# Patient Record
Sex: Female | Born: 1945 | Race: White | Hispanic: No | Marital: Married | Smoking: Never smoker
Health system: Southern US, Community
[De-identification: ages and names within clinical notes are randomized; demographics above are authoritative.]

## PROBLEM LIST (undated history)

## (undated) DIAGNOSIS — I10 Essential (primary) hypertension: Secondary | ICD-10-CM

## (undated) DIAGNOSIS — Z9119 Patient's noncompliance with other medical treatment and regimen: Secondary | ICD-10-CM

## (undated) DIAGNOSIS — K3532 Acute appendicitis with perforation and localized peritonitis, without abscess: Secondary | ICD-10-CM

## (undated) DIAGNOSIS — E119 Type 2 diabetes mellitus without complications: Secondary | ICD-10-CM

## (undated) DIAGNOSIS — D509 Iron deficiency anemia, unspecified: Secondary | ICD-10-CM

## (undated) DIAGNOSIS — E7800 Pure hypercholesterolemia, unspecified: Secondary | ICD-10-CM

## (undated) DIAGNOSIS — Z992 Dependence on renal dialysis: Secondary | ICD-10-CM

## (undated) DIAGNOSIS — N289 Disorder of kidney and ureter, unspecified: Secondary | ICD-10-CM

## (undated) DIAGNOSIS — K219 Gastro-esophageal reflux disease without esophagitis: Secondary | ICD-10-CM

## (undated) DIAGNOSIS — N390 Urinary tract infection, site not specified: Secondary | ICD-10-CM

## (undated) DIAGNOSIS — Z91199 Patient's noncompliance with other medical treatment and regimen due to unspecified reason: Secondary | ICD-10-CM

## (undated) DIAGNOSIS — L988 Other specified disorders of the skin and subcutaneous tissue: Secondary | ICD-10-CM

## (undated) HISTORY — PX: ABDOMINAL HYSTERECTOMY: SHX81

## (undated) HISTORY — PX: OTHER SURGICAL HISTORY: SHX169

## (undated) HISTORY — PX: TONSILLECTOMY: SUR1361

## (undated) SURGERY — Surgical Case
Anesthesia: *Unknown

---

## 2000-12-26 ENCOUNTER — Ambulatory Visit (HOSPITAL_BASED_OUTPATIENT_CLINIC_OR_DEPARTMENT_OTHER): Admission: RE | Admit: 2000-12-26 | Discharge: 2000-12-26 | Payer: Self-pay | Admitting: *Deleted

## 2003-07-24 ENCOUNTER — Emergency Department (HOSPITAL_COMMUNITY): Admission: EM | Admit: 2003-07-24 | Discharge: 2003-07-24 | Payer: Self-pay | Admitting: Emergency Medicine

## 2006-02-03 ENCOUNTER — Emergency Department (HOSPITAL_COMMUNITY): Admission: EM | Admit: 2006-02-03 | Discharge: 2006-02-03 | Payer: Self-pay | Admitting: Emergency Medicine

## 2006-02-09 ENCOUNTER — Encounter (HOSPITAL_COMMUNITY): Admission: RE | Admit: 2006-02-09 | Discharge: 2006-03-11 | Payer: Self-pay | Admitting: Orthopaedic Surgery

## 2006-02-11 ENCOUNTER — Emergency Department (HOSPITAL_COMMUNITY): Admission: EM | Admit: 2006-02-11 | Discharge: 2006-02-11 | Payer: Self-pay | Admitting: *Deleted

## 2006-03-13 ENCOUNTER — Encounter (HOSPITAL_COMMUNITY): Admission: RE | Admit: 2006-03-13 | Discharge: 2006-04-12 | Payer: Self-pay | Admitting: Orthopaedic Surgery

## 2006-05-01 HISTORY — PX: APPENDECTOMY: SHX54

## 2007-03-16 ENCOUNTER — Inpatient Hospital Stay (HOSPITAL_COMMUNITY): Admission: EM | Admit: 2007-03-16 | Discharge: 2007-04-15 | Payer: Self-pay | Admitting: Emergency Medicine

## 2007-03-22 ENCOUNTER — Encounter (INDEPENDENT_AMBULATORY_CARE_PROVIDER_SITE_OTHER): Payer: Self-pay | Admitting: General Surgery

## 2007-05-02 HISTORY — PX: OTHER SURGICAL HISTORY: SHX169

## 2007-06-24 ENCOUNTER — Ambulatory Visit (HOSPITAL_COMMUNITY): Admission: RE | Admit: 2007-06-24 | Discharge: 2007-06-24 | Payer: Self-pay | Admitting: General Surgery

## 2008-01-01 IMAGING — CT CT ABDOMEN W/ CM
1 of 3 series · 12 of 32 positions shown, 18 images · IV contrast (Omnipaque 300)
Comparison: 04/01/2007

CLINICAL DATA: 61-year-old female with anemia, urinary tract infection. 
 ABDOMEN CT WITH CONTRAST:
TECHNIQUE: Multidetector CT imaging of the abdomen was performed following the standard protocol during bolus administration of intravenous contrast.
 Contrast:  100 cc Omnipaque 300

[Series 2: abd_pel 5.0 b40f · axial · 0.74mm/px · z∈[-322,-52]mm · 12 of 64 slices shown, 18 images]
[im 5/64  soft-tissue]
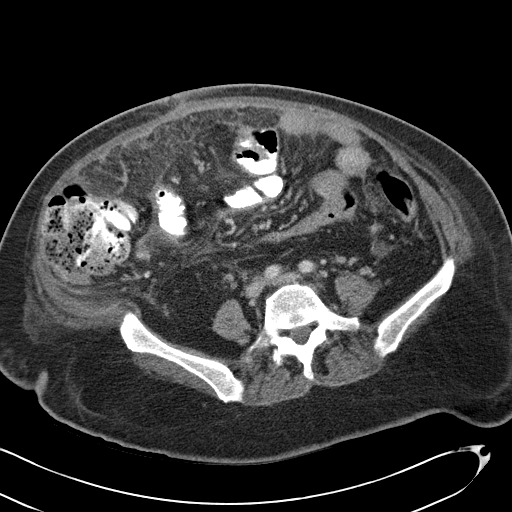
[im 5/64  bone]
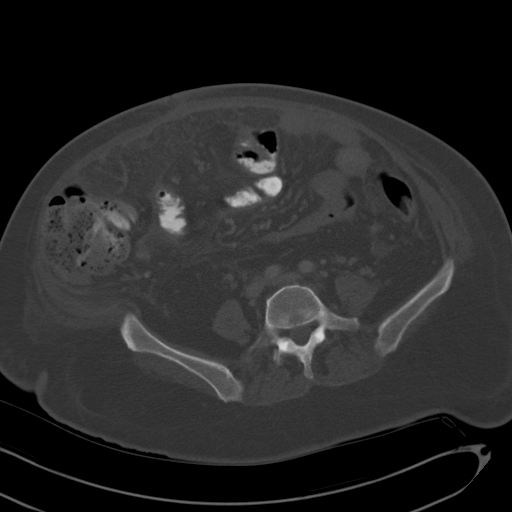
[im 10/64  soft-tissue]
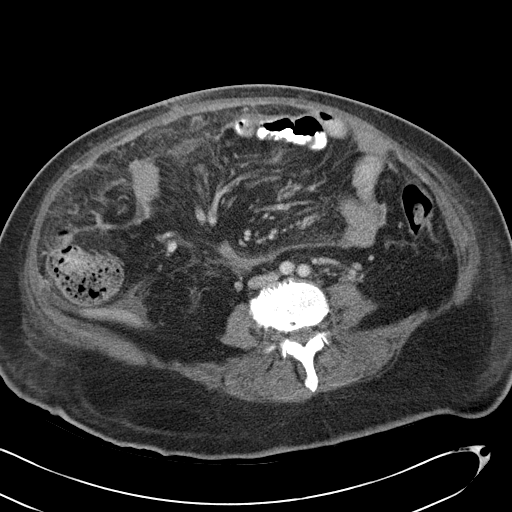
[im 14/64  soft-tissue]
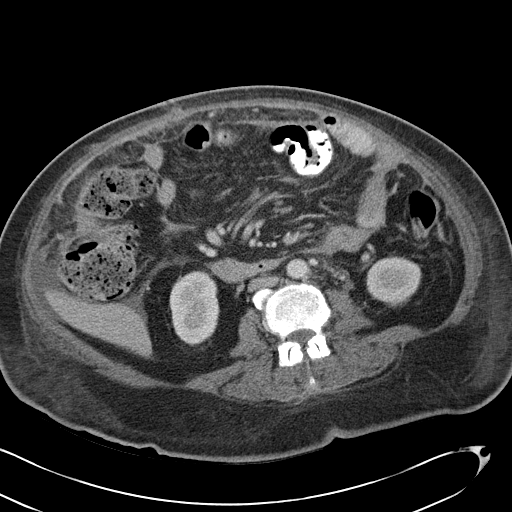
[im 19/64  soft-tissue]
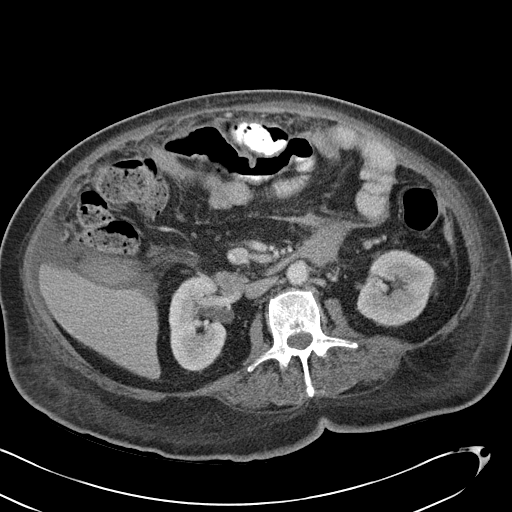
[im 23/64  soft-tissue]
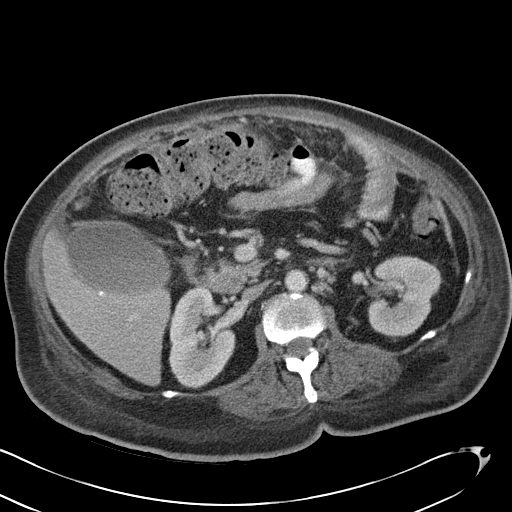
[im 28/64  soft-tissue]
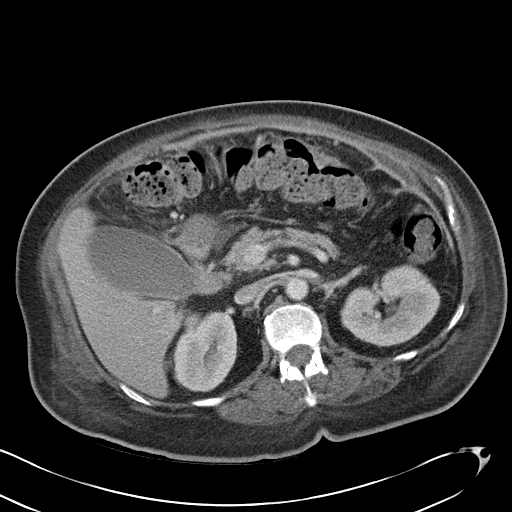
[im 37/64  soft-tissue]
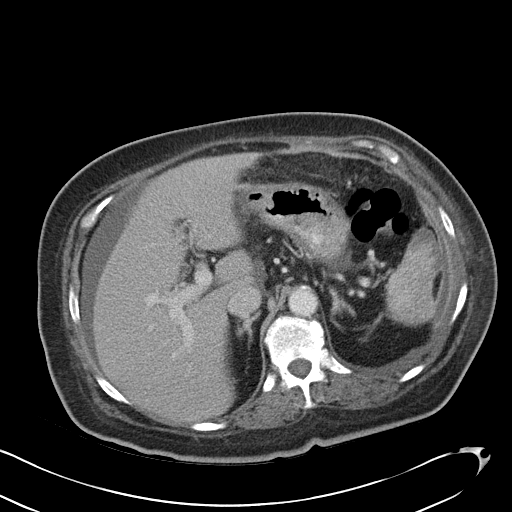
[im 41/64  soft-tissue]
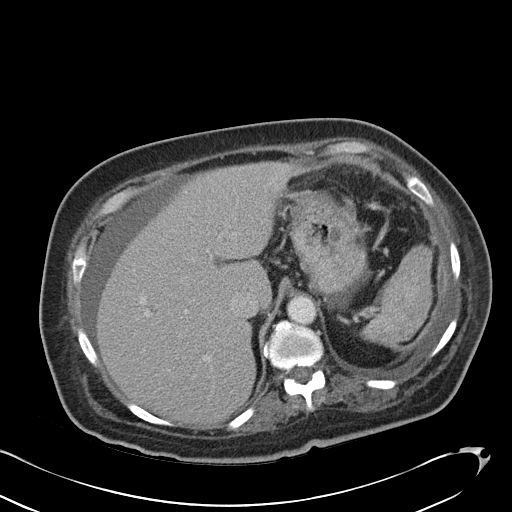
[im 46/64  soft-tissue]
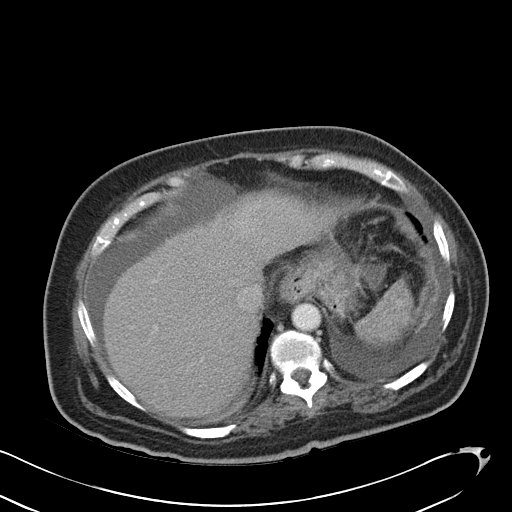
[im 46/64  lung]
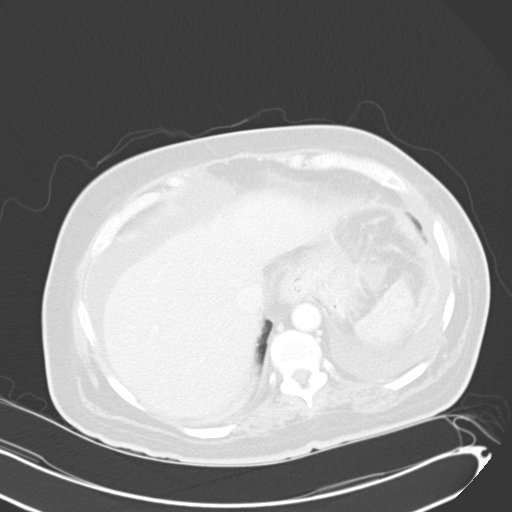
[im 46/64  bone]
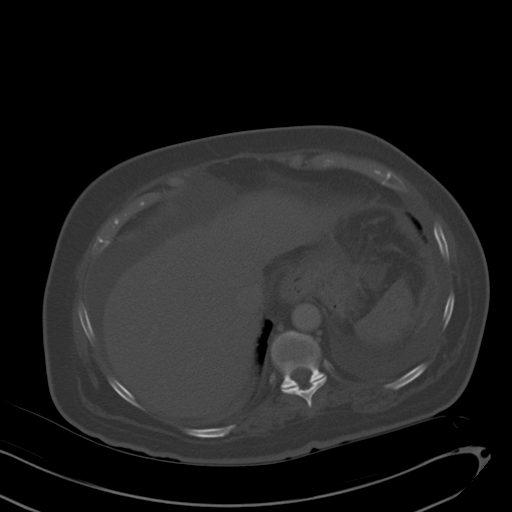
[im 50/64  soft-tissue]
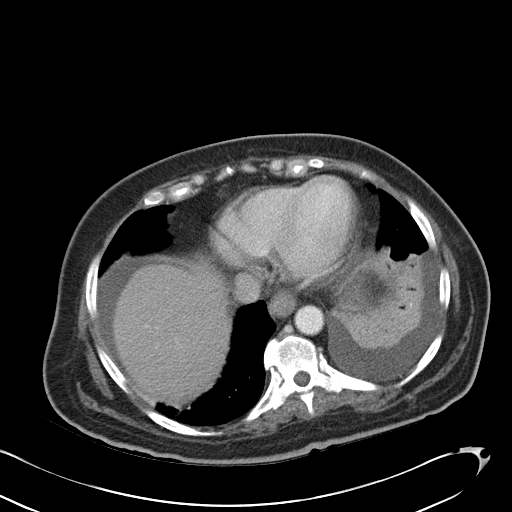
[im 50/64  lung]
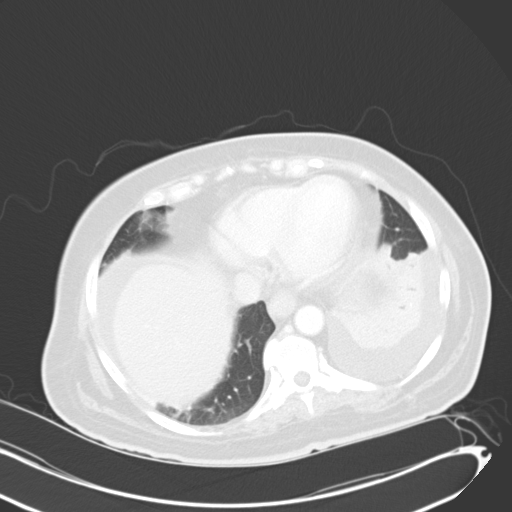
[im 55/64  soft-tissue]
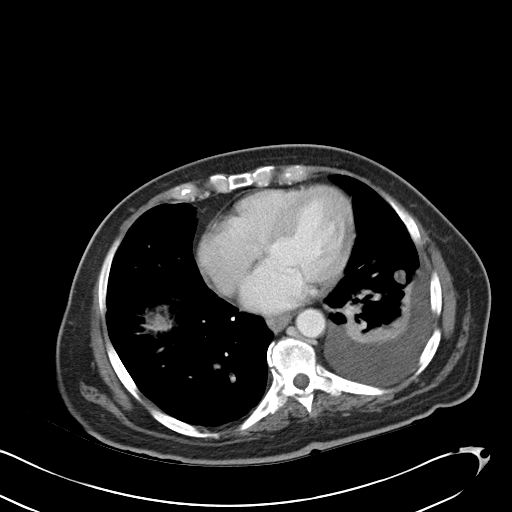
[im 55/64  lung]
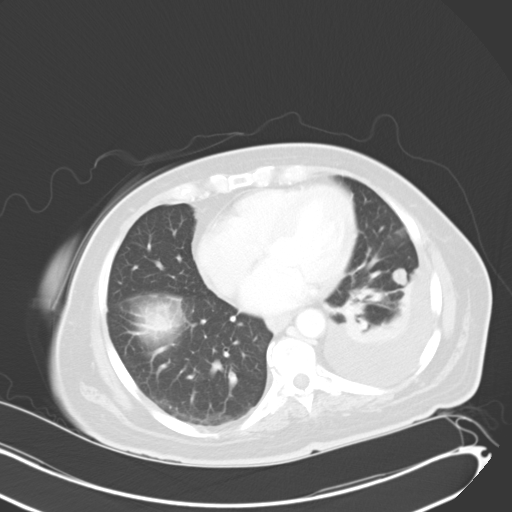
[im 59/64  soft-tissue]
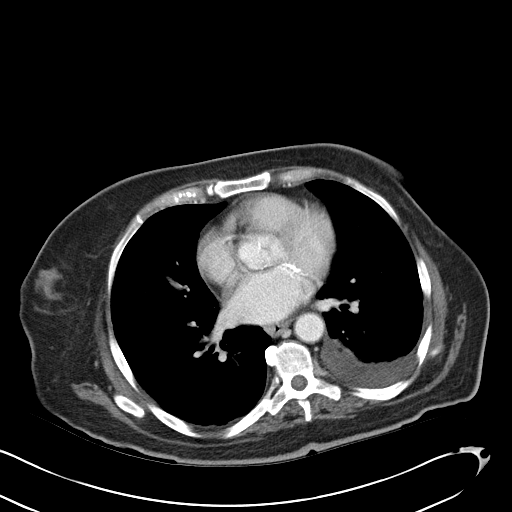
[im 59/64  lung]
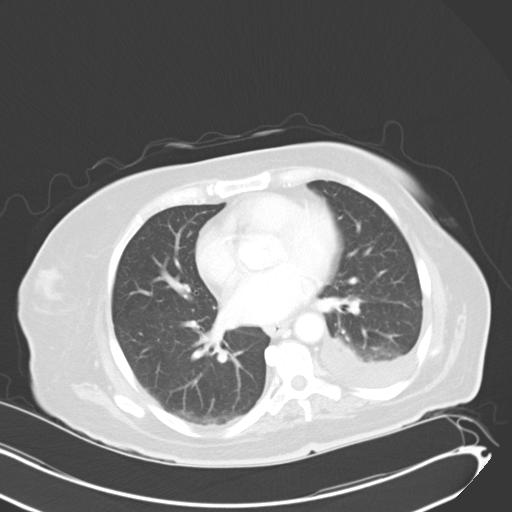

[12 of 32 positions shown; findings below may reference images not displayed]

FINDINGS: Mildly increased layering left pleural effusion with associated left lower lobe passive atelectasis/consolidation.  A lateral left lower lobe lung nodule is unchanged measuring up to 12-13 mm.  There is a trace right pleural effusion with minor right lower lobe atelectasis.  Increased ascites is seen.  This is predominantly located along the liver.  Probable hepatic calcified granuloma adjacent to the gallbladder fossa is unchanged.   Otherwise, liver parenchyma is stable.  The rim enhancing left pelvic fluid collections have further diminished.  Hypodense wedge-shaped areas in the spleen are less conspicuous compatible with evolution of splenic infarcts.  There is a small amount of fluid in the gastrosplenic ligament, which has also decreased in overall volume since the prior exam.  Stable atrophic pancreas.  Normal adrenal glands.  Normal kidneys.   The gallbladder has increased in size measuring 4.9 cm in diameter, the upper limits of normal. No biliary ductal dilatation is seen.   Diffuse mesenteric stranding and mesenteric haziness has increased throughout the anterior abdomen, primarily along the course of the colon.  Oral contrast has transited to the cecum.  No definite findings of bowel obstruction are seen.   The distal loops in the pelvis are not evaluated.  Multilevel degenerative changes in the spine.  No suspicious osseous lesion.  The major vascular structures including the portal venous system are patent.
IMPRESSION: Increased      left pleural effusion.  Stable left      lung base nodule.
  Increased      perihepatic ascites.  Decreased      perisplenic and gastrohepatic ligament fluid collections. 
  Increased      mesenteric congestion and inflammation, nonspecific.
  Mildly      distended gallbladder. 
  Decreased      conspicuity of splenic infarcts.

## 2010-09-13 NOTE — Group Therapy Note (Signed)
NAMEMarland Kitchen  Shelby, Foster                ACCOUNT NO.:  [id number]   MEDICAL RECORD NO.:  [id number]          PATIENT TYPE:  INP   LOCATION:  A318                          FACILITY:  [facility]   PHYSICIAN:  Angus G. Renard Matter, MD   DATE OF BIRTH:  09-Nov-1945   DATE OF PROCEDURE:  04/13/2007  DATE OF DISCHARGE:                                 PROGRESS NOTE   This patient continues to recover from appendectomy, ruptured appendix,  and pneumoperitoneum.  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 The patient did have extremely low hemoglobin  and hematocrit yesterday; hemoglobin was 8.7, hematocrit was 25.6.  Following transfusion, hemoglobin was 9.5, hematocrit 28.2. Anemia panel  shows extremity low serum iron 14, iron-binding capacity 72.  Her WBC  was 18,500.   A repeat CT of abdomen was ordered, report not available at this  dictation   OBJECTIVE:  VITAL SIGNS:  Blood pressure 121/69, respiration 20, pulse  92, temperature 98.1; O2Sat 94-96.  Blood sugars ranged from 92-137.  LUNGS:  Clear.  HEART:  Regular rhythm.  ABDOMEN:  No palpable organs or masses.  The patient has umbilical  hernia   ASSESSMENT AND PLAN:  1. The patient does have a urinary tract infection, and we have      changed drugs to nitrofurantoin.  2. We did transfuse the patient with a unit of blood.  Will repeat CBC      today, Hemoccult all stool.  3. Continue current regimen.  4. Await additional report on CT of abdomen.      Angus G. Renard Matter, MD  Electronically Signed     AGM/MEDQ  D:  04/13/2007  T:  04/14/2007  Job:  [id number]

## 2010-09-13 NOTE — Group Therapy Note (Signed)
NAMEMarland Kitchen  [name], [name]                ACCOUNT NO.:  [id number]   MEDICAL RECORD NO.:  [id number]          PATIENT TYPE:  INP   LOCATION:  A318                          FACILITY:  [facility]   PHYSICIAN:  Angus G. Renard Matter, MD   DATE OF BIRTH:  22-Jul-1945   DATE OF PROCEDURE:  DATE OF DISCHARGE:                                 PROGRESS NOTE   This patient continues to recover from appendectomy, ruptured appendix,  and pneumoperitoneum. She does have insulin-dependent diabetes and renal  insufficiency. She had a urinary tract infection secondary to  Citrobacter-freundii, and wound infection which cultured multi-  organisms. The patient has ongoing elevation of her white blood count  and marked anemia. The anemia is thought secondary to an iron-deficiency  state. Her most recent white blood count is 18100 with hemoglobin 10.5,  hematocrit 31.3 following transfusion. The patient did have a recent CT  of the abdomen which shows increased left plural effusion, increased  peri-hepatic ascites, mesenteric congestion and inflammation, mildly  distended gallbladder, and increased ascites.   OBJECTIVE:  VITAL SIGNS:  Blood pressure 152/84, respiration 20, pulse  97, temperature 98.4. Blood sugars range from 85 to 118.  LUNGS:  Diminished breath sounds.  HEART:  Irregular rhythm.  ABDOMEN:  Slightly distended.   PLAN:  1. Continue supportive care.  2. Continue to monitor CBC and BMET.      Angus G. Renard Matter, MD  Electronically Signed     AGM/MEDQ  D:  04/14/2007  T:  04/15/2007  Job:  [id number]

## 2010-09-13 NOTE — Group Therapy Note (Signed)
NAMEMarland Kitchen  [name], [name]                ACCOUNT NO.:  [id number]   MEDICAL RECORD NO.:  [id number]          PATIENT TYPE:  INP   LOCATION:  A209                          FACILITY:  [facility]   PHYSICIAN:  Angus G. McInnis, MD   DATE OF BIRTH:  1945/06/05   DATE OF PROCEDURE:  DATE OF DISCHARGE:                                 PROGRESS NOTE   This patient was admitted through the ED with abdominal pain which began  approximately 1 week prior to admission located in the left lower  abdomen, dull aching pain, moderate to severe. She also has diabetes and  possible early renal failure.  The patient did have elevated glucose on  admission of 489 and was thought to have a urinary tract infection. Her  sugars in the range of 279 and 313 now. She remains on IV saline and  sliding scale NovoLog insulin.   OBJECTIVE:  VITAL SIGNS:  Blood pressure 102/62, respiration 18, pulse  106, temperature 99.1  LUNGS:  Clear.  HEART:  Regular rhythm.  ABDOMEN:  No palpable organs or masses.   ASSESSMENT:  The patient was admitted with lower abdominal pain,  markedly elevated glucose, mildly elevated BUN and creatinine and what  was felt to be urinary tract infection.   PLAN:  Continue current regimen.  Will proceed with radiologic studies  sent to CT if needed tomorrow.      Angus G. Renard Matter, MD  Electronically Signed     AGM/MEDQ  D:  03/17/2007  T:  03/18/2007  Job:  [id number]

## 2010-09-13 NOTE — Group Therapy Note (Signed)
NAMEMarland Kitchen  Shelby, Foster                ACCOUNT NO.:  [id number]   MEDICAL RECORD NO.:  [id number]          PATIENT TYPE:  INP   LOCATION:  A202                          FACILITY:  [facility]   PHYSICIAN:  Angus G. Renard Matter, MD   DATE OF BIRTH:  [birth year]   DATE OF PROCEDURE:  DATE OF DISCHARGE:                                 PROGRESS NOTE   This patient continues to recover from appendectomy for ruptured  appendix and abscess and pneumoperitoneum.  The patient does have  insulin-dependent diabetes, renal insufficiency.  Has had some problems  voiding.  A catheter was inserted.  Most recent CBC WBC 18,300 with  hemoglobin 9.7.   OBJECTIVE:  VITAL SIGNS:  Blood pressure 112/66, respiration 18, pulse  86, temperature 98.8.  Blood sugars range from 86-127.  The patient does  have negative fluid balance 975.  HEART:  Regular rhythm.  LUNGS:  Diminished breath sounds and lower lobes.  ABDOMEN:  Slight tenderness around incision.   ASSESSMENT:  The patient is recovering from ruptured appendix.  She does  have diabetes.  Fairly well-controlled.  Has had some problems with  voiding.  Has a catheter in place and also elevated white count.   PLAN:  To continue current regimen.      Angus G. Renard Matter, MD  Electronically Signed     AGM/MEDQ  D:  04/03/2007  T:  04/03/2007  Job:  [id number]

## 2010-09-13 NOTE — Group Therapy Note (Signed)
NAMEMarland Kitchen  [name], [name]                ACCOUNT NO.:  [id number]   MEDICAL RECORD NO.:  [id number]          PATIENT TYPE:  INP   LOCATION:  A209                          FACILITY:  [facility]   PHYSICIAN:  Angus G. Renard Matter, MD   DATE OF BIRTH:  [birth year]   DATE OF PROCEDURE:  03/20/2007  DATE OF DISCHARGE:                                 PROGRESS NOTE   This patient continues to have some slight lower abdominal pain.  She  remains on  NG suction, intravenous fluids. Was admitted to hospital  with lower abdominal pain.  She does have poorly controlled diabetes  which is improving; early renal failure.  Continues to remain n.p.o. She  does have x-ray evidence of small bowel perforation and/or ulcer. Is  being seen by surgical service.   Pertinent laboratory data: CBC: WBC 17,600, hemoglobin 11, hematocrit  32.  Chemistries: Sodium of 132, potassium 3.6, chloride 102, CO2 16,  glucose 287, BUN 42, creatinine 1.53.   OBJECTIVE:  VITAL SIGNS:  Blood pressure 118/65, respiration 18, pulse  109, temperature 96.9, blood sugars range 284-343.  LUNGS:  Clear.  HEART:  Regular rhythm.  ABDOMEN:  His abdomen is slightly distended.  The patient does have an  umbilical hernia.   ASSESSMENT:  The patient does have x-ray evidence of small bowel  perforation and/or ulcer with perforation,  pneumoperitoneum, does have  poorly controlled diabetes, urinary tract infection, hyperglycemia.   PLAN:  To continue to monitor blood sugars, continue to increase dosage  of the basal insulin, continue IV fluids, continue to monitor BMET, CBC.  The patient is being followed by surgical service.      Angus G. Renard Matter, MD  Electronically Signed     AGM/MEDQ  D:  03/20/2007  T:  03/20/2007  Job:  [id number]

## 2010-09-13 NOTE — Group Therapy Note (Signed)
NAMEMarland Foster  GICELA, SCHWARTING                ACCOUNT NO.:  [id number]   MEDICAL RECORD NO.:  [id number]          PATIENT TYPE:  INP   LOCATION:  A318                          FACILITY:  [facility]   PHYSICIAN:  Angus G. Renard Matter, MD   DATE OF BIRTH:  [birth year]   DATE OF PROCEDURE:  DATE OF DISCHARGE:                                 PROGRESS NOTE   This patient continues to recover from appendectomy, ruptured appendix  and pneumoperitoneum.  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  She has an ongoing urinary tract infection  secondary to Citrobacter - Freundii and wound infection, cultured  multiple organisms. The patient continues to have elevated white count  16,300 with hemoglobin 10.8, hematocrit 32.3. She still has indwelling  catheter and iron-deficiency anemia with hemoglobin 10.8, hematocrit  32.3.   OBJECTIVE:  VITAL SIGNS:  Blood pressure 147/83, respiration 20, pulse  96, temperature 97.  LUNGS:  Clear, diminished breath sounds lower lobe.  HEART:  Irregular rhythm.  ABDOMEN:  No palpable organs or masses.  The patient has healing  incision lower abdomen:.   ASSESSMENT:  The patient continues to improve.  She does have  perihepatic ascites, mildly distended gallbladder.   PLAN:  To continue to continue current regimen.  Will have discharge  planners talk to the patient concerning further care, possibly rehab.      Angus G. Renard Matter, MD  Electronically Signed     AGM/MEDQ  D:  04/15/2007  T:  04/15/2007  Job:  [id number]

## 2010-09-13 NOTE — Group Therapy Note (Signed)
NAMEMarland Foster  KRISTON, MCKINNIE                ACCOUNT NO.:  [id number]   MEDICAL RECORD NO.:  [id number]          PATIENT TYPE:  INP   LOCATION:  A202                          FACILITY:  [facility]   PHYSICIAN:  Angus G. Renard Matter, MD   DATE OF BIRTH:  15-Jul-1945   DATE OF PROCEDURE:  03/30/2007  DATE OF DISCHARGE:                                 PROGRESS NOTE   This patient is recovering from appendectomy for ruptured appendix,  abscess and pneumoperitoneum.  Does have the problem of insulin  dependent diabetes and renal insufficiency.  These problems are  improving, however, she did have episodes of low blood sugar driven by  her insulin.  Dosage was adjusted.  Her blood sugars have remained more  normal.   OBJECTIVE:  VITAL SIGNS:  Blood pressure 130/76, respirations 22, pulse  99, temperature 97.1.  CBC white count trending down, white count 16.8.  hemoglobin 9.1, hematocrit 26.6.  Chemistry is otherwise normal.  LUNGS:  Clear.  HEART:  Regular rhythm.  ABDOMEN:  Slight tenderness over the lower abdomen but especially around  incision.  Hyperactive bowel sounds.  The patient can still produce  urine.   ASSESSMENT:  The patient is recovering from surgical repair of ruptured  appendix.  Continues to have problems with blood sugars but sugars now  are improving.  Urine output seems to be better.  Overall status seems  to be improved.      Angus G. Renard Matter, MD  Electronically Signed     AGM/MEDQ  D:  03/30/2007  T:  03/30/2007  Job:  [id number]

## 2010-09-13 NOTE — Group Therapy Note (Signed)
NAMEMarland Kitchen  KAITLIN, ALCINDOR                ACCOUNT NO.:  [id number]   MEDICAL RECORD NO.:  [id number]          PATIENT TYPE:  INP   LOCATION:  A202                          FACILITY:  [facility]   PHYSICIAN:  Angus G. Renard Matter, MD   DATE OF BIRTH:  Jan 05, 1946   DATE OF PROCEDURE:  04/07/2007  DATE OF DISCHARGE:                                 PROGRESS NOTE   HISTORY OF PRESENT ILLNESS:  The patient continues to recover from  appendectomy for ruptured appendix, abscess, and pneumoperitoneum. She  does have an ongoing problem with insulin-dependent diabetes and renal  insufficiency. She remains on catheter drainage. She had been having an  anemia and was transfused. She is on oral iron now.   OBJECTIVE:  VITAL SIGNS:  Blood pressure 115/71, respiratory rate 18,  pulse 94, temperature 98.2.   LABORATORY DATA:  Blood sugars have ranged from 94 to 224. Hemoglobin  9.6, hematocrit 28.4.      Angus G. Renard Matter, MD  Electronically Signed     AGM/MEDQ  D:  04/07/2007  T:  04/07/2007  Job:  [id number]

## 2010-09-13 NOTE — Op Note (Signed)
NAMEMarland Foster  PATRECIA, Foster                ACCOUNT NO.:  [id number]   MEDICAL RECORD NO.:  [id number]          PATIENT TYPE:  INP   LOCATION:  A209                          FACILITY:  [facility]   PHYSICIAN:  Tilford Pillar, MD      DATE OF BIRTH:  1945/06/03   DATE OF PROCEDURE:  03/22/2007  DATE OF DISCHARGE:                               OPERATIVE REPORT   PREOPERATIVE DIAGNOSES:  Perforated viscus.   POSTOPERATIVE DIAGNOSES:  Perforated gangrenous appendicitis.   PROCEDURE:  1. Exploratory laparotomy with open appendectomy.  2. Intraabdominal drain placement with a 10 flat Blake drains x2  3. Simple closure of umbilical hernia.  4. Left subclavian vein triple lumen catheter placement (post      induction prior to primary operation).   SURGEON:  Tilford Pillar, MD.   ANESTHESIA:  General endotracheal.   ESTIMATED BLOOD LOSS:  150 mL.   URINE OUTPUT:  1700 mL.   FLUID:  1650 mL of crystalloid.   SPECIMENS:  Gangrenous appendix.   INDICATIONS FOR PROCEDURE:  The patient is a 65 year old female with a  history of diabetes mellitus who presented approximately 6 days ago with  abdominal pain. This was described initially mostly in her right side  with migration to her left side. She denied any prior symptomatology.  She was admitted for workup. During her initial management, her  symptomatology improved. She continued to progress without any symptoms  however, on CT evaluation of the abdomen there was significant fluid  collections noted throughout the abdomen. These were suspicious for  intraabdominal abscesses with the etiology of a likely perforated  viscus. Despite having symptoms or any pain symptomatology, it was  recommended that she undergo exploratory laparotomy. The risks,  benefits, and alternatives of exploratory laparotomy, possible bowel  resection and possible ostomy were discussed at length with the patient  and the patient's family. Their questions and concerns were  addressed  and the patient was consented for the planned procedure.   DESCRIPTION OF PROCEDURE:  The patient was taken to the operating room  and was placed in the supine position on the operating table at which  time she was given a general anesthetic. When the patient was asleep,  she was endotracheally intubated. At this point, her left chest and neck  were prepped and draped for placement of a central venous catheter.  Using an 18-gauge introducer needle, the left subclavian vein was  identified. Good venous return was obtained. A wire was introduced.  Using Seldinger technique at this point, a triple lumen catheterization  was inserted over the wire in sterile fashion to 18 cm. The wire was  removed. All ports were easily flushed and aspirated. The catheter was  secured to the skin with 3 times silk sutures. At this point, a chest x-  ray was obtained prior to continuation. This demonstrated no evidence of  any pneumothorax and good positioning of the catheter. At this point,  the patient's abdomen was prepped and draped for the planned procedure.   A midline incision was created with the scalpel with a right keyhole  defect around the umbilicus in addition to dissection down to the  subcuticular tissue was carried out using electrocautery. The anterior  fascia was divided and elevated and grasped with Kocher clamps. This was  elevated and entrance into the peritoneum was obtained. The incision was  elongated superiorly and inferiorly. At this point, the incarcerated  umbilical hernia was identified and the incarcerated omental fat was  reduced back into the abdominal cavity. At this point initial  exploration was begun. There was evidence of purulence throughout the  abdomen. This was sent as cultures for anaerobic and aerobic cultures  and sensitivity. With continued dissection, the omentum was eventually  freed from the underlying small bowel. Several interloop abscesses were   encountered during the gentle manipulation of the small intestines.  These were followed back to the ligament of Treitz and then run towards  the cecum. At this point as there seemed to be a significant amount of  purulence within the left lateral abdomen, the descending colon was  identified and the left pericolic gutter was identified. There was a  significant amount of purulence within this area and this was aspirated  continuing up towards the splenic flexure. Although the majority of  bowel both small and large encountered at this point did appear inflamed  and indurated, there was no evidence of any ischemic changes or any  areas of suspicious leak. At this point continued our dissection and  mobilization of small bowel looking at the right colon and cecum. At  this point, the gangrenous structure was encountered and with closer  observation it was identified this was clearly in the position of the  appendix coming off the base of the cecum. The surrounding pus was  aspirated. The base of the gangrenous appendix was identified. There was  a small area of viable base noted which a window was created behind  between the appendix and the mesoappendix. A Kelly clamp was placed  across the mesoappendix which was then divided. Prior to our removal of  the appendix, a pursestring suture was placed around the cecum at the  point of appendiceal insertion with a 2-0 silk. With this suture in  position, the base of the appendix was divided using Metzenbaum  scissors. The appendix was placed on the back table and was sent as a  permanent specimen to pathology. The pursestring suture was secured. The  appendiceal lumen of the base of the appendix was identified. The  mucosal tissue of this was cauterized with the Bovie and then the cecum  was imbricated over the remnant with interrupted 2-0 silk sutures. With  the appendiceal remnant imbricated, attention was then turned to the  mesoappendix.  This was suture ligated with a 2-0 silk stick tie.  Hemostasis was good and at this point, evaluation of the greater abdomen  was continued. No other abnormalities were identified. The NG tube  placement was confirmed within gastric placement at this time and  attention was turned to copious irrigation.   At this point, all 4 quadrants were irrigated with a copious amount of  warm saline with the final irrigation including gentamicin. This was  aspirated. The small bowel was then replaced back into the abdomen again  confirming good hemostasis. A piece of omentum was pulled down to the  area of the cecum and was gently placed over the area of the suture  imbrication line. At this point, the drains were placed through two  separate stab incisions in the right  and left lower abdominal wall. A 10  flat Blake drain was placed in the right pericolic gutter along the  cecum down into the pelvis and a 10 flat Blake drain was placed into the  left pericolic gutter. These drains were sutured to the skin with a 2-0  nylon suture. At this point, a #0 Ethibond suture was brought to the  field and was utilized to reapproximate the fascial defect at the  umbilicus of the umbilical hernia defect. With this closed, attention  was turned to closure of the anterior abdominal wall fascia. This was  closed with a #0 looped Novofil suture x2. The first started superiorly  and was brought below the umbilicus. The second was started anteriorly  and was brought to the first. These were secured to each other and then  buried into the subcuticular tissue. The wound was then irrigated again  with gentamicin irrigation and then skin staples were utilized to  reapproximate the skin edges. The skin was washed and dried with a moist  and dry towel. Sterile dressings were placed. The drapes were removed,  the dressings  were secured and the patient was transferred over to a hospital bed. She  was transferred to the  post anesthesia care unit in stable condition  with plans for transfer to the intensive care unit for close monitoring.  At the conclusion of the procedure, all instrument, sponge and needle  counts were correct. The patient tolerated the procedure well.      Tilford Pillar, MD  Electronically Signed     BZ/MEDQ  D:  03/22/2007  T:  03/23/2007  Job:  [id number]   cc:   Angus G. Renard Matter, MD  Fax: [phone]

## 2010-09-13 NOTE — Group Therapy Note (Signed)
NAMEMarland Kitchen  [name], [name]                ACCOUNT NO.:  [id number]   MEDICAL RECORD NO.:  [id number]          PATIENT TYPE:  INP   LOCATION:  A202                          FACILITY:  [facility]   PHYSICIAN:  Angus G. Renard Matter, MD   DATE OF BIRTH:  Jul 14, 1945   DATE OF PROCEDURE:  04/06/2007  DATE OF DISCHARGE:                                 PROGRESS NOTE   SUBJECTIVE:  This patient continues to recover from appendectomy for  ruptured appendix, abscess and pneumoperitoneum.  She does have ongoing  insulin-dependent diabetes and renal insufficiency.  She remains on  catheter drainage.  The patient had blood transfusion yesterday because  of low hemoglobin.  She does have iron deficiency.  Her current  hemoglobin/hematocrit hemoglobin 9.6, hematocrit 28.4.  anemia panel  showed evidence of iron deficiency C-difficile toxin was negative.  The  patient remains fairly comfortable.   OBJECTIVE:  VITAL SIGNS:  Blood pressure 125/71, respirations 18, pulse  97, temperature 99.1.  sugars range from 127-190.  HEART:  Regular  rhythm.  LUNGS:  Clear to P&A.  ABDOMEN:  No palpable organs or masses.  Does have umbilical hernia midabdomen.   ASSESSMENT:  The patient recovering from above-stated problems.   PLAN:  Continue gradual ambulation.  The patient will be placed in  nursing facility first week for further recovery.      Angus G. Renard Matter, MD  Electronically Signed     AGM/MEDQ  D:  04/06/2007  T:  04/07/2007  Job:  [id number]

## 2010-09-13 NOTE — Group Therapy Note (Signed)
NAMEMarland Kitchen  Foster, [name]                ACCOUNT NO.:  [id number]   MEDICAL RECORD NO.:  [id number]          PATIENT TYPE:  INP   LOCATION:  A202                          FACILITY:  [facility]   PHYSICIAN:  Shelby G. Renard Matter, MD   DATE OF BIRTH:  1945/05/16   DATE OF PROCEDURE:  DATE OF DISCHARGE:                                 PROGRESS NOTE   This patient is in her fourth postop day following surgery by Dr.  Lovell Foster.  The patient had a ruptured appendix with abscess formation.  She had problems yesterday with diminished urinary output.  She was  continued on intravenous fluids and given IV Lasix and still has  negative fluid balance of 386.  The patient remains on NG suction.  She  is being seen by Dr. Kristian Foster as well.   LABORATORY DATA:  CBC, WBC 29,400, hemoglobin 9.8, hematocrit 29.2.  Phosphorus 7.0, magnesium 2.3.  BMET this morning shows a sodium 40,  potassium of 5.5, chloride 111, CO2 24, glucose 54, BUN 57, creatinine  2.31.   PHYSICAL EXAMINATION:  VITAL SIGNS:  Blood pressure 164/88, respirations  19, pulse 97, temp 98.8, albumin low at 1.2.  HEART:  Regular rhythm.  LUNGS:  Clear to P and A.  ABDOMEN:  Slightly distended.  EXTREMITIES:  The patient has edema in extremities.   ASSESSMENT:  The patient is recovering from surgery for ruptured  appendix.  She does have diabetes, sugars seem to in under better  control.  She does have edema and diminished urinary output.  Low serum  albumin levels.  Plan to continue to challenge her kidneys with Lasix  and may need ultrasound of kidneys to assess possible obstructive  uropathy.      Shelby G. Renard Matter, MD  Electronically Signed     AGM/MEDQ  D:  03/26/2007  T:  03/26/2007  Job:  [id number]

## 2010-09-13 NOTE — Group Therapy Note (Signed)
NAMEMarland Foster  [name], [name]                ACCOUNT NO.:  [id number]   MEDICAL RECORD NO.:  [id number]          PATIENT TYPE:  INP   LOCATION:  A202                          FACILITY:  [facility]   PHYSICIAN:  Angus G. Renard Matter, MD   DATE OF BIRTH:  July 30, 1945   DATE OF PROCEDURE:  04/10/2007  DATE OF DISCHARGE:                                 PROGRESS NOTE   this patient continues to recover from appendectomy for a ruptured  appendix and pneumoperitoneum.  She does have insulin-dependent diabetes  and renal insufficiency.  She remains on catheter drainage and has an  elevated white count.  Recent CBC:  WBC 18,200 with a hemoglobin of 9.5,  hematocrit 28.4.  Recent urine showed 21-50 WBCs, many bacteria.   OBJECTIVE:  VITAL SIGNS:  Blood pressure 122/69, respirations 18, pulse  96, temperature 98.7, blood sugars range from 92-166.  LUNGS:  Diminished breath sounds bilaterally.  HEART:  Regular rhythm.  ABDOMEN:  No palpable organs or masses, the patient has umbilical hernia  healing surgical incisions.   ASSESSMENT:  Patient is being followed for above-stated problems.  Goal  is to get her in rehab facility in [place].  The patient does have  wound infection which may be contributing to an elevated white count,  also a urinary tract infection.  Will obtain urinalysis for culture and  sensitivity and appropriate antibiotic covers for this.      Angus G. Renard Matter, MD  Electronically Signed     AGM/MEDQ  D:  04/10/2007  T:  04/10/2007  Job:  [id number]

## 2010-09-13 NOTE — H&P (Signed)
NAMEMarland Kitchen  [name], [name]                ACCOUNT NO.:  [id number]   MEDICAL RECORD NO.:  [id number]          PATIENT TYPE:  AMB   LOCATION:  DAY                           FACILITY:  [facility]   PHYSICIAN:  Tilford Pillar, MD      DATE OF BIRTH:  [birth year]   DATE OF ADMISSION:  DATE OF DISCHARGE:  LH                              HISTORY & PHYSICAL   CHIEF COMPLAINT:  Open wound on her abdomen.   HISTORY OF PRESENT ILLNESS:  Patient is a 65 year old female known to me  after presenting to [facility] with acute onset of abdominal  pain several months ago.  At that time, she went through an extensive  workup.  Actually had resolution of her symptomatology.  However,  continued to have persistent leukocytosis.  The evaluation of her  abdomen demonstrated free intraperitoneal fluid and at this time, it was  suspected there was a perforated viscus, likely a diverticular process.  However, at the time of her exploratory laparotomy, it was actually  discovered that she had a necrotic, gangrenous appendicitis.  Appendectomy was performed at that time.  Her recovery was slow but  relatively uneventful.  During her postoperative follow-up visits, she  was noted to have a wound infection.  This was treated appropriately for  the opening of the inferior aspect of her incision.  This healed without  any sequelae but during her outpatient visits, it was noted that she  started to have a breakdown of the middle portion of her wound closure.  On closer inspection, there was noted to be persistent inflammatory  tissue within this as well as a rim of necrotic, sloughing tissue.  This  was debrided several times in the office; however, due to its location,  it is suspected that this is consistent with the area where the fascial  closure knots would be present.  She has no pain, no fevers or chills.  No nausea.  She is tolerating a diet.  She is gaining weight.  She has  had minimal drainage from this area.   No fever and chills.   PAST MEDICAL HISTORY:  She does have a history of diabetes mellitus.   PAST SURGICAL HISTORY:  Positive for exploratory laparotomy and  appendectomy.   CURRENT MEDICATIONS:  She states that she is on an oral diabetic  medication, but she does not remember the name of it.  She is currently  taking this.  She is on no anticoagulation.  She is on no additional  medications.   ALLERGIES:  No known drug allergies.   SOCIAL HISTORY:  No tobacco, no alcohol use.   REVIEW OF SYSTEMS:  CONSTITUTIONAL:  Unremarkable.  EYES:  Unremarkable.  ENT:  Unremarkable.  PULMONARY:  Unremarkable.  CARDIOVASCULAR:  Unremarkable.  GASTROINTESTINAL:  As per HPI, otherwise unremarkable.  GENITOURINARY:  She did have difficulty with urination with a history of  frequent UTIs.  MUSCULOSKELETAL:  She does have a significant weakness  in her left lower extremity.  This has been persistent since her last  hospitalization, but she does feel as  though she is getting stronger,  she does have a brace on the left lower extremity to assist with a  plantar deflection of her foot.  NEURO:  Unremarkable.   PHYSICAL EXAMINATION:  Patient is a somewhat disheveled female in no  acute distress.  She is alert and oriented.  HEENT:  Scalp with no deformities.  No masses.  Pupils are equal, round  and reactive to light.  Extraocular movements are intact.  No  conjunctival pallor is noted.  Oral mucosa is pink.  NECK:  Trachea is midline.  No cervical lymphadenopathy is appreciated.  PULMONARY:  Unlabored respirations.  No wheezing.  She is clear to  auscultation.  CARDIOVASCULAR:  Regular rate and rhythm.  ABDOMEN:  Positive bowel sounds.  Abdomen is soft and nontender to  palpation.  No hernias are appreciated.  She does have a fullness around  the area of her umbilicus.  She had a small eschar at the inferior  aspect of her midline incision, which was removed in the office with  good healing  granulation tissue underneath.  In the mid portion of her  incision, she has significant erythematous tissue and granulation tissue  forming within the wound with a central area of necrotic tissue.  This  was also debrided in the office.  She has no pain or discomfort at the  site.  The pocket of this defect is approximately 1.5 cm deep x 1 cm in  diameter.  SKIN:  Warm and dry.   ASSESSMENT/PLAN:  Chronic midline wound.  At this point, it is suspected  that this is a stitch granuloma with poor healing secondarily to the  presence of the stitch material within the wound closure.  It was  recommended that a return trip to the operating room for local excision  be conducted to allow improved closure.  The patient's questions and  concerns were addressed, as were the risks, benefits and alternatives of  excision and wound closure.  The patient does wish to proceed and will  consent the patient for the planned procedure.      Tilford Pillar, MD  Electronically Signed     BZ/MEDQ  D:  06/14/2007  T:  06/14/2007  Job:  [id number]   cc:   Angus G. Renard Matter, MD  Fax: [phone]   [facility]  Fax: [phone]

## 2010-09-13 NOTE — Group Therapy Note (Signed)
NAMEMarland Kitchen  TED, LEONHART                ACCOUNT NO.:  [id number]   MEDICAL RECORD NO.:  [id number]          PATIENT TYPE:  INP   LOCATION:  A202                          FACILITY:  [facility]   PHYSICIAN:  Angus G. Renard Matter, MD   DATE OF BIRTH:  Feb 20, 1946   DATE OF PROCEDURE:  DATE OF DISCHARGE:                                 PROGRESS NOTE   This patient is 5 days postop following appendectomy for ruptured  appendix and range of abscess. She has also insulin-dependent diabetes  and renal insufficiency. She has responded to the intravenous Lasix and  albumin and adjustment of catheter. She had to  have some Kayexalate to  control potassium.  Ultrasound of kidneys showed distended bladder. No  evidence of right renal abnormalities or obstruction of ureters.   OBJECTIVE:  VITAL SIGNS:  Blood pressure was 141/70, respirations 20,  pulse 83, temperature 97.1, blood sugars range from 87-115.  LUNGS:  Diminished breath sounds bilaterally.  HEART:  Regular rhythm.  ABDOMEN:  Slightly distended.  Tenderness around the incision.   ASSESSMENT:  This patient had ruptured appendix with pneumoperitoneum  and subsequent surgery for appendectomy and drainage.  She does have  diabetes which is in better control.  She does have recent renal  insufficiency, possible acute tubular necrosis and hyperkalemia, severe  hypoalbuminemia.   PLAN:  Continue current intravenous fluids. Continue albumin, Lasix etc  as ordered.      Angus G. Renard Matter, MD  Electronically Signed     AGM/MEDQ  D:  03/27/2007  T:  03/27/2007  Job:  [id number]

## 2010-09-13 NOTE — Group Therapy Note (Signed)
NAMEMarland Kitchen  [name], [name]                ACCOUNT NO.:  [id number]   MEDICAL RECORD NO.:  [id number]          PATIENT TYPE:  INP   LOCATION:  A202                          FACILITY:  [facility]   PHYSICIAN:  Angus G. Renard Matter, MD   DATE OF BIRTH:  09-04-1945   DATE OF PROCEDURE:  DATE OF DISCHARGE:                                 PROGRESS NOTE   This patient was taken to surgery yesterday by Dr. Lovell Sheehan and was found  to have a ruptured appendix and surrounding infection.  Apparently had  appendectomy with drains inserted.  The patient appears to be more  alert, and is using  a PCA pump.  Her blood sugars have been and in 250-  300 range.   LABORATORY DATA:  CBC:  WBC 30,400, hemoglobin 9.7, hematocrit 28.8.  Chemistries:  Sodium 134, potassium 4, CO2 105, BUN 42, creatinine 1.19.   PHYSICAL EXAMINATION:  LUNGS:  Clear.  HEART:  Regular rhythm.  ABDOMEN:  The patient has some tenderness over lower abdomen around  surgical incision.   ASSESSMENT:  The patient is 1 day postop following appendectomy with  drainage of sites of infection.  The patient did have ruptured appendix.  She does have poorly controlled diabetes, urinary tract infection.   PLAN:  Continue current regimen and intravenous fluids and intravenous  antibiotics, NG suction.  Will continue to monitor her blood sugars with  q.6h. Accu-Cheks and sliding scale NovoLog insulin.      Angus G. Renard Matter, MD  Electronically Signed     AGM/MEDQ  D:  03/23/2007  T:  03/24/2007  Job:  [id number]

## 2010-09-13 NOTE — Group Therapy Note (Signed)
NAMEMarland Kitchen  [name], [name]                ACCOUNT NO.:  [id number]   MEDICAL RECORD NO.:  [id number]          PATIENT TYPE:  INP   LOCATION:  A202                          FACILITY:  [facility]   PHYSICIAN:  Angus G. McInnis, MD   DATE OF BIRTH:  1945/12/13   DATE OF PROCEDURE:  DATE OF DISCHARGE:                                 PROGRESS NOTE   __________   She did have a problem with diminished urinary output and generalized  edema but she has a positive fluid balance now and is feeling better.   OBJECTIVE:  VITAL SIGNS: Blood pressure is 124/65, respirations 18,  pulse 82, temperature 97.1.   LABORATORY STUDIES:  CBC: WBC is 17,200, hemoglobin 8.7, hematocrit  26.1. Chemistries: Sodium 141, potassium of 3.0, PCO2 of 33. Creatinine  is 0.6.   LUNGS: Clear.  ABDOMEN: Tenderness over the lower aspect. An umbilical hernia is noted.   ASSESSMENT:  __Appendicitis ________.  Diabetes.  Renal insufficiency.  Possible acute tubulonephrosis.   PLAN:  To continue current regimen. To monitor her low hemoglobin and  potassium. We will give additional potassium today.      Angus G. Renard Matter, MD  Electronically Signed     AGM/MEDQ  D:  03/28/2007  T:  03/28/2007  Job:  [id number]

## 2010-09-13 NOTE — Group Therapy Note (Signed)
NAMEMarland Kitchen  Shelby, Foster                ACCOUNT NO.:  [id number]   MEDICAL RECORD NO.:  [id number]          PATIENT TYPE:  INP   LOCATION:  A209                          FACILITY:  [facility]   PHYSICIAN:  Angus G. Renard Matter, MD   DATE OF BIRTH:  [birth year]   DATE OF PROCEDURE:  DATE OF DISCHARGE:                                 PROGRESS NOTE   This patient continues to have some slight lower abdominal pain.  She  was admitted to hospital with lower abdominal pain.  Also has poorly  controlled diabetes and possible early renal failure.  She continues to  remain n.p.o. with NG tube.  She does have x-ray evidence of small bowel  perforation and/or ulcer.  Has been seen by surgery.   PERTINENT LABORATORY DATA:  WBC 70,600 with 11 lymphocytes, hematocrit  32%.  Sodium 132, potassium 3.6, BUN 43, creatinine 1.53.  Sugars range  262-365.   OBJECTIVE:  VITAL SIGNS:  Blood pressure 117/68, respiration 24,  temperature 97.4.  LUNGS:  Clear to P&A.  HEART:  Regular rhythm.  ABDOMEN:  Slightly distended.   ASSESSMENT:  The patient does have x-ray evidence of small bowel  perforation and/or ulcer with perforation.  She does have poorly  controlled diabetes, hyperglycemia urinary tract infection.   PLAN:  To continue to monitor blood sugars continue intravenous fluids.  Continue suction.  Will keep the patient also on sliding scale insulin  but will need  basal insulin as well.  Will start 20 units of Lantus  insulin.      Angus G. Renard Matter, MD  Electronically Signed     AGM/MEDQ  D:  03/19/2007  T:  03/19/2007  Job:  [id number]

## 2010-09-13 NOTE — Group Therapy Note (Signed)
NAMEMarland Kitchen  [name], [name]                ACCOUNT NO.:  [id number]   MEDICAL RECORD NO.:  [id number]          PATIENT TYPE:  INP   LOCATION:  A202                          FACILITY:  [facility]   PHYSICIAN:  Angus G. Renard Matter, MD   DATE OF BIRTH:  04/13/1946   DATE OF PROCEDURE:  DATE OF DISCHARGE:                                 PROGRESS NOTE   This patient is three days postop following her surgery by Dr. Lovell Sheehan.  The patient did have a ruptured appendix with abscess. She remains  afebrile.   OBJECTIVE:  VITAL SIGNS: Blood pressure 144/80, respiratory rate 18,  pulse 36, temp 97.6.   __________ 41,500. Chemistries: Sodium 142, potassium 5, chloride 102,  CO2 25, glucose 120, BUN 36, creatinine 1.25. __________  Abdomen is  slightly distended. Hypoactive bowel sounds. Slight tenderness around  incisions.   ASSESSMENT:  The patient had ruptured appendix with intraperitoneal  infection. She does have insulin-dependent diabetes.   PLAN:  Continue current regimen. Will continue to adjust the dosage of  basal insulin. Sugars now ranging from 213 to 144.      Angus G. Renard Matter, MD  Electronically Signed     AGM/MEDQ  D:  03/25/2007  T:  03/25/2007  Job:  [id number]

## 2010-09-13 NOTE — Group Therapy Note (Signed)
NAMEMarland Foster  AMMA, [name]                ACCOUNT NO.:  [id number]   MEDICAL RECORD NO.:  [id number]          PATIENT TYPE:  INP   LOCATION:  A209                          FACILITY:  [facility]   PHYSICIAN:  Angus G. Renard Matter, MD   DATE OF BIRTH:  20-Dec-1945   DATE OF PROCEDURE:  DATE OF DISCHARGE:                                 PROGRESS NOTE   This patient continues to have some slight lower abdominal pain.  She is  admitted to the hospital with lower abdominal pain, poorly-controlled  diabetes, possible early renal failure.  She continues to remain on NG  suction.  She she did have evidence of small bowel perforation and  possibly ulcer.  Is being seen by surgery.  Most recent lab studies BMET  sodium 134, potassium 3.4, chloride 105, CO2 23, glucose 210 BUN 38,  creatinine 0.49.  CBC WBC 19,800 with hemoglobin 11.3, hematocrit 32.6.   OBJECTIVE:  VITAL SIGNS:  Blood pressure 108/58 pulse 98, respirations  18.  Current chemistries sodium 134, potassium 3.4, chloride 105,  glucose 210, BUN 38, creatinine 1.09, sugars are still rate ranging 308  to 337.  LUNGS:  Clear.  HEART:  Regular rhythm.  ABDOMEN:  Slightly distended but no hernia.  Slight tenderness over  lower abdomen.   ASSESSMENT:  The patient does have x-ray evidence of small bowel  perforation with pneumoperitoneum.  Does have poorly controlled  diabetes, hyperglycemia, urinary tract infection.   PLAN:  To continue current IV fluids.  Continue sliding scale insulin.  Continue to advance and increase gradual Lantus insulin.  The patient is  being followed by surgical service as well, Dr. Lovell Sheehan.      Angus G. Renard Matter, MD  Electronically Signed     AGM/MEDQ  D:  03/21/2007  T:  03/21/2007  Job:  [id number]

## 2010-09-13 NOTE — Discharge Summary (Signed)
NAMEMarland Foster  [name], [name]                ACCOUNT NO.:  [id number]   MEDICAL RECORD NO.:  [id number]          PATIENT TYPE:  INP   LOCATION:  A202                          FACILITY:  [facility]   PHYSICIAN:  Angus G. Renard Matter, MD   DATE OF BIRTH:  03-19-1946   DATE OF ADMISSION:  03/16/2007  DATE OF DISCHARGE:  12/08/2008LH                               DISCHARGE SUMMARY   DIAGNOSES:  1. Ruptured appendix  2. Insulin-dependent diabetes.  3. 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  4. Anemia.  5. Iron deficiency.  6. Prerenal azotemia.  7. Generalized debility.   PROCEDURE:  Appendectomy with drainage.   CONDITION:  Stable at the time of her discharge and transfer.   Patient is a 65 year old white female.  At the time of her admission was  complaining of apparent nausea and vomiting over a period of several  days.  She was admitted to the ED, found to be diabetic with a blood  sugar of greater than 480.  Also was found to have urinary tract  infection.  Was admitted for control of her glycemic issues.  Had been  lethargic because of her diabetes.  Was admitted for volume depletion  and for control of her blood sugar and treatment of her urinary tract  infection.   EXAMINATION ON ADMISSION:  Blood pressure 131/74, pulse 106,  respirations 20.  HEENT:  PERRLA.  TM's negative.  Oropharynx benign.  NECK:  Supple.  No JVD or thyroid abnormalities.  LUNGS:  Clear to P&A.  HEART:  Regular rhythm.  No murmurs.  ABDOMEN:  Protuberant.  Patient has an umbilical hernia.  Minimal  tenderness.  No detectable organomegaly.  Patient had a trace of edema in her legs.   LAB DATA:  On admission:  Chemistries:  Sodium 130, potassium 4.1,  chloride 93, CO2 23, glucose 489, BUN 34, creatinine 1.20.  GFR 55.  CBC:  WBC 10,200 with a hemoglobin of 12.6, hematocrit 37, neutrophils  92, absolute neutrophils 9.4.  Urinalysis:  Positive glucose, 7-10  WBCs,  7-10 RBCs.  Hepatic panel:  Bilirubin 1, indirect 0.8, alkaline  phosphatase 217, SGOT 11, SGPT 17.   Subsequent CBC on November 16th:  White count 16,000 with a hemoglobin  11.3, hematocrit 33.4, 74% neutrophils.  TSH 1.460.  CBC on November 18th:  WBCs 70,600 with a hemoglobin of 11, hematocrit  32.6.  CBC on December 8th:  WBC 20,700 with hemoglobin of 9.7,  hematocrit 28.7.   Anemia panel:  Iron less than 10, retic 2.9%.   X-RAYS:  Acute abdomen and chest on March 18, 2007:  Left basilar  atelectasis, or pneumonia.   CT of chest with contrast on November 19:  Upper lobe predominant patchy  ground glass findings were likely pneumonia.   On March 22, 2007:  Chest x-ray:  Cardiomegaly.  Mild atelectasis.  Normal left effusion.   Urinary ultrasound on March 26, 2007:  Distended bladder and fullness  of left intrarenal collecting systems.  No evidence of right renal  abnormality.   CT of the pelvis with contrast on March 21, 2007:  Persistent free  intraperitoneal air.  Multiple fluid collection within the peritoneal  cavity, consistent with abscesses.  Increased left pleural effusion with  overlying air space consolidation.  Nodule, left base.  Urinary bladder  markedly distended.   A CT of the abdomen on April 01, 2007:  Improvement of pelvic fluid  collections.  No enlarging, drainable pelvic fluid noted.  Distended  urinary bladder.   HOSPITAL COURSE:  Patient at the time of admission was started on  intravenous fluids, a clear liquid diabetic diet.  She is started on IV  Cipro.  Also, was given IV Zofran 4 mg every 8 hours p.r.n.  Her glucose  was monitored q.a.c. and nightly, and she was started on NovoLog insulin  coverage.  She was also started on Levemir insulin 20 units daily.  She  was given Dilaudid 1-2 mg every 4 hours p.r.n. pain.   On November 18th, it was noted she had evidence of small bowel  perforation, thought possibly to be due to  ulcer.  She was also noted to  have poorly controlled diabetes with hyperglycemia and urinary tract  infection.  She was continued on her antibiotics.  She continued to have  some lower abdominal pain.  Was placed on NG suction, intravenous  fluids.  We did obtain a surgical consult.  Patient had CT of the  abdomen, which showed evidence of persistent free intraperitoneal air,  left pleural effusion, nodule in the left base, distended urinary  bladder.   Patient was taken to surgery on November 20 as needed for found to have  a ruptured appendix with multiple abscesses.  The appendix was removed,  and the abscess was drained.  She was followed by surgery throughout her  hospitalization.  She remained on NG suction of intravenous fluids over  an extended period of time.  She was treated with Lantus insulin and  sliding scale NovoLog insulin.   Surgery was on November 22.  Patient continued to slowly improve  throughout the remainder of her hospital stay.  She did have persistent  elevation of her white blood count.  Patient had another CT of the  abdomen, which did not show drainable abscesses.   Patient had some difficulty voiding and had to have a catheter inserted.  She was seen in consultation by urology, who recommended continued  catheter drainage.  Patient was given a trial of voiding, but this was  unsuccessful.   Patient had to be transfused towards the latter part of her  hospitalization because of low hemoglobin and hematocrit.  Does have  iron-deficiency anemia and was started on oral iron as well.   Attempts at gradual ambulation towards the latter part of her hospital  stay, but patient remained weak, having difficulty with transfers.  Arrangements for her to be placed in rehab at [facility] were made.  Patient was transferred there on the 23rd hospital day.   DISCHARGE MEDICATIONS:  Patient remains on the following medications:  1. Protonix 40 mg daily.  2. Monistat  vaginal cream 2% daily.  3. Lantus insulin 10 units daily.  4. Lovenox subcutaneous 40 mg daily.  5. Protonix 40 mg daily.  6. Ferrous sulfate 325 mg b.i.d.  7. Ambien 10 mg nightly p.r.n.  8. Accu-Cheks q.a.c. and nightly.  9. NovoLog insulin, sliding scale.   Patient is stable at the time of discharge.  Angus G. Renard Matter, MD  Electronically Signed     AGM/MEDQ  D:  04/08/2007  T:  04/08/2007  Job:  [id number]

## 2010-09-13 NOTE — Op Note (Signed)
NAMEMarland Foster  [name], [name]                ACCOUNT NO.:  [id number]   MEDICAL RECORD NO.:  [id number]          PATIENT TYPE:  AMB   LOCATION:  DAY                           FACILITY:  [facility]   PHYSICIAN:  Tilford Pillar, MD      DATE OF BIRTH:  March 10, 1946   DATE OF PROCEDURE:  06/24/2007  DATE OF DISCHARGE:                               OPERATIVE REPORT   PREOPERATIVE DIAGNOSIS:  Stitch granuloma of midline incision.   POSTPROCEDURE DIAGNOSIS:  Stitch granuloma of midline incision.   PROCEDURE:  Excision of stitch granuloma and Prolene knot with primary  closure.   SURGEON:  Tilford Pillar, MD.   ANESTHESIA:  Sedation, pharyngeal mask airway with local anesthetic.   ESTIMATED BLOOD LOSS:  Minimal.   SPECIMEN:  None.   INDICATIONS:  The patient is a 65 year old female well-known to myself  who previously underwent an exploratory laparotomy with noted gangrenous  nephrotic appendicitis.  Her recovery from this had been slow but  relatively unremarkable.  She continued to follow up in my office and  was noted to have a nonhealing portion of the wound approximately mid  way in her incision.  The remainder of the scar healed well but there  was this continuing chronic area of drainage and nonhealing.  Conservative management, including debridement in the office, was  undertaken on several attempts, however continued to have nonhealing.  At this point, due to the location it was suspected that she was having  a foreign body response to the Prolene knot that would be near this  area.  The risks, benefits and alternatives of excision were discussed  at length with the patient.  The patient's questions and concerns were  addressed.  The patient was consented for the planned procedure.   OPERATION:  The patient was taken to the operating room and was placed  in the supine position on the operating table, at time the sedation was  administered.  Once the patient was asleep she had a laryngeal  mask  airway placed and her abdomen was prepped and draped in the usual  fashion.  At this point an elliptical incision was created around the  opened portion of the wound with a 15-blade scalpel.  __________ tissue  was carried out using electrocautery.  This was taken down to the  anterior abdominal fascia which was quite superficial.  The previous  Prolene suture was identified and the knot was clearly involved in the  nonhealing portion of the wound.  This was removed with heavy Mayo  scissors.  There is no defect.  Fascia was well healed in this area.  At  this point hemostasis was obtained using electrocautery.  All  inflammatory tissue around the area and all portions of the granuloma  were removed with sharp dissection.  Electrocautery was then utilized to  obtain hemostasis.  The wound was irrigated.  A #3-0 Vicryl was utilized  to reapproximate the deep subcuticular tissue and a #4-0 Monocryl was  utilized in a running subcuticular suture to reapproximate the skin  edges.  Skin was then washed and  dried with a moist dry towel.  Benzoin  was applied around the incision, 1/2-inch Steri-Strips were placed.  The  drapes were  removed.  The patient was allowed to come out of general anesthetic.  The patient was transferred back to the regular hospital bed and  transferred to the Post Anesthetic Care Unit in stable condition.  At  the conclusion of procedure, all instrument, sponge and needle counts  were correct.  The patient tolerated the procedure well.      Tilford Pillar, MD  Electronically Signed     BZ/MEDQ  D:  06/24/2007  T:  06/24/2007  Job:  [id number]   cc:   Dr. Megan Mans

## 2010-09-13 NOTE — Group Therapy Note (Signed)
NAMEMarland Kitchen  AVRYL, ROEHM                ACCOUNT NO.:  [id number]   MEDICAL RECORD NO.:  [id number]          PATIENT TYPE:  INP   LOCATION:  A318                          FACILITY:  [facility]   PHYSICIAN:  Angus G. Renard Matter, MD   DATE OF BIRTH:  [birth year]   DATE OF PROCEDURE:  DATE OF DISCHARGE:                                 PROGRESS NOTE   This patient continues to recover from appendectomy, ruptured appendix  and pneumoperitoneum.  She does have insulin dependent diabetes and  renal insufficiency.  Does have a urinary tract infection.  She was  given voiding trial yesterday.  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  CBC did show elevated white count at  19,100 with hemoglobin 9.4, hematocrit 27.7.   Blood pressure 127/75, respirations 20, pulse 93, temperature 98.9.  HEART:  Regular rhythm.  LUNGS:  Bilaterally decreased breath sounds.  ABDOMEN:  No palpable organs or masses.  There is no tenderness around  the incision in her lower abdomen.  Umbilical hernia.  The patient does have a Foley catheter in.   ASSESSMENT:  The patient is being followed for the above-stated  problems.  She continues to have elevated white count, urinary tract  infection, and anemia.  Continues Toprol for treatment of urinary tract  infection.  Will ask urology to see the patient.  Surgery is following  the patient as well.  Continue current regimen.      Angus G. Renard Matter, MD  Electronically Signed     AGM/MEDQ  D:  04/12/2007  T:  04/12/2007  Job:  [id number]

## 2010-09-13 NOTE — Consult Note (Signed)
NAMEMarland Kitchen  [name], [name]                ACCOUNT NO.:  [id number]   MEDICAL RECORD NO.:  [id number]          PATIENT TYPE:  INP   LOCATION:  A209                          FACILITY:  [facility]   PHYSICIAN:  Dalia Heading, M.D.  DATE OF BIRTH:  08/30/1945   DATE OF CONSULTATION:  03/17/2007  DATE OF DISCHARGE:                                 CONSULTATION   REASON FOR CONSULTATION:  Pneumoperitoneum.   HISTORY OF PRESENT ILLNESS:  The patient is a 64 year old white female  who was admitted on March 16, 2007 for nonspecific abdominal pain and  poorly controlled diabetes mellitus.  She had had a 3-day history of  recurrent nausea and vomiting, and was found in the emergency room to  have a blood sugar of greater than 480.  She was admitted for control of  her blood sugars as well as treatment of a UTI.  Today, she had a CT  scan of the abdomen and pelvis which revealed speckled air  extraluminally in the peritoneum.  No specific etiology could be  identified, though she did have some mild ascending colon and cecal  inflammatory changes.  She also has evidence of diverticulosis.  No  frank abscess cavity could be identified.  Interestingly, her white  blood cell count went from 10,000 on admission to 16,000 today.  She  states that she had had some left-sided abdominal pain, but this has  since resolved.   PAST MEDICAL HISTORY:  Remarkable for diabetes mellitus.   PAST SURGICAL HISTORY:  No abdominal surgeries.   CURRENT MEDICATIONS:  No medications at the time of admission.   ALLERGIES:  No known drug allergies.   REVIEW OF SYSTEMS:  The patient denies drinking or smoking.  She denies  any other cardiopulmonary difficulties or bleeding disorders.   PHYSICAL EXAMINATION:  The patient is a pleasant 65 year old white  female in no acute distress.  ABDOMEN:  Protuberant with a fat-filled umbilical hernia.  No  hepatosplenomegaly, masses, or rigidity is noted.  She had migratory and  transient abdominal pain to palpation.  No other hernias are identified.  RECTAL EXAMINATION:  Reveals stool the rectal vault, heme-negative.   CBC:  white blood cell count 16, hematocrit 33, platelet count 364.  MET-  7 is remarkable for a glucose of 288, BUN 41, creatinine 1.5.   IMPRESSION:  1. Pneumoperitoneum of unknown etiology, question perforated      diverticulum.  2. Uncontrolled diabetes mellitus.   PLAN:  The patient's examination is, for the most part, unremarkable.  Flagyl has been added to her antibiotic regimen which included  ciprofloxacin.  At this point, I would prefer to watch her and see  whether or not this worsens.  We may be able to avoid surgery if this is  just a localized perforated diverticulum with the perforation already  sealed.  I will reevaluate her in the morning.  She should continue to  have tight control of her diabetes mellitus.  Further management is  pending followup examination.  This has been explained to the patient,  who agrees to the treatment  plan.  She would like to avoid surgery it at  all possible.      Dalia Heading, M.D.  Electronically Signed     MAJ/MEDQ  D:  03/17/2007  T:  03/18/2007  Job:  [id number]   cc:   Angus G. Renard Matter, MD  Fax: [phone]

## 2010-09-13 NOTE — Group Therapy Note (Signed)
NAMEMarland Kitchen  [name], [name]                ACCOUNT NO.:  [id number]   MEDICAL RECORD NO.:  [id number]          PATIENT TYPE:  INP   LOCATION:  A202                          FACILITY:  [facility]   PHYSICIAN:  Angus G. Renard Matter, MD   DATE OF BIRTH:  February 26, 1946   DATE OF PROCEDURE:  DATE OF DISCHARGE:                                 PROGRESS NOTE   This patient is recovering from appendectomy for ruptured appendix.  Abscess in pneumoperitoneum. She does have insulin-dependent diabetes  and renal insufficiency.  The patient's problems are improving.  She has  had episodes of low blood sugar, however, dosage has been  adjusted__________ .  She also had decreased urinary output. This is  improving.   OBJECTIVE:  VITAL SIGNS:  Blood pressure 121/68, respiration 22, pulse  95, temperature 98.5, blood sugars range from 96-201.  LUNGS:  Diminished breath sound.  HEART:  Regular rhythm.  ABDOMEN:  Abdomen tender over lower abdomen around incision.   PERTINENT LABORATORY DATA:  Hemoglobin 10.2, hematocrit 30.1. Her serum  iron was low 16. Iron-binding capacity 71.   ASSESSMENT:  The patient continues to improve following surgery for  ruptured appendix. Her blood sugars are stable and have stabilized.  Overall status seems to be improved.      Angus G. Renard Matter, MD  Electronically Signed     AGM/MEDQ  D:  04/01/2007  T:  04/01/2007  Job:  [id number]

## 2010-09-13 NOTE — Group Therapy Note (Signed)
NAMEMarland Kitchen  Shelby, Foster                ACCOUNT NO.:  [id number]   MEDICAL RECORD NO.:  [id number]          PATIENT TYPE:  INP   LOCATION:  A202                          FACILITY:  [facility]   PHYSICIAN:  Angus G. Renard Matter, MD   DATE OF BIRTH:  [birth year]   DATE OF PROCEDURE:  DATE OF DISCHARGE:                                 PROGRESS NOTE   HISTORY:  This patient is recovering from appendectomy for ruptured  appendix and abscess.  The patient also has insulin-dependent diabetes  and renal insufficiency which is improving although she has been having  episodes of low blood sugar in the middle of the night.  We adjusted her  insulin dosage.   OBJECTIVE:  VITAL SIGNS:  Blood pressure 99/51, respirations 18, pulse  82.  Temperature 97.5.  Blood sugars have ranged from 40-147.  Recent pertinent laboratory data: WBC 17,200 with hemoglobin 8.7,  hematocrit 26.1.  Chemistries:  Sodium 141, potassium 3.0, chloride 103,  glucose 46, creatinine 1.06.  LUNGS:  Diminished breath sounds.  HEART:  Regular rhythm.  ABDOMEN:  No palpable organs, masses or tenderness over lower around  incision.   ASSESSMENT:  The patient continues to improve following surgery for  ruptured appendix.  She does have continued problems with the low blood  sugars.   PLAN:  Plan to adjust insulin dosage, continue current regimen  otherwise.      Angus G. Renard Matter, MD  Electronically Signed     AGM/MEDQ  D:  03/29/2007  T:  03/29/2007  Job:  [id number]

## 2010-09-13 NOTE — Group Therapy Note (Signed)
NAMEMarland Kitchen  [name], [name]                ACCOUNT NO.:  [id number]   MEDICAL RECORD NO.:  [id number]          PATIENT TYPE:  INP   LOCATION:  A202                          FACILITY:  [facility]   PHYSICIAN:  Angus G. Renard Matter, MD   DATE OF BIRTH:  08-26-1945   DATE OF PROCEDURE:  DATE OF DISCHARGE:                                 PROGRESS NOTE   This patient is 2 days post-op following surgery by Dr. Lovell Sheehan.  The  patient did have a ruptured appendix with an abscess.  She is afebrile  today, feeling some better.  Her blood sugar raised from 146 to 342.   PERTINENT LABORATORY:  CBC:  WBC 31,300 with hemoglobin stable at 7,  hematocrit 25.7.   PHYSICAL EXAMINATION:  LUNGS:  Diminished breath sounds bilaterally.  HEART:  Regular rhythm.  ABDOMEN:  The patient has some tenderness around the incision, does have  an umbilical hernia.   ASSESSMENT:  The patient is postoperative 2 days following surgery for  removal of a ruptured appendix and drainage of purulent material.   PLAN:  Continue her current regimen.  We will continue to adjust the  dosage of insulin and get her back on basal insulin.      Angus G. Renard Matter, MD  Electronically Signed     AGM/MEDQ  D:  03/24/2007  T:  03/24/2007  Job:  [id number]

## 2010-09-13 NOTE — Group Therapy Note (Signed)
NAMEMarland Foster  BALJIT, LIEBERT                ACCOUNT NO.:  [id number]   MEDICAL RECORD NO.:  [id number]          PATIENT TYPE:  INP   LOCATION:  A209                          FACILITY:  [facility]   PHYSICIAN:  Angus G. Renard Matter, MD   DATE OF BIRTH:  1945/12/25   DATE OF PROCEDURE:  DATE OF DISCHARGE:                                 PROGRESS NOTE   This patient was admitted with nausea, lower abdominal pain.  Also found  to have uncontrolled diabetes, urinary tract infection, and decreased  glomerular filtration rate.  She was started on IV Cipro 400 mg every 5  hours.  She did have abnormalities on abdominal CT scan,  pneumoperitoneum with scattered ascites, suspect some mild gastric  proximal small bowel perforation, and probable left lower lobe  pneumonia.  NG tube was inserted.  The patient remains on IV fluids.   OBJECTIVE:  VITAL SIGNS:  Blood pressure 124/94, temperature 97, pulse  106, respirations 18.  Sugars remain 313-333.  White count 16,000,  hemoglobin 11.3, hematocrit 33.4.  The patient has 7-10 WBCS in urine.  LUNGS:  Clear.  HEART:  Regular rhythm.  ABDOMEN:  Slightly distended with absent bowel sounds.   ASSESSMENT:  The patient was admitted to the hospital with poorly  controlled diabetes, hyperglycemia, urinary tract infection, decrease in  glomerular filtration rate.  Does have x-ray evidence of small bowel  perforation and/or ulcer.   PLAN:  To continue to followup blood sugars.  Continue IV fluids, IV  antibiotics.  Will obtain surgical consult regarding pneumoperitoneum.      Angus G. Renard Matter, MD  Electronically Signed     AGM/MEDQ  D:  03/18/2007  T:  03/18/2007  Job:  [id number]

## 2010-09-13 NOTE — Consult Note (Signed)
NAMEMarland Kitchen  [name], [name]                ACCOUNT NO.:  [id number]   MEDICAL RECORD NO.:  [id number]          PATIENT TYPE:  INP   LOCATION:  A202                          FACILITY:  [facility]   PHYSICIAN:  Jorja Loa, M.D.DATE OF BIRTH:  Feb 18, 1946   DATE OF CONSULTATION:  03/26/2007  DATE OF DISCHARGE:                                 CONSULTATION   ATTENDING PHYSICIAN:  Dr. Tylene Fantasia.   REASON FOR CONSULTATION:  Renal insufficiency.   HISTORY OF PRESENT ILLNESS:  [name] is a 65 year old Caucasian  female, without a significant past medical history except a history of  diabetes, diet-controlled.  Came to the hospital with the complaints of  nausea and vomiting.  She was found to have a blood glucose of 480, and  was admitted to the hospital for control of her blood sugar and also GI.   Once she is in the hospital the patient continues to complain of  abdominal pain.  A CT scan was done and the possibility of an abdominal  problem was entertained.  She had a laparotomy which showed infected and  ruptured appendix with an abscess.  She feels fair presently, status  post day three of surgery; however, a consultation is called because of  increased BUN and creatinine become more oliguric and now worse with  hyperkalemia.  The patient denies any previous history of renal  insufficiency.  Endorses only a history of a kidney stone.  No history  of diabetic retinopathy or neuropathy.   PAST MEDICAL HISTORY:  As stated above.  Her only medical problem was  diabetes, even before that.  She does not take any medication.  She also  has a history of urinary tract infection.  Presently she was found to  have a perforated appendix with a pneumoperitoneum.  She is status post  surgery.   CURRENT MEDICATIONS:  1. Lovenox 40 mg subcu q.24h.  2. Diflucan 200 mg IV q.24h.  3. NovoLog insulin.  4. Lactated Ringer's.  5. Protonix 40 mg IV daily.  6. Presently she is also getting normal  saline at 200 mL per hour.  7. Other medications are p.r.n. medications.   SOCIAL HISTORY:  No smoking or history of alcohol abuse.   REVIEW OF SYSTEMS:  The patient has an NG tube on suction.  She denies  any shortness of breath. She denies any abdominal pain; however, she did  not move her bowels.   PHYSICAL EXAMINATION:  GENERAL:  She is alert and in no apparent  distress.  VITAL SIGNS:  Temperature 96.4 degrees, blood pressure 147/78, pulse 94.  HEENT:  No conjunctival pallor.  Anicteric.  Oral mucosa dry.  CHEST:  Clear to auscultation.  No rales, no rhonchi.  No egophony.  HEART:  A regular rate and rhythm.  No murmur, no S3.  ABDOMEN:  Hypoactive, tender, seems to be somewhat full.  EXTREMITIES:  She has about 2+ edema.   Her urine output over the last 24 hours is about 600 mL.  Her white  blood cell count is 29.4, hemoglobin 9.8, hematocrit 29.2, platelets  391.  Sodium 140, potassium 5.5, BUN 57, creatinine 2.3.  Her albumin is  1.2.  Calcium 7.3, phosphorus 7.   ASSESSMENT:  1. Renal insufficiency, at this moment seems to be acute:  Her      creatinine on March 24, 2007, was 0.86.  Presently she is non-      oliguric but she is also on Lasix.  The etiology for renal      insufficiency probably is acute tubular necrosis.  Of course      obstructive uropathy:  I need to also entertain this as a      possibility.  2. History of hyperkalemia:  Probably this may be associated with      worsening of renal failure, since she has diabetes, also needs to      be entertained.  The lactated Ringer's IV which was discontinued.      This probably might have contributed to her hyperkalemia.  3. History of ruptured appendix with abscess, status post surgery.  4. History of hyperphosphatemia:  This also could be because of      lactated Ringer's.  Her phosphorus yesterday was 5.6 and today it      is 7.  5. Severe hypoalbuminemia, probably related to malnutrition, as the       patient is still n.p.o. with a previous history of nausea and      vomiting.  6. History of diabetes:  She is on insulin.   RECOMMENDATIONS:  I will change her IV fluid to normal saline and will  continue at 130 mL per hour.  I have discussed with Dr. Lovell Sheehan.  He  says it is okay to use her NG tube.  Will give her some Kayexalate to  control her potassium.  I will start her on albumin twice daily.  I will  also start her on Lasix.  I probably will give her a high dose of 200 mg  IV twice daily.  I will do an ultrasound of the kidneys.  I will  continue with the other treatment and will discontinue lactated  Ringer's.      Jorja Loa, M.D.  Electronically Signed     BB/MEDQ  D:  03/26/2007  T:  03/26/2007  Job:  [id number]

## 2010-09-13 NOTE — H&P (Signed)
NAME:  Shelby, Foster                ACCOUNT NO.:  [id number]   MEDICAL RECORD NO.:  [id number]          PATIENT TYPE:  EMS   LOCATION:  ED                            FACILITY:  [facility]   PHYSICIAN:  Melvyn Novas, MDDATE OF BIRTH:  09/20/1945   DATE OF ADMISSION:  03/16/2007  DATE OF DISCHARGE:  LH                              HISTORY & PHYSICAL   HISTORY OF PRESENT ILLNESS:  The patient is a 65 year old white female,  a patient of Dr. Renard Matter, who sees physicians very infrequently and  currently takes no medicines.  Apparently, she was told she was diabetic  20-30 years ago and takes no medicines.  She complains of a 3-day  history of recurrent nausea and vomiting, was seen in the ER and found  to have a glucose of greater than 480 and currently not acidotic, also  found to have a UTI and will be admitted for control of the above  glycemic issues, intravenous volume depletion and treatment of IV  antibiotics for UTI as well as diabetic education.  She denies any  hematochezia or melena or hematemesis.  She denies any anginal pain,  orthopnea or PND.  She does seem somewhat lethargic and rather  uninformed of diabetic principles of care.   PAST MEDICAL HISTORY:  Significant for diabetes, otherwise unremarkable.   PAST SURGICAL HISTORY:  Remarkable for left carpal tunnel  surgery/release, otherwise unremarkable.   ALLERGIES:  She has no known allergies.   CURRENT MEDICINES:  Nothing.   SOCIAL HISTORY:  She lives with her husband, is a nonsmoker, does not  imbibe in alcohol.   PHYSICAL EXAMINATION:  VITAL SIGNS:  Blood pressure 131/74, pulse 106,  respiratory rate is 20.  She is currently afebrile.  O2 SAT is 96%.  HEENT:  Eyes:  PERRLA.  Extraocular movements intact.  Sclerae are  clear.  Tongue shows dry parched mucosa, no exudates visible.  NECK:  No JVD, no carotid bruits, no thyromegaly, no thyroid bruits.  LUNGS:  Clear to A&P.  No rales, wheezes or rhonchi  appreciable.  HEART:  Regular rhythm.  No murmurs, gallops, heaves, thrills or rubs.  ABDOMEN:  Protuberant.  Bowel sounds are normoactive.  No guarding or  rebound.  No masses.  No detectable organomegaly.  EXTREMITIES:  Trace to 1+ pedal edema.  NEUROLOGIC:  Cranial nerves II-XII are grossly intact.  The patient  moves all 4 extremities.  She is somewhat slow in mentation, but  oriented in 3 spheres.   IMPRESSION:  1. Gastroenteritis and recurrent nausea with vomiting.  2. Uncontrolled diabetes, on no medicines.  3. Urinary tract infection.  4. Diminished glomerular filtration rate of 46, calculated, with      apparently normal serum creatinine.   PLAN:  The plan is to admit, give IV insulin infusions, clear liquid  diet, obtain abdominal sonogram in a.m., consider Glucophage orally and  diabetes education at earliest convenience and IV Cipro 400 mg q.12 h.  I will make further recommendations as the data base expands.      Melvyn Novas, MD  Electronically  Signed     RMD/MEDQ  D:  03/16/2007  T:  03/17/2007  Job:  [id number]

## 2010-09-13 NOTE — Group Therapy Note (Signed)
NAMEMarland Kitchen  [name], [name]                ACCOUNT NO.:  [id number]   MEDICAL RECORD NO.:  [id number]          PATIENT TYPE:  INP   LOCATION:  A209                          FACILITY:  [facility]   PHYSICIAN:  Angus G. Renard Matter, MD   DATE OF BIRTH:  12/26/1945   DATE OF PROCEDURE:  DATE OF DISCHARGE:                                 PROGRESS NOTE   This patient is feeling better.  She does not have NG tube in.  She was  admitted to hospital with lower abdominal pain, poorly controlled  diabetes, possible early renal failure.  She did have evidence of small  bowel perforation and possible ulcer.  Has been seen by surgery as well.   OBJECTIVE:  VITAL SIGNS:  Blood pressure 104/60, respiration 22, pulse  101.  Temperature 98.3, blood sugars range from 77-313.  Most recent  CBC,  WBC 29,000, with hemoglobin of 0.1, hematocrit 32.1.  Chemistries  sodium 134, potassium 4, chloride 105, CO2 21, glucose 166, BUN 42,  creatinine 1418, GFR 47.  LUNGS:  Clear breath sounds.  HEART:  Regular rhythm.  ABDOMEN:  Slightly distended she a local hernia.   ASSESSMENT:  The patient does have evidence of small bowel perforation,  pneumoperitoneum. She has had poorly controlled diabetes improving  urinary tract infection.  Still has leukocytosis.  A repeat CT of  abdomen showed persistent intraperitoneal air consistent with this.  This perforation's interval development of multiple fluid collections in  the peritoneal cavity, consistent with abscesses, large fluid collection  the cul-de-sac.   PLAN:  To continue current regimen.  Will discuss with Dr. Lovell Sheehan  concerning further management and possible surgery.      Angus G. Renard Matter, MD  Electronically Signed     AGM/MEDQ  D:  03/22/2007  T:  03/22/2007  Job:  [id number]

## 2010-09-13 NOTE — Group Therapy Note (Signed)
NAMEMarland Foster  ELLIOT, [name]                ACCOUNT NO.:  [id number]   MEDICAL RECORD NO.:  [id number]          PATIENT TYPE:  INP   LOCATION:  A202                          FACILITY:  [facility]   PHYSICIAN:  Angus G. Renard Matter, MD   DATE OF BIRTH:  03-18-1946   DATE OF PROCEDURE:  DATE OF DISCHARGE:                                 PROGRESS NOTE   SUBJECTIVE:  This patient continues to recover from appendectomy for  ruptured appendix, abscess and pneumoperitoneum.  The patient does have  insulin-dependent diabetes and renal insufficiency.  She remains on  catheter drainage.   LABORATORY DATA:  BMET today:  Sodium 137, potassium 3.6, chloride 108,  CO2 26, glucose 196, BUN 7.  CBC:  WBC 18,700 with hemoglobin 8.6,  hematocrit 25.3.   OBJECTIVE:  VITAL SIGNS:  Blood pressure 130/72, respiration 18, pulse  108, temperature 97.7.  HEART:  Regular rhythm.  LUNGS:  Clear to P&A.  ABDOMEN:  The patient has umbilical hernia and some tenderness over the  incision in the lower abdomen.   ASSESSMENT:  The patient is recovering from surgery for ruptured  appendix.  She does have diabetes and has had difficulty voiding.  She  now has anemia.   PLAN:  Obtain anemia profile, type and cross match with 2 units of  packed RBCs.  We will transfuse today.      Angus G. Renard Matter, MD  Electronically Signed     AGM/MEDQ  D:  04/05/2007  T:  04/05/2007  Job:  [id number]

## 2010-09-13 NOTE — Group Therapy Note (Signed)
NAMEMarland Kitchen  [name]                ACCOUNT NO.:  [id number]   MEDICAL RECORD NO.:  [id number]          PATIENT TYPE:  INP   LOCATION:  A202                          FACILITY:  [facility]   PHYSICIAN:  Shelby G. Renard Matter, MD   DATE OF BIRTH:  1946-04-12   DATE OF PROCEDURE:  DATE OF DISCHARGE:                                 PROGRESS NOTE   This patient continues to recover from appendectomy for ruptured  appendix, abscess and pneumoperitoneum. The patient does have insulin-  dependent diabetes, renal insufficiency. She is on catheter drainage  now.  A B-met yesterday was all within normal limits.  CBC done  yesterday showed 18,000.8 white count with hemoglobin 10.1, hematocrit  30.0. Her urinary output continues to improve which had negative fluid  balance of 640.   ASSESSMENT:  The patient recovered from ruptured appendix, diabetes  mellitus fairly well-controlled. Has had some problems with voiding.  Elevated white count sugars now running in relatively normal range from  86-138.   PLAN:  To continue current regimen.      Shelby G. Renard Matter, MD  Electronically Signed     AGM/MEDQ  D:  04/04/2007  T:  04/04/2007  Job:  [id number]

## 2010-09-13 NOTE — Group Therapy Note (Signed)
NAMEMarland Foster  TELIA, AMUNDSON                ACCOUNT NO.:  [id number]   MEDICAL RECORD NO.:  [id number]          PATIENT TYPE:  INP   LOCATION:  A202                          FACILITY:  [facility]   PHYSICIAN:  Angus G. Renard Matter, MD   DATE OF BIRTH:  May 16, 1945   DATE OF PROCEDURE:  DATE OF DISCHARGE:                                 PROGRESS NOTE   This patient continues to recover from appendectomy for ruptured  appendix,  abscess in pneumoperitoneum.  She does have ongoing problem  of insulin-dependent diabetes, renal insufficiency.  She remains on  catheter drainage.  She does have some infection around the wound lower  abdomen and elevated white count.  A CBC done yesterday shows a white  count of 20,700 with hemoglobin 9.7, hematocrit 28.7.  BNP was  essentially normal.   OBJECTIVE:  VITAL SIGNS:  Blood pressure 118/71, respiration 16, pulse  99, temperature 99.8.  Blood sugars range from 113-147.  HEART:  Regular rhythm.  LUNGS:  Diminished breath sounds bilaterally.  ABDOMEN:  No palpable organs or mass.  The patient does have umbilical  hernia, tenderness around incision.   ASSESSMENT:  The patient has been followed for above-stated problems.  Attempts to get her to a rehab facility in [facility] yesterday were  unsuccessful because of elevated white count.  The patient was placed on  Keflex by Dr. Leticia Penna.  Continue current regimen.      Angus G. Renard Matter, MD  Electronically Signed     AGM/MEDQ  D:  04/09/2007  T:  04/09/2007  Job:  [id number]

## 2010-09-13 NOTE — Group Therapy Note (Signed)
NAMEMarland Kitchen  Foster, Shelby                ACCOUNT NO.:  [id number]   MEDICAL RECORD NO.:  [id number]          PATIENT TYPE:  INP   LOCATION:  A202                          FACILITY:  [facility]   PHYSICIAN:  Angus G. Renard Matter, MD   DATE OF BIRTH:  Dec 15, 1945   DATE OF PROCEDURE:  DATE OF DISCHARGE:                                 PROGRESS NOTE   This patient is recovering from appendectomy for ruptured appendix and  abscess mass and pneumoperitoneum. Does have insulin-dependent diabetes  and renal insufficiency.  She has had problems voiding. The patient has  been catheterized every 6 hours. Also has continued problem with  elevated white count. She did have a repeat CT of pelvis with contrast  results improvement in peritoneal fluid collections, stable left pleural  effusion, persistent left lower lobe nodule, distended urinary bladder.   OBJECTIVE:  VITAL SIGNS:  Blood pressure 133/74, respiration 20, pulse  92, temp 97.1.  Blood sugars ranged from 131-201. Recent white blood  count 18,300 with hemoglobin 9.7, hematocrit 28.3.  GENERAL:  The patient has generalized weakness.  LUNGS:  Decreased breath sounds bilaterally.  HEART:  Regular rhythm.  ABDOMEN:  Tenderness around incision, umbilical no hernia.   ASSESSMENT:  The patient is recovering from a ruptured appendix. She  does have diabetes, is fairly well-controlled and has problem with  voiding.   PLAN:  To continue her current regimen. Will discuss with surgery and  urology.      Angus G. Renard Matter, MD  Electronically Signed     AGM/MEDQ  D:  04/02/2007  T:  04/02/2007  Job:  [id number]

## 2010-09-13 NOTE — Group Therapy Note (Signed)
NAMEMarland Foster  [name], [name]                ACCOUNT NO.:  [id number]   MEDICAL RECORD NO.:  [id number]          PATIENT TYPE:  INP   LOCATION:  A202                          FACILITY:  [facility]   PHYSICIAN:  Angus G. Renard Matter, MD   DATE OF BIRTH:  August 24, 1945   DATE OF PROCEDURE:  DATE OF DISCHARGE:                                 PROGRESS NOTE   This patient continues recover from appendectomy for ruptured appendix  and pneumoperitoneum.  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  She remains on catheter drainage.  Does have  elevated white count.  Most recent lab studies shows a CBC WBC 19,100  with hemoglobin 9.4, hematocrit 27.7.  Chemistries remain normal.   OBJECTIVE:  VITAL SIGNS:  Blood pressure 128/64, respirations 18, pulse  94, temperature 97.9.  HEART:  Regular rhythm.  LUNGS:  Diminished breath sounds bilaterally.  ABDOMEN:  No palpable organs or masses.  The patient does have abdominal  incision, umbilical hernia.   ASSESSMENT:  The patient is being followed for above-stated problems.  She does have a urinary tract infection which is due to Citrobacter  Freundii sensitive to ciprofloxacin.   PLAN:  To continue current regimen.  Will had Cipro to regimen.  Will  give the patient voiding trial with discontinuation of catheter and  reinsertion if needed.  The patient does have a wound infection as well.      Angus G. Renard Matter, MD  Electronically Signed     AGM/MEDQ  D:  04/11/2007  T:  04/11/2007  Job:  [id number]

## 2010-09-16 NOTE — Op Note (Signed)
[facility]  Patient:    [name], [name] [facility] Visit Number: [id number] MRN: [id number]          Service Type: DSU Location: [facility] Attending Physician:  Kendell Bane Dictated by:   Lowell Bouton, M.D. Adm. Date:  12/26/2000   CC:         Dr. 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  PROCEDURE:  Left carpal tunnel release with anterior transposition subcutaneously of the left ulnar nerve at the elbow.  SURGEON:  Lowell Bouton, M.D.  ANESTHESIA:  General.  OPERATIVE FINDINGS:  The patient had significant compression on both nerves. The median nerve at the wrist showed a very narrow carpal canal with an intact motor branch.  The ulnar nerve at the elbow showed compression at the entrance to the flexor carpi ulnaris.  DESCRIPTION OF PROCEDURE:  Under general anesthesia with a tourniquet on the left arm, the left arm was prepped and draped in the usual fashion and after exsanguinating the limb, the tourniquet was inflated to 250 mmHg.  The carpal tunnel release was performed first, and a 3 cm longitudinal incision was made at the palm just ulnar to the thenar crease.  Sharp dissection was carried through the subcutaneous tissues, and bleeding points were coagulated.  Blunt dissection was carried through the superficial palmar fascia distal to the transverse carpal ligament.  A hemostat was then placed in the carpal canal up against the hook of the hamate, and the transverse carpal ligament was divided on the ulnar border of the median nerve.  The proximal end of the ligament was divided with a scissors after dissecting the nerve away from the undersurface of the ligament.  The carpal canal was then palpated and was found to be adequately  decompressed.  The median nerve was examined and the motor branch identified.  No masses were identified in the carpal tunnel.  The wound was irrigated with saline.  The skin was closed with 4-0 nylon suture.  Attention was then focused on the elbow, and a longitudinal incision was made over the medial side of the elbow extending about 3 cm proximal to the medial epicondyle and 3 cm distal.  Sharp dissection was carried through the subcutaneous tissues, and bleeding points were coagulated.  Blunt dissection was carried proximally to distally, identifying the ulnar nerve in the groove. The nerve was traced out from proximal to distal, taking care to protect the branches of the flexor carpi ulnaris.  After the nerve had been totally dissected out and the point of compression identified as the FCU, the nerve was freed up circumferentially and transposed anterior to the medial epicondyle.  A bed was made for the nerve by bluntly dissecting anterior to the condyle in the subcutaneous fat.  The nerve was then brought anteriorly, and the subcutaneous fat was closed over the medial epicondyle, allowing the nerve to slide in its tunnel.  The elbow was brought through its full range of motion, and the nerve did not subluxate.  The wound was then irrigated copiously.  A vessel loop drain was left in for drainage.  Subcutaneous tissue was closed with 4-0 Vicryl, the skin was closed with a 3-0 subcuticular Prolene, and Steri-Strips were applied, followed by sterile dressings.  The patient was placed in a posterior elbow splint.  The dressings were  applied to the hand also.  She had the tourniquet released with good circulation and went to the recovery room awake and stable, in good condition. Dictated by:   Lowell Bouton, M.D. Attending Physician:  Kendell Bane DD:  12/26/00 TD:  12/27/00 Job: [id number]

## 2011-01-16 ENCOUNTER — Other Ambulatory Visit: Payer: Self-pay

## 2011-01-16 ENCOUNTER — Emergency Department (HOSPITAL_COMMUNITY): Payer: Medicare Other

## 2011-01-16 ENCOUNTER — Inpatient Hospital Stay (HOSPITAL_COMMUNITY)
Admission: EM | Admit: 2011-01-16 | Discharge: 2011-01-24 | DRG: 291 | Disposition: A | Discharge status: Home / Self Care | Payer: Medicare Other | Attending: Family Medicine | Admitting: Family Medicine

## 2011-01-16 DIAGNOSIS — I161 Hypertensive emergency: Secondary | ICD-10-CM

## 2011-01-16 DIAGNOSIS — E0865 Diabetes mellitus due to underlying condition with hyperglycemia: Secondary | ICD-10-CM | POA: Diagnosis present

## 2011-01-16 DIAGNOSIS — E119 Type 2 diabetes mellitus without complications: Secondary | ICD-10-CM | POA: Diagnosis present

## 2011-01-16 DIAGNOSIS — D649 Anemia, unspecified: Secondary | ICD-10-CM

## 2011-01-16 DIAGNOSIS — D509 Iron deficiency anemia, unspecified: Secondary | ICD-10-CM | POA: Diagnosis present

## 2011-01-16 DIAGNOSIS — I8500 Esophageal varices without bleeding: Secondary | ICD-10-CM | POA: Diagnosis present

## 2011-01-16 DIAGNOSIS — Z992 Dependence on renal dialysis: Secondary | ICD-10-CM

## 2011-01-16 DIAGNOSIS — N039 Chronic nephritic syndrome with unspecified morphologic changes: Secondary | ICD-10-CM | POA: Diagnosis present

## 2011-01-16 DIAGNOSIS — I5041 Acute combined systolic (congestive) and diastolic (congestive) heart failure: Principal | ICD-10-CM | POA: Diagnosis present

## 2011-01-16 DIAGNOSIS — N179 Acute kidney failure, unspecified: Secondary | ICD-10-CM | POA: Diagnosis present

## 2011-01-16 DIAGNOSIS — N189 Chronic kidney disease, unspecified: Secondary | ICD-10-CM | POA: Diagnosis present

## 2011-01-16 DIAGNOSIS — I120 Hypertensive chronic kidney disease with stage 5 chronic kidney disease or end stage renal disease: Secondary | ICD-10-CM | POA: Diagnosis present

## 2011-01-16 DIAGNOSIS — K2980 Duodenitis without bleeding: Secondary | ICD-10-CM | POA: Diagnosis present

## 2011-01-16 DIAGNOSIS — D631 Anemia in chronic kidney disease: Secondary | ICD-10-CM | POA: Diagnosis present

## 2011-01-16 DIAGNOSIS — E872 Acidosis, unspecified: Secondary | ICD-10-CM | POA: Diagnosis present

## 2011-01-16 DIAGNOSIS — I509 Heart failure, unspecified: Secondary | ICD-10-CM | POA: Diagnosis present

## 2011-01-16 DIAGNOSIS — N186 End stage renal disease: Secondary | ICD-10-CM | POA: Diagnosis present

## 2011-01-16 DIAGNOSIS — K922 Gastrointestinal hemorrhage, unspecified: Secondary | ICD-10-CM

## 2011-01-16 HISTORY — DX: Patient's noncompliance with other medical treatment and regimen: Z91.19

## 2011-01-16 HISTORY — DX: Iron deficiency anemia, unspecified: D50.9

## 2011-01-16 HISTORY — DX: Urinary tract infection, site not specified: N39.0

## 2011-01-16 HISTORY — DX: Disorder of kidney and ureter, unspecified: N28.9

## 2011-01-16 HISTORY — DX: Patient's noncompliance with other medical treatment and regimen due to unspecified reason: Z91.199

## 2011-01-16 HISTORY — DX: Type 2 diabetes mellitus without complications: E11.9

## 2011-01-16 HISTORY — DX: Pure hypercholesterolemia, unspecified: E78.00

## 2011-01-16 HISTORY — DX: Acute appendicitis with perforation, localized peritonitis, and gangrene, without abscess: K35.32

## 2011-01-16 HISTORY — DX: Essential (primary) hypertension: I10

## 2011-01-16 LAB — CBC
MCH: 29.7 pg (ref 26.0–34.0)
MCHC: 34.1 g/dL (ref 30.0–36.0)
Platelets: 299 10*3/uL (ref 150–400)
RDW: 16.1 % — ABNORMAL HIGH (ref 11.5–15.5)

## 2011-01-16 LAB — BASIC METABOLIC PANEL
BUN: 97 mg/dL — ABNORMAL HIGH (ref 6–23)
GFR calc Af Amer: 6 mL/min — ABNORMAL LOW (ref >60)
GFR calc non Af Amer: 5 mL/min — ABNORMAL LOW (ref >60)
Potassium: 4.9 mEq/L (ref 3.5–5.1)
Sodium: 142 mEq/L (ref 135–145)

## 2011-01-16 LAB — DIFFERENTIAL
Basophils Absolute: 0 10*3/uL (ref 0.0–0.1)
Basophils Relative: 0 % (ref 0–1)
Eosinophils Absolute: 0.1 10*3/uL (ref 0.0–0.7)
Monocytes Relative: 9 % (ref 3–12)
Neutro Abs: 6.4 10*3/uL (ref 1.7–7.7)
Neutrophils Relative %: 83 % — ABNORMAL HIGH (ref 43–77)

## 2011-01-16 LAB — PRO B NATRIURETIC PEPTIDE: Pro B Natriuretic peptide (BNP): >70000 pg/mL — ABNORMAL HIGH (ref 0–125)

## 2011-01-16 LAB — OCCULT BLOOD, POC DEVICE: Fecal Occult Bld: POSITIVE

## 2011-01-16 LAB — POCT I-STAT TROPONIN I: Troponin i, poc: 0.01 ng/mL (ref 0.00–0.08)

## 2011-01-16 LAB — GLUCOSE, CAPILLARY
Glucose-Capillary: 73 mg/dL (ref 70–99)
Glucose-Capillary: 131 mg/dL — ABNORMAL HIGH (ref 70–99)

## 2011-01-16 MED ORDER — FUROSEMIDE 10 MG/ML IJ SOLN
40.0000 mg | Freq: Two times a day (BID) | INTRAMUSCULAR | Status: DC
  Administered 2011-01-16: 40 mg via INTRAVENOUS
  Filled 2011-01-16: qty 4

## 2011-01-16 MED ORDER — PANTOPRAZOLE SODIUM 40 MG IV SOLR
40.0000 mg | Freq: Once | INTRAVENOUS | Status: AC
  Administered 2011-01-16: 40 mg via INTRAVENOUS
  Filled 2011-01-16: qty 40

## 2011-01-16 MED ORDER — NITROGLYCERIN IN D5W 200-5 MCG/ML-% IV SOLN
5.0000 ug/min | INTRAVENOUS | Status: DC
  Administered 2011-01-16: 5 ug/min via INTRAVENOUS
  Filled 2011-01-16: qty 250

## 2011-01-16 MED ORDER — FUROSEMIDE 10 MG/ML IJ SOLN
40.0000 mg | Freq: Once | INTRAMUSCULAR | Status: AC
  Administered 2011-01-16: 40 mg via INTRAVENOUS
  Filled 2011-01-16: qty 4

## 2011-01-16 MED ORDER — INSULIN ASPART 100 UNIT/ML ~~LOC~~ SOLN
0.0000 [IU] | Freq: Every day | SUBCUTANEOUS | Status: DC
  Administered 2011-01-19: 0 [IU] via SUBCUTANEOUS

## 2011-01-16 MED ORDER — FUROSEMIDE 40 MG PO TABS: 40.0000 mg | ORAL_TABLET | Freq: Once | ORAL | Status: DC

## 2011-01-16 MED ORDER — NITROGLYCERIN IN D5W 200-5 MCG/ML-% IV SOLN
2.0000 ug/min | INTRAVENOUS | Status: DC
  Administered 2011-01-16: 5 ug/min via INTRAVENOUS

## 2011-01-16 MED ORDER — PANTOPRAZOLE SODIUM 40 MG IV SOLR
40.0000 mg | INTRAVENOUS | Status: DC
  Administered 2011-01-17 – 2011-01-20 (×4): 40 mg via INTRAVENOUS
  Filled 2011-01-16 (×4): qty 40

## 2011-01-16 MED ORDER — INSULIN ASPART 100 UNIT/ML ~~LOC~~ SOLN
0.0000 [IU] | Freq: Three times a day (TID) | SUBCUTANEOUS | Status: DC
  Administered 2011-01-18: 2 [IU] via SUBCUTANEOUS
  Administered 2011-01-20: 3 [IU] via SUBCUTANEOUS
  Administered 2011-01-22 (×3): 2 [IU] via SUBCUTANEOUS
  Administered 2011-01-23: 5 [IU] via SUBCUTANEOUS
  Administered 2011-01-24: 2 [IU] via SUBCUTANEOUS
  Filled 2011-01-16 (×2): qty 3

## 2011-01-16 NOTE — ED Notes (Signed)
Pt sleeping, resp eased, family updated,

## 2011-01-16 NOTE — ED Notes (Signed)
CRITICAL VALUE ALERT  Critical value received:  co2 9, calcium 5.4  Date of notification:  01/16/2011  Time of notification:  13:25  Critical value read back: yes  Nurse who received alert:  Juliette Alcide, RN   MD notified (1st page):  Dr. Weldon Inches  Time of first page:  13:27  MD notified (2nd page):  Time of second page:  Responding MD:  Dr. Weldon Inches  Time MD responded:  13:27

## 2011-01-16 NOTE — ED Notes (Signed)
EMS and pt report pt has had SOB x 1 month but has progressively gotten worse.  Has swelling to lower extremities.  Reports has not had any of her medications in the past 2 months.

## 2011-01-16 NOTE — ED Notes (Signed)
Pt states that her breathing is better, still denies any pain, pt updated on plan of care

## 2011-01-16 NOTE — ED Notes (Signed)
Telephone admission orders received from Dr. Renard Matter

## 2011-01-16 NOTE — ED Notes (Addendum)
Pt brought to er by ems with c/o sob that has been going on for over a month, increasing in difficulty over the past few days, pt has swelling that extends up to the thighs of both legs, left leg more swollen that right, pt has swelling to abd area, labored breathing noted, denies any pain, pt also states that she has not had her medications in over two months due to not being able to afford them, Dr. Letta Pate notified of pt being in department,

## 2011-01-16 NOTE — ED Notes (Signed)
Rectal exam done by Dr. Weldon Inches, pt heme positive,

## 2011-01-16 NOTE — ED Notes (Signed)
Report given to Marie, RN.

## 2011-01-16 NOTE — H&P (Signed)
NAMEMarland Kitchen  KEIGAN, GIRTEN                ACCOUNT NO.:  [id number]  MEDICAL RECORD NO.:  [id number]  LOCATION:  [facility]                         FACILITY:  [facility]  PHYSICIAN:  Jaden Batchelder G. Renard Matter, MD   DATE OF BIRTH:  November 23, 1945  DATE OF ADMISSION:  01/16/2011 DATE OF DISCHARGE:  LH                             HISTORY & PHYSICAL   This 65 year old white female presented to the emergency room with the chief complaint being shortness of breath which had been progressively becoming worse over the past month.  She was brought by EMS to emergency department with these complaints.  Apparently, she had noted some edema in her extremities as well.  The patient apparently had not been taking any of her for the past 2 months including insulin because she was not able to afford them.  Her primary care doctor, Dr. Renard Matter had not seen her for approximately 1 year.  She does have prior history of insulin- dependent diabetes, previous history of ruptured appendix with subsequent surgery, previous history of azotemia.  The patient was seen and evaluated by ED physician.  Lab data done in the ED showed a white blood count 7.7 with hemoglobin 7.8, hematocrit 22.9.  The patient was heme positive on examination.  She did have abnormal chemistries as well with creatinine 8.22, BUN 97, sodium 142, potassium 4.9, chloride 109, CO2 of 9.  BNP was greater than 70,000, troponin 0.01.  Fecal occult blood positive.  X-ray report which showed mild heart failure, mild edema, and small pleural effusions, stable pulmonary nodule, intravenous fluids were started and patient was subsequently admitted.  SOCIAL HISTORY:  The patient does not smoke or use alcohol or drugs.  FAMILY HISTORY:  Not available.  PAST MEDICAL HISTORY: 1. Insulin-dependent diabetes. 2. Hyperlipidemia. 3. Azotemia.  PAST SURGICAL HISTORY:  The patient did have ruptured appendix in 2008, with surgery by Dr. Lovell Sheehan and Dr. Leticia Penna, exploratory  laparotomy, gangrenous appendix with abscesses in abdomen, appendectomy, subsequent wound infection and surgery for this, mesenteric ischemia ventral hernia, chronic renal insufficiency, possible acute tubular necrosis.  REVIEW OF SYSTEMS:  HEENT:  Negative.  CARDIOPULMONARY:  The patient has had dyspnea and weakness over the past few days.  No hemoptysis.  GI: No bowel irregularity or bleeding noted.  GU:  No dysuria or hematuria.  PHYSICAL EXAMINATION:  VITAL SIGNS:  Alert patient with blood pressure 193/105, pulse 84, temp 97.5, respirations 26. HEENT:  Eyes PERRLA.  TM negative.  Oropharynx benign. NECK:  Supple.  No carotid bruit.  Distended JVD on right. HEART:  Regular rhythm.  No murmurs.  No cardiomegaly noted. LUNGS:  Expiratory wheeze on right side. ABDOMEN:  The patient has umbilical hernia and distention of lower abdomen.  No palpable organs or masses or tenderness. EXTREMITIES:  The patient does have bilateral brawny edema and discoloration of lower extremities. NEUROLOGICAL:  The patient is alert and oriented.  No weakness in extremities or abnormal reflexes.  Has not noted venous stasis changes in both lower extremities.  ASSESSMENT AND PROBLEMS: 1. Hypertension. 2. Congestive heart failure with markedly elevated BNP and mild     pleural effusion. 3. Gastrointestinal bleed with anemia. 4. Azotemia. 5.  Poorly controlled insulin-dependent diabetes. 6. Ventral hernia.  PLAN:  To admit to intensive care.  Continue nitroglycerin drip to help control blood pressure.  We will start at the moment clear liquids by mouth.  We will monitor her blood sugars and give appropriate doses of NovoLog insulin by sliding scale.  We will continue IV Lasix with hydration, monitor urinary output.  GI consult with Dr. Karilyn Cota for further evaluation of GI bleed.  We will give packed RBCs when available and continue to monitor hemoglobin and hematocrit.  We will obtain a Cardiology  consult.  We will order 2-D echo, continue to monitor.  The patient might need as well a consultation with Nephrology and if creatinine remains high, will need dialysis.     Mcarthur Ivins G. Renard Matter, MD     AGM/MEDQ  D:  01/16/2011  T:  01/16/2011  Job:  [id number]

## 2011-01-16 NOTE — ED Provider Notes (Addendum)
Scribed for Shelby Stairs, MD, the patient was seen in room [facility] . This chart was scribed by Ellie Lunch. This patient's care was started at 12:13 PM.   CSN: [id number] Arrival date & time: 01/16/2011 11:58 AM   Chief Complaint  Patient presents with  . Shortness of Breath   (Include location/radiation/quality/duration/timing/severity/associated sxs/prior treatment) Patient is a 65 y.o. female presenting with shortness of breath.  Shortness of Breath  Associated symptoms include shortness of breath.   [name] is a 65 y.o. female brought in by ambulance Emergency Department complaining of SOB. Pt reports SOB for the past month that has become progressively worse in the last week. SOB is aggravated by exertion and supine position. Pt also reports pedal edema for the past week. Denies chest pain, frequency or urgency. Does not take diuretics. Pt does not use suppelmental O2 at home. No Hx of emphysema, CHF, hypertension, MI, liver or kidney problems.  Pt has been sob becoming progressively worse in the last week.  Additionally Pt reports she has not taken any of her medications in the past 2 months because she is unable to afford them.   PCP. Dr Megan Mans.   Past Medical History  Diagnosis Date  . Diabetes mellitus   . Hypercholesteremia    Past Surgical History  Procedure Date  . Appendectomy    No family history on file.  History  Substance Use Topics  . Smoking status: Never Smoker   . Smokeless tobacco: Not on file  . Alcohol Use: No    Review of Systems  Respiratory: Positive for shortness of breath.   10 Systems reviewed and are negative for acute change except as noted in the HPI.  Allergies  Review of patient's allergies indicates no known allergies.  Home Medications  No current outpatient prescriptions on file.  Physical Exam    BP 193/105  Pulse 84  Temp(Src) 97.5 F (36.4 C) (Oral)  Resp 26  SpO2 100%  Physical Exam  Nursing note  and vitals reviewed. Constitutional: She is oriented to person, place, and time. She appears well-developed and well-nourished.  HENT:  Head: Normocephalic and atraumatic.  Eyes: Conjunctivae and EOM are normal.  Neck: Normal range of motion. Neck supple. JVD (right side) present. Carotid bruit is not present (No R/L bruit).  Cardiovascular: Normal rate, regular rhythm and normal heart sounds.   No murmur heard. Pulmonary/Chest: She has wheezes (expiratory wheezing right side).  Abdominal: She exhibits distension. There is no tenderness.       Umbilical hernia.   Musculoskeletal: Normal range of motion. She exhibits edema. She exhibits no tenderness.  Neurological: She is alert and oriented to person, place, and time.  Skin: Skin is warm and dry.       venastasis skin changes to lower extremities.  Erythema on right lower extremity.   Psychiatric: She has a normal mood and affect.   Procedures  OTHER DATA REVIEWED: Nursing notes, vital signs, and past medical records reviewed.  DIAGNOSTIC STUDIES: Oxygen Saturation is 100% on 2 liters/min via Patient connected to nasal cannula oxygen, normal by my interpretation.     Date: 01/16/2011  Rate: 81  Rhythm: normal sinus rhythm  QRS Axis: normal  Intervals: QT prolonged  ST/T Wave abnormalities: nonspecific T wave changes  Conduction Disutrbances:none  Narrative Interpretation: Poor R wave progression. Left atrial abnormality.   Old EKG Reviewed: changes noted  LABS / RADIOLOGY:  Results for orders placed during the hospital encounter of 01/16/11  CBC      Component Value Range   WBC 7.7  4.0 - 10.5 (K/uL)   RBC 2.63 (*) 3.87 - 5.11 (MIL/uL)   Hemoglobin 7.8 (*) 12.0 - 15.0 (g/dL)   HCT 16.1 (*) 09.6 - 46.0 (%)   MCV 87.1  78.0 - 100.0 (fL)   MCH 29.7  26.0 - 34.0 (pg)   MCHC 34.1  30.0 - 36.0 (g/dL)   RDW 04.5 (*) 40.9 - 15.5 (%)   Platelets 299  150 - 400 (K/uL)  DIFFERENTIAL      Component Value Range   Neutrophils  Relative 83 (*) 43 - 77 (%)   Neutro Abs 6.4  1.7 - 7.7 (K/uL)   Lymphocytes Relative 7 (*) 12 - 46 (%)   Lymphs Abs 0.6 (*) 0.7 - 4.0 (K/uL)   Monocytes Relative 9  3 - 12 (%)   Monocytes Absolute 0.7  0.1 - 1.0 (K/uL)   Eosinophils Relative 1  0 - 5 (%)   Eosinophils Absolute 0.1  0.0 - 0.7 (K/uL)   Basophils Relative 0  0 - 1 (%)   Basophils Absolute 0.0  0.0 - 0.1 (K/uL)  BASIC METABOLIC PANEL      Component Value Range   Sodium 142  135 - 145 (mEq/L)   Potassium 4.9  3.5 - 5.1 (mEq/L)   Chloride 109  96 - 112 (mEq/L)   CO2 9 (*) 19 - 32 (mEq/L)   Glucose, Bld 95  70 - 99 (mg/dL)   BUN 97 (*) 6 - 23 (mg/dL)   Creatinine, Ser 8.11 (*) 0.50 - 1.10 (mg/dL)   Calcium 5.4 (*) 8.4 - 10.5 (mg/dL)   GFR calc non Af Amer 5 (*) >60 (mL/min)   GFR calc Af Amer 6 (*) >60 (mL/min)  PRO B NATRIURETIC PEPTIDE      Component Value Range   BNP, POC >70000.0 (*) 0 - 125 (pg/mL)   Dg Chest Portable 1 View  01/16/2011  *RADIOLOGY REPORT*  Clinical Data: Shortness of breath.  Wheezing.  PORTABLE CHEST - 1 VIEW  Comparison: Radiographs dated 03/22/2007 and 07/24/2003 and a CT scan of the abdomen dated 04/12/2007  Findings: Mild cardiomegaly with slight pulmonary vascular prominence and slight haziness at the lung bases suggesting mild edema.  Probable small bilateral pleural effusions.  Stable 12 mm nodule at the left lung base, described on the prior CT dated 04/12/2007.  IMPRESSION: Probable mild heart failure.  Mild edema with small effusions. Stable pulmonary nodule.  Original Report Authenticated By: Gwynn Burly, M.D.   ED COURSE / COORDINATION OF CARE: 12:19 EDP discussed PE with family indicating possible CHF. EDP ordered the following Orders Placed This Encounter  Procedures  . DG Chest Portable 1 View  . CBC  . Differential  . Basic metabolic panel  . Pro b natriuretic peptide  . Cardiac monitoring  . Maintain IV access  . ED EKG   Medications  nitroGLYCERIN 0.2 mg/mL in  dextrose 5 % infusion (5 mcg/min Intravenous New Bag 01/16/11 1308)  furosemide (LASIX) 10 MG/ML injection 40 mg (40 mg Intravenous Given 01/16/11 1305)  13:48 Pt re-check. Discussed lab and diagnostic results with Pt indicating CHF and anemia which can account for SOB. Discussed plan to perform rectal exam to check for internal bleeding.  4:14 PM I spoke with Dr. Megan Mans.  He agreed to admit the patient.  For treatment of her new onset CHF and anemia due to GI bleed.  He gave  orders to the nurse for the patient to be admitted.   CRITICAL CARE NOTE: Critical care time was provided for 30 minutes exclusive of separately billable procedures and treating other patients.  This was necessary to treat or prevent further deterioration of the following condition(s) hypertensive emergency with systolic pressure of 220 in the setting of CHF. This involved direct bedside patient care, speaking with family members, review of past medical records, reviewing the results of the laboratory and diagnostic studies, consulting with other physicians, as well as evaluating the effectiveness of the therapy instituted as described.  MDM:CHF Hypertensive emergency GI bleed Anemia  SCRIBE ATTESTATION:I personally performed the services described in this documentation, which was scribed in my presence. The recorded information has been reviewed and considered. Shelby Stairs, MD     Shelby Stairs, MD 01/16/11 1615  Shelby Stairs, MD 01/16/11 1616  Shelby Stairs, MD 01/16/11 1616

## 2011-01-17 ENCOUNTER — Encounter (HOSPITAL_COMMUNITY): Payer: Self-pay | Admitting: Cardiology

## 2011-01-17 ENCOUNTER — Inpatient Hospital Stay (HOSPITAL_COMMUNITY): Payer: Medicare Other

## 2011-01-17 DIAGNOSIS — R0602 Shortness of breath: Secondary | ICD-10-CM

## 2011-01-17 DIAGNOSIS — K921 Melena: Secondary | ICD-10-CM

## 2011-01-17 DIAGNOSIS — D509 Iron deficiency anemia, unspecified: Secondary | ICD-10-CM

## 2011-01-17 DIAGNOSIS — I369 Nonrheumatic tricuspid valve disorder, unspecified: Secondary | ICD-10-CM

## 2011-01-17 LAB — GLUCOSE, CAPILLARY: Glucose-Capillary: 121 mg/dL — ABNORMAL HIGH (ref 70–99)

## 2011-01-17 LAB — BLOOD GAS, ARTERIAL
O2 Content: 1 L/min
pH, Arterial: 7.22 — ABNORMAL LOW (ref 7.350–7.400)

## 2011-01-17 LAB — COMPREHENSIVE METABOLIC PANEL
ALT: 13 U/L (ref 0–35)
AST: 12 U/L (ref 0–37)
Alkaline Phosphatase: 99 U/L (ref 39–117)
CO2: 9 mEq/L — CL (ref 19–32)
Calcium: 5.1 mg/dL — CL (ref 8.4–10.5)
GFR calc non Af Amer: 5 mL/min — ABNORMAL LOW (ref >60)
Potassium: 4.7 mEq/L (ref 3.5–5.1)
Sodium: 140 mEq/L (ref 135–145)
Total Protein: 5.6 g/dL — ABNORMAL LOW (ref 6.0–8.3)

## 2011-01-17 LAB — URINALYSIS, ROUTINE W REFLEX MICROSCOPIC
Glucose, UA: 100 mg/dL — AB
Protein, ur: 100 mg/dL — AB
Specific Gravity, Urine: 1.025 (ref 1.005–1.030)

## 2011-01-17 LAB — HEMOGLOBIN AND HEMATOCRIT, BLOOD: HCT: 22 % — ABNORMAL LOW (ref 36.0–46.0)

## 2011-01-17 LAB — CBC
HCT: 24.8 % — ABNORMAL LOW (ref 36.0–46.0)
Hemoglobin: 8.5 g/dL — ABNORMAL LOW (ref 12.0–15.0)
MCH: 30 pg (ref 26.0–34.0)
MCHC: 34.3 g/dL (ref 30.0–36.0)

## 2011-01-17 LAB — URINE MICROSCOPIC-ADD ON

## 2011-01-17 LAB — PHOSPHORUS: Phosphorus: 10.7 mg/dL — ABNORMAL HIGH (ref 2.3–4.6)

## 2011-01-17 MED ORDER — SODIUM BICARBONATE 8.4 % IV SOLN
INTRAVENOUS | Status: DC
  Administered 2011-01-17 – 2011-01-18 (×3): via INTRAVENOUS
  Filled 2011-01-17 (×4): qty 1000

## 2011-01-17 MED ORDER — FUROSEMIDE 10 MG/ML IJ SOLN
200.0000 mg | Freq: Once | INTRAMUSCULAR | Status: AC
  Administered 2011-01-17: 200 mg via INTRAVENOUS
  Filled 2011-01-17: qty 20

## 2011-01-17 MED ORDER — SODIUM CHLORIDE 0.45 % IV SOLN
INTRAVENOUS | Status: DC
  Administered 2011-01-17: 08:00:00 via INTRAVENOUS

## 2011-01-17 MED ORDER — PNEUMOCOCCAL VAC POLYVALENT 25 MCG/0.5ML IJ INJ
0.5000 mL | INJECTION | Freq: Once | INTRAMUSCULAR | Status: AC
  Administered 2011-01-17: 0.5 mL via INTRAMUSCULAR
  Filled 2011-01-17: qty 0.5

## 2011-01-17 MED ORDER — DEXTROSE-NACL 5-0.45 % IV SOLN: INTRAVENOUS | Status: DC

## 2011-01-17 MED ORDER — CIPROFLOXACIN HCL 250 MG PO TABS
250.0000 mg | ORAL_TABLET | Freq: Two times a day (BID) | ORAL | Status: DC
  Administered 2011-01-17 – 2011-01-19 (×5): 250 mg via ORAL
  Filled 2011-01-17 (×5): qty 1

## 2011-01-17 MED ORDER — CLONIDINE HCL 0.1 MG PO TABS
0.1000 mg | ORAL_TABLET | Freq: Three times a day (TID) | ORAL | Status: DC | PRN
  Administered 2011-01-23: 0.1 mg via ORAL
  Filled 2011-01-17 (×2): qty 1

## 2011-01-17 MED ORDER — FUROSEMIDE 10 MG/ML IJ SOLN
200.0000 mg/h | Freq: Once | INTRAVENOUS | Status: DC
  Filled 2011-01-17: qty 25

## 2011-01-17 MED ORDER — CALCIUM CARBONATE 1250 (500 CA) MG PO TABS
2.0000 | ORAL_TABLET | Freq: Every day | ORAL | Status: DC
  Administered 2011-01-17 – 2011-01-18 (×2): 1000 mg via ORAL
  Filled 2011-01-17: qty 1
  Filled 2011-01-17: qty 2
  Filled 2011-01-17: qty 1
  Filled 2011-01-17 (×2): qty 2

## 2011-01-17 MED ORDER — FUROSEMIDE 10 MG/ML IJ SOLN
200.0000 mg | Freq: Two times a day (BID) | INTRAVENOUS | Status: DC
  Administered 2011-01-17 – 2011-01-20 (×7): 200 mg via INTRAVENOUS
  Filled 2011-01-17 (×12): qty 20

## 2011-01-17 MED ORDER — CALCIUM CARBONATE 1250 (500 CA) MG PO TABS
4.0000 | ORAL_TABLET | Freq: Three times a day (TID) | ORAL | Status: DC
  Administered 2011-01-17 – 2011-01-19 (×5): 2000 mg via ORAL
  Filled 2011-01-17: qty 4
  Filled 2011-01-17: qty 2
  Filled 2011-01-17 (×3): qty 4

## 2011-01-17 NOTE — Progress Notes (Signed)
Patient is limited historian.  Neglect of care is apparent, weeping of legs, with odor.   SOB for 3 weeks. Unsure if self neglect or lack of education of present diagnosis.  Received 2 units of blood overnight.  Tolerated well.  Has no c/o.

## 2011-01-17 NOTE — Consult Note (Signed)
Reason for Consult: guaiac positive stool, anemia Referring Physician:  Dr. Moss Mc [name] is an 65 y.o. female.  HPI:[name] is a 65 yr old white female admitted thru the ED with SOB (CHF) and anemia. She is a patient of Dr. Renard Matter but has not seen him in over a year.  She had been SOB for over 3 weeks.  She is especially SOB when lying flat. Noted on admission her hemoglobin was 7.0.   Her stool was guaiac positive in the ED. Her BUN and Creatinine were also elevated.  She denies a prior history of bright red rectal bleeding or melena.  She says she did have some edema to her lower extremities.  Her appetite has been okay. No recent weight loss per husband.  She tells me she had a colonoscopy in the past but could not tell me if if was normal. She has been off her home medications and insulin for over a year due to the expense.  RN on duty today says her stool was yellow in color this am and no blood.  Past Medical History  Diagnosis Date  . Type 2 diabetes mellitus   . Hypercholesteremia   . Essential hypertension, benign   . UTI (urinary tract infection)   . Ruptured appendix   . Iron deficiency anemia   . Renal insufficiency     Creatinine 2.3 in 11/08  . Noncompliance     Past Surgical History  Procedure Date  . Appendectomy 2008    Exploratory laparotomy  . Left carpal tunnel sugrey   . Stitch granuloma excision 2009    Family History  Problem Relation Age of Onset  . Hypertension      Social History:  reports that she has never smoked. She has never used smokeless tobacco. She reports that she does not drink alcohol or use illicit drugs.  Allergies: No Known Allergies  Medications: I have reviewed the patient's current medications. Current Facility-Administered Medications  Medication Dose Route Frequency Provider Last Rate Last Dose  . 0.45 % sodium chloride infusion   Intravenous Continuous Belayenh S Befekadu 75 mL/hr at 01/17/11 1200    . calcium  carbonate (OS-CAL - dosed in mg of elemental calcium) tablet 1,000 mg of elemental calcium  2 tablet Oral QHS Belayenh S Befekadu      . calcium carbonate (OS-CAL - dosed in mg of elemental calcium) tablet 2,000 mg of elemental calcium  4 tablet Oral TID WC Belayenh S Befekadu      . ciprofloxacin (CIPRO) tablet 250 mg  250 mg Oral BID Angus G McInnis   250 mg at 01/17/11 1100  . cloNIDine (CATAPRES) tablet 0.1 mg  0.1 mg Oral TID PRN Angus G McInnis      . dextrose 5 % and 0.45% NaCl 1,000 mL with sodium bicarbonate 1 mEq/mL 50 mEq infusion   Intravenous Continuous Mady Gemma, PHARMD 125 mL/hr at 01/17/11 1500    . furosemide (LASIX) 10 MG/ML 200 mg in dextrose 5 % 50 mL IVPB  200 mg Intravenous BID Belayenh S Befekadu      . furosemide (LASIX) 10 MG/ML 200 mg in dextrose 5 % 50 mL IVPB  200 mg Intravenous Once Mady Gemma, PHARMD   200 mg at 01/17/11 1345  . insulin aspart (novoLOG) injection 0-15 Units  0-15 Units Subcutaneous TID WC Angus G McInnis      . insulin aspart (novoLOG) injection 0-5 Units  0-5 Units Subcutaneous QHS  Angus G McInnis      . pantoprazole (PROTONIX) injection 40 mg  40 mg Intravenous Q24H Angus G McInnis      . pneumococcal 23 valent vaccine (PNU-IMMUNE) injection 0.5 mL  0.5 mL Intramuscular Once Angus G McInnis      . DISCONTD: dextrose 5 %-0.45 % sodium chloride infusion   Intravenous Continuous Belayenh S Befekadu      . DISCONTD: furosemide (LASIX) 10 MG/ML 250 mg in dextrose 5 % 250 mL infusion  200 mg/hr Intravenous Once [employer]      . DISCONTD: furosemide (LASIX) 10 MG/ML injection 40 mg  40 mg Intravenous BID Angus G McInnis   40 mg at 01/16/11 2001  . DISCONTD: nitroGLYCERIN 0.2 mg/mL in dextrose 5 % infusion  5 mcg/min Intravenous Titrated [name], MD 1.5 mL/hr at 01/16/11 1810 5 mcg/min at 01/16/11 1810  . DISCONTD: nitroGLYCERIN 0.2 mg/mL in dextrose 5 % infusion  2-200 mcg/min Intravenous Titrated Angus G McInnis         Results for orders placed during the hospital encounter of 01/16/11 (from the past 48 hour(s))  CBC     Status: Abnormal   Collection Time   01/16/11 12:21 PM      Component Value Range Comment   WBC 7.7  4.0 - 10.5 (K/uL)    RBC 2.63 (*) 3.87 - 5.11 (MIL/uL)    Hemoglobin 7.8 (*) 12.0 - 15.0 (g/dL)    HCT 47.8 (*) 29.5 - 46.0 (%)    MCV 87.1  78.0 - 100.0 (fL)    MCH 29.7  26.0 - 34.0 (pg)    MCHC 34.1  30.0 - 36.0 (g/dL)    RDW 62.1 (*) 30.8 - 15.5 (%)    Platelets 299  150 - 400 (K/uL)   DIFFERENTIAL     Status: Abnormal   Collection Time   01/16/11 12:21 PM      Component Value Range Comment   Neutrophils Relative 83 (*) 43 - 77 (%)    Neutro Abs 6.4  1.7 - 7.7 (K/uL)    Lymphocytes Relative 7 (*) 12 - 46 (%)    Lymphs Abs 0.6 (*) 0.7 - 4.0 (K/uL)    Monocytes Relative 9  3 - 12 (%)    Monocytes Absolute 0.7  0.1 - 1.0 (K/uL)    Eosinophils Relative 1  0 - 5 (%)    Eosinophils Absolute 0.1  0.0 - 0.7 (K/uL)    Basophils Relative 0  0 - 1 (%)    Basophils Absolute 0.0  0.0 - 0.1 (K/uL)   BASIC METABOLIC PANEL     Status: Abnormal   Collection Time   01/16/11 12:21 PM      Component Value Range Comment   Sodium 142  135 - 145 (mEq/L)    Potassium 4.9  3.5 - 5.1 (mEq/L)    Chloride 109  96 - 112 (mEq/L)    CO2 9 (*) 19 - 32 (mEq/L)    Glucose, Bld 95  70 - 99 (mg/dL)    BUN 97 (*) 6 - 23 (mg/dL)    Creatinine, Ser 6.57 (*) 0.50 - 1.10 (mg/dL)    Calcium 5.4 (*) 8.4 - 10.5 (mg/dL)    GFR calc non Af Amer 5 (*) >60 (mL/min)    GFR calc Af Amer 6 (*) >60 (mL/min)   PRO B NATRIURETIC PEPTIDE     Status: Abnormal   Collection Time   01/16/11 12:21 PM  Component Value Range Comment   BNP, POC >70000.0 (*) 0 - 125 (pg/mL)   POCT I-STAT TROPONIN I     Status: Normal   Collection Time   01/16/11  2:41 PM      Component Value Range Comment   Troponin i, poc 0.01  0.00 - 0.08 (ng/mL)    Comment 3            OCCULT BLOOD, POC DEVICE     Status: Normal   Collection Time    01/16/11  2:55 PM      Component Value Range Comment   Fecal Occult Bld POSITIVE     GLUCOSE, CAPILLARY     Status: Normal   Collection Time   01/16/11  6:45 PM      Component Value Range Comment   Glucose-Capillary 73  70 - 99 (mg/dL)    Comment 1 Notify RN      Comment 2 Documented in Chart     MRSA PCR SCREENING     Status: Normal   Collection Time   01/16/11  7:00 PM      Component Value Range Comment   MRSA by PCR NEGATIVE  NEGATIVE    TYPE AND SCREEN     Status: Normal (Preliminary result)   Collection Time   01/16/11  7:20 PM      Component Value Range Comment   ABO/RH(D) O POS      Antibody Screen NEG      Sample Expiration 01/19/2011      Unit Number [id number]      Blood Component Type RED CELLS,LR      Unit division 00      Status of Unit ISSUED      Transfusion Status OK TO TRANSFUSE      Crossmatch Result Compatible      Unit Number [id number]      Blood Component Type RED CELLS,LR      Unit division 00      Status of Unit ISSUED,FINAL      Transfusion Status OK TO TRANSFUSE      Crossmatch Result Compatible      Unit Number [id number]      Blood Component Type RED CELLS,LR      Unit division 00      Status of Unit ALLOCATED      Transfusion Status OK TO TRANSFUSE      Crossmatch Result Compatible     PREPARE RBC (CROSSMATCH)     Status: Normal   Collection Time   01/16/11  7:20 PM      Component Value Range Comment   Order Confirmation ORDER PROCESSED BY BLOOD BANK     GLUCOSE, CAPILLARY     Status: Abnormal   Collection Time   01/16/11  9:59 PM      Component Value Range Comment   Glucose-Capillary 131 (*) 70 - 99 (mg/dL)    Comment 1 Documented in Chart      Comment 2 Notify RN     HEMOGLOBIN AND HEMATOCRIT, BLOOD     Status: Abnormal   Collection Time   01/17/11  2:19 AM      Component Value Range Comment   Hemoglobin 7.6 (*) 12.0 - 15.0 (g/dL)    HCT 62.9 (*) 52.8 - 46.0 (%)   PREPARE RBC (CROSSMATCH)     Status: Normal   Collection Time    01/17/11  3:00 AM      Component Value Range Comment  Order Confirmation ORDER PROCESSED BY BLOOD BANK     GLUCOSE, CAPILLARY     Status: Normal   Collection Time   01/17/11  7:09 AM      Component Value Range Comment   Glucose-Capillary 78  70 - 99 (mg/dL)   URINALYSIS, ROUTINE W REFLEX MICROSCOPIC     Status: Abnormal   Collection Time   01/17/11  7:58 AM      Component Value Range Comment   Color, Urine YELLOW  YELLOW     Appearance CLEAR  CLEAR     Specific Gravity, Urine 1.025  1.005 - 1.030     pH 5.5  5.0 - 8.0     Glucose, UA 100 (*) NEGATIVE (mg/dL)    Hgb urine dipstick LARGE (*) NEGATIVE     Bilirubin Urine NEGATIVE  NEGATIVE     Ketones, ur NEGATIVE  NEGATIVE (mg/dL)    Protein, ur 409 (*) NEGATIVE (mg/dL)    Urobilinogen, UA 0.2  0.0 - 1.0 (mg/dL)    Nitrite NEGATIVE  NEGATIVE     Leukocytes, UA SMALL (*) NEGATIVE    URINE MICROSCOPIC-ADD ON     Status: Abnormal   Collection Time   01/17/11  7:58 AM      Component Value Range Comment   Squamous Epithelial / LPF RARE  RARE     WBC, UA 11-20  <3 (WBC/hpf)    RBC / HPF 21-50  <3 (RBC/hpf)    Bacteria, UA FEW (*) RARE    COMPREHENSIVE METABOLIC PANEL     Status: Abnormal   Collection Time   01/17/11  9:42 AM      Component Value Range Comment   Sodium 140  135 - 145 (mEq/L)    Potassium 4.7  3.5 - 5.1 (mEq/L)    Chloride 112  96 - 112 (mEq/L)    CO2 9 (*) 19 - 32 (mEq/L)    Glucose, Bld 130 (*) 70 - 99 (mg/dL)    BUN 96 (*) 6 - 23 (mg/dL)    Creatinine, Ser 8.11 (*) 0.50 - 1.10 (mg/dL)    Calcium 5.1 (*) 8.4 - 10.5 (mg/dL)    Total Protein 5.6 (*) 6.0 - 8.3 (g/dL)    Albumin 2.7 (*) 3.5 - 5.2 (g/dL)    AST 12  0 - 37 (U/L)    ALT 13  0 - 35 (U/L)    Alkaline Phosphatase 99  39 - 117 (U/L)    Total Bilirubin 0.6  0.3 - 1.2 (mg/dL)    GFR calc non Af Amer 5 (*) >60 (mL/min)    GFR calc Af Amer 6 (*) >60 (mL/min)   CBC     Status: Abnormal   Collection Time   01/17/11  9:42 AM      Component Value Range Comment    WBC 7.2  4.0 - 10.5 (K/uL)    RBC 2.83 (*) 3.87 - 5.11 (MIL/uL)    Hemoglobin 8.5 (*) 12.0 - 15.0 (g/dL)    HCT 91.4 (*) 78.2 - 46.0 (%)    MCV 87.6  78.0 - 100.0 (fL)    MCH 30.0  26.0 - 34.0 (pg)    MCHC 34.3  30.0 - 36.0 (g/dL)    RDW 95.6 (*) 21.3 - 15.5 (%)    Platelets 241  150 - 400 (K/uL)   PHOSPHORUS     Status: Abnormal   Collection Time   01/17/11  9:44 AM      Component Value  Range Comment   Phosphorus 10.7 (*) 2.3 - 4.6 (mg/dL)   GLUCOSE, CAPILLARY     Status: Abnormal   Collection Time   01/17/11 11:37 AM      Component Value Range Comment   Glucose-Capillary 106 (*) 70 - 99 (mg/dL)   BLOOD GAS, ARTERIAL     Status: Abnormal   Collection Time   01/17/11 11:43 AM      Component Value Range Comment   O2 Content 1.0      Delivery systems NASAL CANNULA      pH, Arterial 7.220 (*) 7.350 - 7.400     pCO2 arterial 18.8 (*) 35.0 - 45.0 (mmHg)    pO2, Arterial 140.0 (*) 80.0 - 100.0 (mmHg)    Bicarbonate 7.4 (*) 20.0 - 24.0 (mEq/L)    TCO2 7.2  0 - 100 (mmol/L)    Acid-base deficit 18.9 (*) 0.0 - 2.0 (mmol/L)    O2 Saturation 98.3      Collection site LEFT RADIAL      Drawn by COLLECTED BY RT      Sample type ARTERIAL      Allens test (pass/fail) PASS  PASS    GLUCOSE, CAPILLARY     Status: Abnormal   Collection Time   01/17/11  4:21 PM      Component Value Range Comment   Glucose-Capillary 119 (*) 70 - 99 (mg/dL)    Comment 1 Documented in Chart      Comment 2 Notify RN       US Renal  01/17/2011  *RADIOLOGY REPORT*  Clinical Data: Renal failure, hypertension, diabetes, hypercholesterolemia  RENAL/URINARY TRACT ULTRASOUND COMPLETE  Comparison:  Portable exam 1428 hours compared to11/25/2008  Findings:  Right Kidney:  11.3 cm length.  Mild cortical thinning.  Increased cortical echogenicity.  No mass, hydronephrosis shadowing calcification.  No perinephric fluid.  Left Kidney:  10.5 cm length.  Minimal cortical thinning. Increased cortical echogenicity.  No mass,  hydronephrosis or shadowing calcification.  Bladder:  Decompressed by Foley catheter.  Incidental findings:  Moderate ascites.  IMPRESSION: Bilateral increased renal cortical echogenicity suggesting medical renal disease. No evidence of hydronephrosis. Moderate ascites.  Original Report Authenticated By: Lollie Marrow, M.D.   Dg Chest Portable 1 View  01/16/2011  *RADIOLOGY REPORT*  Clinical Data: Shortness of breath.  Wheezing.  PORTABLE CHEST - 1 VIEW  Comparison: Radiographs dated 03/22/2007 and 07/24/2003 and a CT scan of the abdomen dated 04/12/2007  Findings: Mild cardiomegaly with slight pulmonary vascular prominence and slight haziness at the lung bases suggesting mild edema.  Probable small bilateral pleural effusions.  Stable 12 mm nodule at the left lung base, described on the prior CT dated 04/12/2007.  IMPRESSION: Probable mild heart failure.  Mild edema with small effusions. Stable pulmonary nodule.  Original Report Authenticated By: Gwynn Burly, M.D.    Review of Systems  Constitutional: Negative for fever and weight loss.  Respiratory: Positive for shortness of breath.   Psychiatric/Behavioral: The patient is not nervous/anxious.    Blood pressure 121/66, pulse 59, temperature 98.1 F (36.7 C), temperature source Oral, resp. rate 17, height 5\' 2"  (1.575 m), weight 187 lb 6.3 oz (85 kg), SpO2 100.00%. Physical Exam  Alert, Skin warm and dry. Conjunctiva pale. Sclera anicteric. Oral mucosa is moist. No cervical lymphadenopathy.  Lungs clear, shallow. Heart regular, rate and rhythm.  Abdomen distended but soft. Bowel sounds are positive.  Umbilical hernia noted. Hardened area to lower abdomen which is ?scar tissue.  No edema to lower extremities.     Assessment/Plan: CHF, Anemia ? GI blood loss. She was guaiac positive in the ED.  I will discuss this case with Dr. Karilyn Cota. Patient is not medically stable at this time. Will get Iron studies on her today. We will continue to  monitor.  [name] W 01/17/2011, 5:06 PM

## 2011-01-17 NOTE — Consult Note (Signed)
Pt with generalized edema to BLE.  No open wounds or drainage to right leg.  Left leg with dry peeling skin and appearance consistent with venous stasis changes.  2 areas of partial thickness  Stasis ulcers to posterior legs, 1X.5X.2 cm and .5X.5X.1 cm.  Both with 50%yellow wound bed and 50%red, mod tan drainage, some odor.  Patchy areas of skin loss to anterior calf, pink dry woundbed.  Plan:  Aquacel to absorb drainage and provide antimicrobial benefits.  Will not plan to follow further unless reconsulted.  Thank-you, Cammie Mcgee, MSN, [employer]

## 2011-01-17 NOTE — Plan of Care (Signed)
Problem: Phase I Progression Outcomes Goal: Hemodynamically stable Outcome: Progressing Remains on nitroglycerin gtt at present

## 2011-01-17 NOTE — Consult Note (Addendum)
Clinical Summary [name] is a 65 y.o.female with history of chronic medical disease as outlined below, although no regular medication therapy over many years. She is admitted to the hospital with progressive shortness of breath, both with exertion and more recently at rest, dating back over the last several weeks. She denies any associated chest discomfort or palpitations. Perhaps some lower extremity edema, although she reports this has not been a major problem. She has had orthopnea. She indicates that her appetite has been unaffected, and reports that she has normal urine output, and no major changes in bowel habits.  She is noted to have evidence of volume overload by chest x-ray, with acute on chronic renal insufficiency, creatinine 8.2 up from 2.3 back in 2008, progressive anemia with a hemoglobin down to 7.6 and prior documented history of iron deficiency anemia. Stools are heme positive by fecal occult blood testing, and her BNP is markedly elevated at greater than 70,000. Troponin I level is normal.  Review of her history finds no prior documentation of CAD or cardiomyopathy, no obvious cardiac dysrhythmia. She has undergone no prior cardiac testing as best I can ascertain. We are consulted to assist with her management.   No Known Allergies  Medication list reviewed.  Past Medical History  Diagnosis Date  . Type 2 diabetes mellitus   . Hypercholesteremia   . Essential hypertension, benign   . UTI (urinary tract infection)   . Ruptured appendix   . Iron deficiency anemia   . Renal insufficiency     Creatinine 2.3 in 11/08  . Noncompliance     Past Surgical History  Procedure Date  . Appendectomy 2008    Exploratory laparotomy  . Left carpal tunnel sugrey   . Stitch granuloma excision 2009    Family History  Problem Relation Age of Onset  . Hypertension      Social History [name] reports that she has never smoked. She has never used smokeless tobacco. [name]  reports that she does not drink alcohol.  Review of Systems Denies any fevers or chills, no cough or hemoptysis. No bowel pain. No syncope. Otherwise negative except as outlined.  Physical Examination Filed Vitals:   01/17/11 0945  BP: 134/60  Pulse: 63  Temp:   Resp: 17  Overweight, chronically ill-appearing woman in no acute distress. HEENT: Conjunctiva and lids normal, oropharynx with poor condition. Neck: Supple, mildly elevated JVP, no obvious carotid bruits, no thyromegaly. Lungs: Decreased breath sounds at the bases, scattered rhonchi, no wheezing. Cardiac: Indistinct PMI with regular rate and rhythm, soft apical systolic murmur, no pericardial rub. Abdomen: Soft, nontender, bowel sounds present, no rebound. Extremities: Trace ankle edema, distal pulses one plus. Skin: Warm and dry. Musculoskeletal: No kyphosis. Neuropsychiatric: Alert and oriented x3, affect pleasant. Limited historian.  Laboratory data Lab Results  Component Value Date   WBC 7.2 01/17/2011   HGB 8.5* 01/17/2011   HCT 24.8* 01/17/2011   MCV 87.6 01/17/2011   PLT 241 01/17/2011    Lab Results  Component Value Date   CREATININE 8.21* 01/17/2011   BUN 96* 01/17/2011   NA 140 01/17/2011   K 4.7 01/17/2011   CL 112 01/17/2011   CO2 9* 01/17/2011   Lab Results  Component Value Date   ALT 13 01/17/2011   AST 12 01/17/2011   ALKPHOS 99 01/17/2011   BILITOT 0.6 01/17/2011      ECG Normal sinus rhythm with left atrial enlargement, poor anterior R-wave progression, rule out old  anterior infarct pattern, nonspecific ST changes. Prolonged QT interval.  Studies Chest x-ray 01/16/2011: IMPRESSION: Probable mild heart failure. Mild edema with small effusions. Stable pulmonary nodule.    Impression  1. Progressive shortness of breath with evidence of volume overload, possibility of systolic or diastolic heart failure to be evaluated further. She does have cardiomegaly, no S3 on examination. 2-D echocardiogram  has been ordered and is pending. ECG is abnormal, reviewed above.  2. Acute on chronic renal failure as outlined above. Nephrology is evaluating. She has been seen by Dr. Kristian Covey.  3. Iron deficiency anemia by history, heme positive stools, presumably blood loss anemia although may also be impacted by renal failure. Gastroenterology consult is pending.  4. Noncompliance with regular medication therapy with long-standing history of chronic illnesses including diabetes mellitus and hypertension. She most likely has evidence of end-organ damage at this point.  Recommendations  Plan to followup on the 2-D echocardiogram results. She is on high-dose Lasix as per Nephrology. Would add aspirin empirically if the etiology of her anemia becomes better understood, although clearly avoid ACE inhibitor therapy in light of her acute on chronic renal failure. Prior to adding any other cardiac specific medications, will need more structural information. Whether or not she needs further ischemic testing can be considered, although this will clearly be limited by her comorbid disease, history of noncompliance, and present status. We will follow with you.

## 2011-01-17 NOTE — Progress Notes (Signed)
*  PRELIMINARY RESULTS* Echocardiogram 2D Echocardiogram has been performed.  [name] Jane 01/17/2011, 2:06 PM 

## 2011-01-17 NOTE — Consult Note (Signed)
Reason for Consult: Renal failure Referring Physician: Dr. Ervin Knack LEATHIA FARNELL is an 65 y.o. female.  HPI: Is a patient who has a long-standing history of her diabetes presently was brought to the emergency room with complaints of her shortness of breath some orthopnea cough for the last couple of weeks her. According to the patient she started having some swelling a couple of weeks ago since then her swelling continues and she started having difficulty increasing especially when is lying down. Because of that she came to the emergency room. At this moment she denies any nausea no vomiting. Her appetite she said is okay. At this moment patient denies any previous history of her renal failure . And the patient also doesn't have any history of diabetic retinopathy . She denies also any her her history of proteinuria. Her patient seems to be her a poor historian . Because of her difficulty in taking medication patient seems to have not to take her medication . According to her Dr. Megan Mans patient seems to have her renal failure her from before her period at this moment we don't know how high her creatinine was her.  Past Medical History  Diagnosis Date  . Diabetes mellitus   . Hypercholesteremia   . Hypertension     Past Surgical History  Procedure Date  . Appendectomy     No family history on file.  Social History:  reports that she has never smoked. She does not have any smokeless tobacco history on file. She reports that she does not drink alcohol or use illicit drugs.  Allergies: No Known Allergies  Medications: I have reviewed the patient's current medications.  Results for orders placed during the hospital encounter of 01/16/11 (from the past 48 hour(s))  CBC     Status: Abnormal   Collection Time   01/16/11 12:21 PM      Component Value Range Comment   WBC 7.7  4.0 - 10.5 (K/uL)    RBC 2.63 (*) 3.87 - 5.11 (MIL/uL)    Hemoglobin 7.8 (*) 12.0 - 15.0 (g/dL)    HCT 28.4 (*)  13.2 - 46.0 (%)    MCV 87.1  78.0 - 100.0 (fL)    MCH 29.7  26.0 - 34.0 (pg)    MCHC 34.1  30.0 - 36.0 (g/dL)    RDW 44.0 (*) 10.2 - 15.5 (%)    Platelets 299  150 - 400 (K/uL)   DIFFERENTIAL     Status: Abnormal   Collection Time   01/16/11 12:21 PM      Component Value Range Comment   Neutrophils Relative 83 (*) 43 - 77 (%)    Neutro Abs 6.4  1.7 - 7.7 (K/uL)    Lymphocytes Relative 7 (*) 12 - 46 (%)    Lymphs Abs 0.6 (*) 0.7 - 4.0 (K/uL)    Monocytes Relative 9  3 - 12 (%)    Monocytes Absolute 0.7  0.1 - 1.0 (K/uL)    Eosinophils Relative 1  0 - 5 (%)    Eosinophils Absolute 0.1  0.0 - 0.7 (K/uL)    Basophils Relative 0  0 - 1 (%)    Basophils Absolute 0.0  0.0 - 0.1 (K/uL)   BASIC METABOLIC PANEL     Status: Abnormal   Collection Time   01/16/11 12:21 PM      Component Value Range Comment   Sodium 142  135 - 145 (mEq/L)    Potassium 4.9  3.5 -  5.1 (mEq/L)    Chloride 109  96 - 112 (mEq/L)    CO2 9 (*) 19 - 32 (mEq/L)    Glucose, Bld 95  70 - 99 (mg/dL)    BUN 97 (*) 6 - 23 (mg/dL)    Creatinine, Ser 1.61 (*) 0.50 - 1.10 (mg/dL)    Calcium 5.4 (*) 8.4 - 10.5 (mg/dL)    GFR calc non Af Amer 5 (*) >60 (mL/min)    GFR calc Af Amer 6 (*) >60 (mL/min)   PRO B NATRIURETIC PEPTIDE     Status: Abnormal   Collection Time   01/16/11 12:21 PM      Component Value Range Comment   BNP, POC >70000.0 (*) 0 - 125 (pg/mL)   POCT I-STAT TROPONIN I     Status: Normal   Collection Time   01/16/11  2:41 PM      Component Value Range Comment   Troponin i, poc 0.01  0.00 - 0.08 (ng/mL)    Comment 3            OCCULT BLOOD, POC DEVICE     Status: Normal   Collection Time   01/16/11  2:55 PM      Component Value Range Comment   Fecal Occult Bld POSITIVE     GLUCOSE, CAPILLARY     Status: Normal   Collection Time   01/16/11  6:45 PM      Component Value Range Comment   Glucose-Capillary 73  70 - 99 (mg/dL)    Comment 1 Notify RN      Comment 2 Documented in Chart     MRSA PCR SCREENING      Status: Normal   Collection Time   01/16/11  7:00 PM      Component Value Range Comment   MRSA by PCR NEGATIVE  NEGATIVE    TYPE AND SCREEN     Status: Normal (Preliminary result)   Collection Time   01/16/11  7:20 PM      Component Value Range Comment   ABO/RH(D) O POS      Antibody Screen NEG      Sample Expiration 01/19/2011      Unit Number [id number]      Blood Component Type RED CELLS,LR      Unit division 00      Status of Unit ISSUED      Transfusion Status OK TO TRANSFUSE      Crossmatch Result Compatible      Unit Number [id number]      Blood Component Type RED CELLS,LR      Unit division 00      Status of Unit ISSUED,FINAL      Transfusion Status OK TO TRANSFUSE      Crossmatch Result Compatible      Unit Number [id number]      Blood Component Type RED CELLS,LR      Unit division 00      Status of Unit ALLOCATED      Transfusion Status OK TO TRANSFUSE      Crossmatch Result Compatible     PREPARE RBC (CROSSMATCH)     Status: Normal   Collection Time   01/16/11  7:20 PM      Component Value Range Comment   Order Confirmation ORDER PROCESSED BY BLOOD BANK     GLUCOSE, CAPILLARY     Status: Abnormal   Collection Time   01/16/11  9:59 PM      Component  Value Range Comment   Glucose-Capillary 131 (*) 70 - 99 (mg/dL)    Comment 1 Documented in Chart      Comment 2 Notify RN     HEMOGLOBIN AND HEMATOCRIT, BLOOD     Status: Abnormal   Collection Time   01/17/11  2:19 AM      Component Value Range Comment   Hemoglobin 7.6 (*) 12.0 - 15.0 (g/dL)    HCT 04.5 (*) 40.9 - 46.0 (%)   PREPARE RBC (CROSSMATCH)     Status: Normal   Collection Time   01/17/11  3:00 AM      Component Value Range Comment   Order Confirmation ORDER PROCESSED BY BLOOD BANK     GLUCOSE, CAPILLARY     Status: Normal   Collection Time   01/17/11  7:09 AM      Component Value Range Comment   Glucose-Capillary 78  70 - 99 (mg/dL)     Dg Chest Portable 1 View  01/16/2011  *RADIOLOGY REPORT*   Clinical Data: Shortness of breath.  Wheezing.  PORTABLE CHEST - 1 VIEW  Comparison: Radiographs dated 03/22/2007 and 07/24/2003 and a CT scan of the abdomen dated 04/12/2007  Findings: Mild cardiomegaly with slight pulmonary vascular prominence and slight haziness at the lung bases suggesting mild edema.  Probable small bilateral pleural effusions.  Stable 12 mm nodule at the left lung base, described on the prior CT dated 04/12/2007.  IMPRESSION: Probable mild heart failure.  Mild edema with small effusions. Stable pulmonary nodule.  Original Report Authenticated By: Gwynn Burly, M.D.    Review of Systems  Constitutional: Negative for chills and weight loss.  Respiratory: Positive for shortness of breath. Negative for cough, hemoptysis, sputum production and wheezing.   Cardiovascular: Positive for orthopnea, leg swelling and PND. Negative for chest pain and palpitations.  Gastrointestinal: Negative for nausea, vomiting and abdominal pain.  Neurological: Positive for weakness.   Blood pressure 123/62, pulse 68, temperature 98 F (36.7 C), temperature source Oral, resp. rate 19, height 5\' 2"  (1.575 m), weight 85 kg (187 lb 6.3 oz), SpO2 97.00%. Physical Exam  Eyes: Conjunctivae are normal. No scleral icterus.  Cardiovascular: Normal rate, regular rhythm and normal heart sounds.  Exam reveals no friction rub.   No murmur heard. Respiratory: No respiratory distress. She has wheezes. She has no rales.  GI: She exhibits distension. She exhibits no mass. There is no tenderness. There is no rebound and no guarding.  Musculoskeletal: She exhibits edema. She exhibits no tenderness.    Assessment/Plan: Problem #1 renal failure at this moment the possibly acute kidney injury superimposed on chronic. However her as this moment we don't have a baseline to compare and. According to the patient she denies any her nausea vomiting or loss of her appetite or loss of weight the etiology for her renal  failure at this moment is not clear possibly her ATN. Since patient has long-standing history of diabetes she might have underlying diabetic nephropathy however her since don't have UA not sure whether it and she has proteinuria. Problem #2 anemia patient is Hemoccult positive he is probably she might have iron deficiency anemia. If her renal function is chronic then this also occurred contribute towards her anemia. Problem #3 history of hypertension blood pressure seems to be high presently she is on nitroglycerin her blood pressure has improved. Problem #4 low CO2 possibly metabolic Problem #5 history of her hypocalcemia at this moment I don't have her calcium level and  also a don't have albumin to see the extent of her hypocalcemia. Patient however seems to be a symptomatic. Problem #6 history of charity Problem #7 history of CHF Problem #8 history of difficulty breathing and orthopnea possibly a compression of her severe anemia and also her CHF presently she says she is feeling better. Problem #9 history of lung nodule. Recommendation we'll do compressive metabolic panel We'll check her UA and if she has some protein probably would do 24-hour urine for protein and immunoelectrophoresis and creatinine clearance We'll start patient on half-normal saline at 75 cc per hour agree with blood transfusion We'll start the patient on Lasix 200 mg IV twice a day We'll check her ABG And the if her her urine output doesn't improve patient may require hemodialysis and I have discussed that her with the patient. We'll do ultrasound of the kidneys to evaluate kidney size and rule out obstruction.  Abrar Bilton S 01/17/2011, 7:19 AM

## 2011-01-17 NOTE — Progress Notes (Signed)
NAMEMarland Kitchen  ROCKLYN, MAYBERRY                ACCOUNT NO.:  [id number]  MEDICAL RECORD NO.:  [id number]  LOCATION:  [facility]                         FACILITY:  [facility]  PHYSICIAN:  Argel Pablo G. Courtland Reas, MD   DATE OF BIRTH:  04-01-1946  DATE OF PROCEDURE: DATE OF DISCHARGE:                                PROGRESS NOTE   This patient was admitted to the hospital yesterday with the following problems:  Hypertension, congestive heart failure with markedly elevated BNP, and mild pleural effusion, GI bleed with anemia, azotemia, poorly controlled diabetes - insulin dependent, ventral hernia.  She did receive 2 units of packed RBCs last evening.  Her hemoglobin has not returned from labs this a.m.  She does have markedly elevated BNP over 70,000 and positive fecal occult blood and markedly elevated creatinine 8.22 with a BUN 97.  She was putting out very little urine at that moment in spite of Lasix.  OBJECTIVE:  VITAL SIGNS:  Blood pressure 123/62, respirations 19, pulse 58, temperature 98. LUNGS:  Diminished breath sounds. HEART:  Regular rhythm. ABDOMEN:  The patient has a distended lower abdomen and umbilical hernia. EXTREMITIES:  Evidence of bilateral edema and stasis changes.  ASSESSMENT:  The patient was admitted with hypertension, congestive heart failure with markedly elevated BNP, pleural effusion, gastrointestinal bleed with anemia, azotemia, poorly controlled diabetes, ventral hernia.  PLAN:  Obtain Nephrology consult.  I discussed this with Dr. Kristian Covey of Nephrology who will see the patient today.  The patient also is to be seen by Gastroenterology Service, Dr. Karilyn Cota.  She will continue to have hemoglobin/hematocrit monitored and blood transfusion as needed.  The patient also will be seen by Cardiology for further evaluation of cardiac status, and we will order a 2-D echo which will be done today. Continue current regimen.  We will discontinue nitroglycerin drip and start clonidine p.r.n.  to maintain blood pressure below 150 systolic, continue to monitor Is and Os, continue current regimen.     Caroly Purewal G. Renard Matter, MD     AGM/MEDQ  D:  01/17/2011  T:  01/17/2011  Job:  [id number]

## 2011-01-17 NOTE — Progress Notes (Signed)
UR Chart Review Completed  

## 2011-01-18 ENCOUNTER — Inpatient Hospital Stay (HOSPITAL_COMMUNITY): Payer: Medicare Other

## 2011-01-18 DIAGNOSIS — N186 End stage renal disease: Secondary | ICD-10-CM

## 2011-01-18 DIAGNOSIS — I5023 Acute on chronic systolic (congestive) heart failure: Secondary | ICD-10-CM

## 2011-01-18 DIAGNOSIS — E872 Acidosis, unspecified: Secondary | ICD-10-CM | POA: Diagnosis present

## 2011-01-18 DIAGNOSIS — N189 Chronic kidney disease, unspecified: Secondary | ICD-10-CM | POA: Diagnosis present

## 2011-01-18 DIAGNOSIS — I509 Heart failure, unspecified: Secondary | ICD-10-CM | POA: Diagnosis present

## 2011-01-18 DIAGNOSIS — D631 Anemia in chronic kidney disease: Secondary | ICD-10-CM | POA: Diagnosis present

## 2011-01-18 LAB — DIFFERENTIAL
Basophils Absolute: 0 10*3/uL (ref 0.0–0.1)
Basophils Relative: 0 % (ref 0–1)
Lymphocytes Relative: 11 % — ABNORMAL LOW (ref 12–46)
Monocytes Absolute: 1 10*3/uL (ref 0.1–1.0)
Neutro Abs: 6.1 10*3/uL (ref 1.7–7.7)
Neutrophils Relative %: 74 % (ref 43–77)

## 2011-01-18 LAB — CBC
HCT: 26 % — ABNORMAL LOW (ref 36.0–46.0)
MCHC: 35.4 g/dL (ref 30.0–36.0)
Platelets: 226 10*3/uL (ref 150–400)
RDW: 15.8 % — ABNORMAL HIGH (ref 11.5–15.5)
WBC: 8.2 10*3/uL (ref 4.0–10.5)

## 2011-01-18 LAB — GLUCOSE, CAPILLARY
Glucose-Capillary: 126 mg/dL — ABNORMAL HIGH (ref 70–99)
Glucose-Capillary: 97 mg/dL (ref 70–99)
Glucose-Capillary: 112 mg/dL — ABNORMAL HIGH (ref 70–99)

## 2011-01-18 LAB — BASIC METABOLIC PANEL
BUN: 96 mg/dL — ABNORMAL HIGH (ref 6–23)
Chloride: 107 mEq/L (ref 96–112)
Creatinine, Ser: 9.19 mg/dL — ABNORMAL HIGH (ref 0.50–1.10)
GFR calc Af Amer: 5 mL/min — ABNORMAL LOW (ref >60)
GFR calc non Af Amer: 4 mL/min — ABNORMAL LOW (ref >60)

## 2011-01-18 LAB — IRON AND TIBC: UIBC: <55 ug/dL — ABNORMAL LOW (ref 125–400)

## 2011-01-18 LAB — PRO B NATRIURETIC PEPTIDE: Pro B Natriuretic peptide (BNP): 70000 pg/mL — ABNORMAL HIGH (ref 0–125)

## 2011-01-18 LAB — PHOSPHORUS: Phosphorus: 10.3 mg/dL — ABNORMAL HIGH (ref 2.3–4.6)

## 2011-01-18 LAB — ALT: ALT: 13 U/L (ref 0–35)

## 2011-01-18 MED ORDER — SODIUM CHLORIDE 0.9 % IV SOLN: 100.0000 mL | INTRAVENOUS | Status: DC | PRN

## 2011-01-18 MED ORDER — HEPARIN SODIUM (PORCINE) 1000 UNIT/ML DIALYSIS
500.0000 [IU]/h | INTRAMUSCULAR | Status: DC | PRN
  Filled 2011-01-18: qty 1

## 2011-01-18 MED ORDER — TUBERCULIN PPD 5 UNIT/0.1ML ID SOLN
5.0000 [IU] | Freq: Once | INTRADERMAL | Status: AC
  Administered 2011-01-18: 5 [IU] via INTRADERMAL
  Filled 2011-01-18: qty 0.1

## 2011-01-18 MED ORDER — HYDRALAZINE HCL 10 MG PO TABS
10.0000 mg | ORAL_TABLET | Freq: Three times a day (TID) | ORAL | Status: DC
  Administered 2011-01-18 – 2011-01-19 (×4): 10 mg via ORAL
  Filled 2011-01-18 (×8): qty 1

## 2011-01-18 MED ORDER — SODIUM CHLORIDE 0.9 % IJ SOLN
INTRAMUSCULAR | Status: AC
  Administered 2011-01-18: 10 mL
  Filled 2011-01-18: qty 10

## 2011-01-18 MED ORDER — LIDOCAINE HCL (PF) 1 % IJ SOLN
INTRAMUSCULAR | Status: AC
  Filled 2011-01-18: qty 5

## 2011-01-18 MED ORDER — HEPARIN SODIUM (PORCINE) 1000 UNIT/ML DIALYSIS
1000.0000 [IU] | INTRAMUSCULAR | Status: DC | PRN
  Administered 2011-01-19: 1000 [IU] via INTRAVENOUS_CENTRAL
  Filled 2011-01-18: qty 1

## 2011-01-18 MED ORDER — HEPARIN SODIUM (PORCINE) 1000 UNIT/ML DIALYSIS
5000.0000 [IU]/h | INTRAMUSCULAR | Status: DC | PRN
  Filled 2011-01-18: qty 1

## 2011-01-18 NOTE — Consult Note (Addendum)
1. Acute (on chronic?) systolic CHF, LVEF 20-25%. 2. Probably progressive chronic renal failure. 3. Anemia, question component of blood loss and related to chronic disease. 4. History of noncompliance with medical therapy for chronic medical conditions including DM2 and hypertension - likely has end organ disease at this point.   Patient seen and examined. Discussed with [name]. With attempt at diuresis, creatinine has worsened and UOP is inadequate. Dr. Kristian Covey indicates the renal failure is most likely chronic and progressive based on labwork, renal US, and clinical presentation. Echocardiogram shows that she also has a cardiomyopathy with LVEF 20-25%, etiology not defined, but suspect mixed with some component of underlying ischemic heart disease based on her risk factors. Low output heart failure does not appear to be the case as she is not hypotensive or tachycardic, LFT's are normal, appetite has been normal. Not certain that inotropes would be effective course at this time. Plan for now is to initiate hemodialysis per Dr. Kristian Covey for volume management. We will start Hydralazine for afterload reduction, hold off beta blocker until volume status is better. May be able to eventually transition to ACE-I if in fact she has ESRD. Deferring ischemic work-up at this point.

## 2011-01-18 NOTE — Procedures (Signed)
Central Venous Catheter Insertion Procedure Note [name] [id number] [birth year]  Procedure: Insertion of Central Venous Catheter Indications: Dialysis  Procedure Details Consent: Risks of procedure as well as the alternatives and risks of each were explained to the (patient/caregiver).  Consent for procedure obtained. Time Out: Verified patient identification, verified procedure, site/side was marked, verified correct patient position, special equipment/implants available, medications/allergies/relevent history reviewed, required imaging and test results available.  Performed  Maximum sterile technique was used including antiseptics, cap, gloves, gown, hand hygiene, mask and sheet. Skin prep: Chlorhexidine; local anesthetic administered A antimicrobial bonded/coated double lumen catheter was placed in the right femoral vein due to patient being a dialysis patient using the Seldinger technique.  Evaluation Blood flow good Complications: No apparent complications Patient did tolerate procedure well. Chest X-ray ordered to verify placement.  CXR: not needed.  [name] 01/18/2011, 11:23 AM

## 2011-01-18 NOTE — Progress Notes (Signed)
TB skin test placed at 1200 in LEFT forearm. Area marked with marker.

## 2011-01-18 NOTE — Progress Notes (Signed)
NAMEMarland Kitchen  [name], [name]                ACCOUNT NO.:  [id number]  MEDICAL RECORD NO.:  [id number]  LOCATION:  [facility]                         FACILITY:  [facility]  PHYSICIAN:  Kaysin Brock G. Renard Matter, MD   DATE OF BIRTH:  06-Nov-1945  DATE OF PROCEDURE: DATE OF DISCHARGE:                                PROGRESS NOTE   This patient is more alert today.  She was admitted to hospital with following problems:  Hypertension, congestive heart failure, markedly elevated BNP with mild pleural effusion, GI bleed with anemia, azotemia, poorly controlled diabetes, insulin dependent, and ventral hernia.  OBJECTIVE:  VITAL SIGNS:  Blood pressure 126/78, pulse rate 67. LUNGS:  Diminished breath sounds. HEART:  Regular rhythm. ABDOMEN:  The patient had a distended lower abdomen and umbilical hernia. EXTREMITIES:  Show evidence of bilateral edema and stasis changes.  ASSESSMENT:  The patient was admitted with hypertension, congestive heart failure, markedly elevated BNP, pleural effusion, gastrointestinal bleed, and anemia, azotemia, poorly controlled diabetes and ventral hernia.  The patient was seen by GI Service Dr. Karilyn Cota.  She has had no further bleeding.  She did receive 2 units of packed RBCs and hemoglobin/hematocrit will be checked and additional blood work will be given if needed.  GI Service has postponed any intervention until she is more stable.  She was seen in consultation by Cardiology as well.  A 2-D echo has been planned to consider whether or not this is systolic or diastolic heart failure.  She is to remain on high dosages of Lasix. The patient also was seen by Nephrology, Dr. Kristian Covey who is monitoring her urinary output.  We did have renal ultrasound done which shows bilateral increased renal cortical echogenicity suggesting medical renal disease.  No hydronephrosis.  The patient's BMET is monitored as well and she might have to have dialysis.     Rivers Hamrick G. Renard Matter, MD     AGM/MEDQ   D:  01/18/2011  T:  01/18/2011  Job:  [id number]

## 2011-01-18 NOTE — Progress Notes (Addendum)
Subjective: Since I last evaluated the patient she seems to be more SOB. She is unable to lie flat in bed.  Her creatinine is climbing.  Increase edema lower extremities above the knee.  Today her H and H 9.2 and 26.0.    Objective: Vital signs in last 24 hours: Temp:  [97.4 F (36.3 C)-98.1 F (36.7 C)] 97.8 F (36.6 C) (09/19 0800) Pulse Rate:  [57-72] 72  (09/19 0800) Resp:  [16-24] 24  (09/19 0800) BP: (54-152)/(11-78) 152/74 mmHg (09/19 0800) SpO2:  [86 %-100 %] 99 % (09/19 0800) Weight:  [185 lb 3 oz (84 kg)-191 lb 2.2 oz (86.7 kg)] 191 lb 2.2 oz (86.7 kg) (09/19 0500) Last BM Date: 01/17/11  Intake/Output from previous day: 09/18 0701 - 09/19 0700 In: 3501.3 [P.O.:720; I.V.:2731.3; IV Piggyback:50] Out: 802 [Urine:800; Stool:2] Intake/Output this shift:   Physical Exam Alert and oriented. Skin warm and dry. Oral mucosa is moist. Natural teeth in good condition. Sclera anicteric, conjunctivae is pink. Thyroid not enlarged. No cervical lymphadenopathy. Bilateral expiratory wheezes.Respiratory rate up to 22 at rest.  Heart regular rate and rhythm.  Abdomen is soft.  Appears more distended today. Umbilical hernia noted.. Bowel sounds are positive. No tenderness.  2-3+ edema to  lower extremities. Patient is alert and oriented. Foley draining clear urine about 220 cc.   Lab Results:  Basename 01/18/11 0650 01/17/11 0942 01/17/11 0219 01/16/11 1221  WBC 8.2 7.2 -- 7.7  HGB 9.2* 8.5* 7.6* --  HCT 26.0* 24.8* 22.0* --  PLT 226 241 -- 299   BMET  Basename 01/18/11 0650 01/17/11 0942 01/16/11 1221  NA 138 140 142  K 4.5 4.7 4.9  CL 107 112 109  CO2 10* 9* 9*  GLUCOSE 129* 130* 95  BUN 96* 96* 97*  CREATININE 9.19* 8.21* 8.22*  CALCIUM 5.0* 5.1* 5.4*   LFT  Basename 01/17/11 0942  PROT 5.6*  ALBUMIN 2.7*  AST 12  ALT 13  ALKPHOS 99  BILITOT 0.6  BILIDIR --  IBILI --   PT/INR No results found for this basename: LABPROT:2,INR:2 in the last 72 hours Hepatitis  Panel No results found for this basename: HEPBSAG,HCVAB,HEPAIGM,HEPBIGM in the last 72 hours C-Diff No results found for this basename: CDIFFTOX:3 in the last 72 hours Fecal Lactopherrin No results found for this basename: FECLLACTOFRN in the last 72 hours  Studies/Results: US Renal  01/17/2011  *RADIOLOGY REPORT*  Clinical Data: Renal failure, hypertension, diabetes, hypercholesterolemia  RENAL/URINARY TRACT ULTRASOUND COMPLETE  Comparison:  Portable exam 1428 hours compared to11/25/2008  Findings:  Right Kidney:  11.3 cm length.  Mild cortical thinning.  Increased cortical echogenicity.  No mass, hydronephrosis shadowing calcification.  No perinephric fluid.  Left Kidney:  10.5 cm length.  Minimal cortical thinning. Increased cortical echogenicity.  No mass, hydronephrosis or shadowing calcification.  Bladder:  Decompressed by Foley catheter.  Incidental findings:  Moderate ascites.  IMPRESSION: Bilateral increased renal cortical echogenicity suggesting medical renal disease. No evidence of hydronephrosis. Moderate ascites.  Original Report Authenticated By: Lollie Marrow, M.D.   Dg Chest Portable 1 View  01/16/2011  *RADIOLOGY REPORT*  Clinical Data: Shortness of breath.  Wheezing.  PORTABLE CHEST - 1 VIEW  Comparison: Radiographs dated 03/22/2007 and 07/24/2003 and a CT scan of the abdomen dated 04/12/2007  Findings: Mild cardiomegaly with slight pulmonary vascular prominence and slight haziness at the lung bases suggesting mild edema.  Probable small bilateral pleural effusions.  Stable 12 mm nodule at the left lung  base, described on the prior CT dated 04/12/2007.  IMPRESSION: Probable mild heart failure.  Mild edema with small effusions. Stable pulmonary nodule.  Original Report Authenticated By: Gwynn Burly, M.D.    Medications: I have reviewed the patient's current medications.  Assessment/Plan:  Anemia, Guaiac positive stools.  Will continue to monitor patient.    LOS: 2 days    SETZER,TERRI W 01/18/2011, 8:41 AM  Patient states her breathing has improved since she was hemodialyzed. I have reviewed her condition with her sister Ms. Tera Perdue. Ms. Vedia Pereyra  tells me that patient was not filling her medications and missed appointments with Dr. Megan Mans. There is no family history of colon carcinoma . Patient has never been treated for peptic ulcer disease and neither has she been screened for CRC. As long as patient does not have overt GI bleed, but there is no need to rush with GI evaluation for her heme positive stools and anemia. EGD and colonoscopy would be delayed until she is deemed to be stable. We will continue to monitor her H&H.

## 2011-01-18 NOTE — Plan of Care (Signed)
Began CHF teaching, using Heart Failure Booklet.  Patient uses salt a lot at home.  Watched CHF video #102.  Also discussed knowing how you feel today and she stated she felt in the "yellow".  Will need reinforcement - Daily.

## 2011-01-18 NOTE — Consult Note (Signed)
SUBJECTIVE: Mild shortness of breath.   Filed Vitals:   01/18/11 0400 01/18/11 0500 01/18/11 0600 01/18/11 0700  BP: 116/68 120/64 126/78 138/73  Pulse: 58 57 58 64  Temp: 97.4 F (36.3 C)     TempSrc: Oral     Resp: 17 17 16 22   Height:      Weight:  191 lb 2.2 oz (86.7 kg)    SpO2: 97% 97% 97% 86%    Intake/Output Summary (Last 24 hours) at 01/18/11 0803 Last data filed at 01/18/11 0600  Gross per 24 hour  Intake 3501.25 ml  Output    801 ml  Net 2700.25 ml    LABS: Basic Metabolic Panel:  Basename 01/18/11 0650 01/17/11 2353 01/17/11 0944 01/17/11 0942  NA 138 -- -- 140  K 4.5 -- -- 4.7  CL 107 -- -- 112  CO2 10* -- -- 9*  GLUCOSE 129* -- -- 130*  BUN 96* -- -- 96*  CREATININE 9.19* -- -- 8.21*  CALCIUM 5.0* -- -- 5.1*  MG -- -- -- --  PHOS -- 10.3* 10.7* --   Liver Function Tests:  Basename 01/17/11 0942  AST 12  ALT 13  ALKPHOS 99  BILITOT 0.6  PROT 5.6*  ALBUMIN 2.7*   No results found for this basename: LIPASE:2,AMYLASE:2 in the last 72 hours CBC:  Basename 01/18/11 0650 01/17/11 0942 01/16/11 1221  WBC 8.2 7.2 --  NEUTROABS 6.1 -- 6.4  HGB 9.2* 8.5* --  HCT 26.0* 24.8* --  MCV 86.4 87.6 --  PLT 226 241 --   Cardiac Enzymes: No results found for this basename: CKTOTAL:3,CKMB:3,CKMBINDEX:3,TROPONINI:3 in the last 72 hours BNP:  Basename 01/18/11 0650 01/16/11 1221  POCBNP >35000.0* >70000.0*   D-Dimer: No results found for this basename: DDIMER:2 in the last 72 hours Hemoglobin A1C: No results found for this basename: HGBA1C in the last 72 hours Fasting Lipid Panel: No results found for this basename: CHOL,HDL,LDLCALC,TRIG,CHOLHDL,LDLDIRECT in the last 72 hours Thyroid Function Tests: No results found for this basename: TSH,T4TOTAL,FREET3,T3FREE,THYROIDAB in the last 72 hours Anemia Panel:  Basename 01/17/11 0944  VITAMINB12 --  FOLATE --  FERRITIN 144  TIBC NOT CALC  IRON 165*  RETICCTPCT --    RADIOLOGY: US  Renal  01/17/2011  *RADIOLOGY REPORT*  Clinical Data: Renal failure, hypertension, diabetes, hypercholesterolemia  RENAL/URINARY TRACT ULTRASOUND  IMPRESSION: Bilateral increased renal cortical echogenicity suggesting medical renal disease. No evidence of hydronephrosis. Moderate ascites.  Original Report Authenticated By: Lollie Marrow, M.D.   Dg Chest Portable 1 View  01/16/2011  *RADIOLOGY REPORT*  Clinical Data: Shortness of breath.  Wheezing.  PORTABLE CHEST - 1 VIEW IMPRESSION: Probable mild heart failure.  Mild edema with small effusions. Stable pulmonary nodule.  Original Report Authenticated By: Gwynn Burly, M.D.   ECHO:Left ventricle: The cavity size was normal. Wall thickness was increased in a pattern of mild LVH. There was mild focal basal hypertrophy of the septum. Systolic function was severely reduced. The estimated ejection fraction was in the range of 20% to 25%. Diffuse global hypokinesis. There is akinesis of the apical myocardium. There is severe hypokinesis of the anteroseptal myocardium. Features are consistent with a pseudonormal left ventricular filling pattern, with concomitant abnormal relaxation and increased filling pressure (grade 2 diastolic dysfunction).    PHYSICAL EXAM General: Well developed, well nourished, in no acute distress Head: Eyes PERRLA, No xanthomas.   Normal cephalic and atramatic  Lungs: Diminished bibasilar with inspiratory wheezes. Heart: HRRR S1 S2,.  Pulses are 2+ & equal.            No carotid bruit. No JVD.  No abdominal bruits. No femoral bruits. Abdomen: Bowel sounds are positive, abdomen soft and non-tender without masses or                  Hernia's noted. Msk:  Back normal, normal gait. Normal strength and tone for age. Extremities: Positive for clubbing, negative for  cyanosis Positive for 1+-2+ edema.  DP +1 Neuro: Alert and oriented X 3. Psych:  Good affect, responds appropriately  TELEMETRY: Reviewed telemetry pt in  NSR rate of 71 bpm  ASSESSMENT AND PLAN:  Active Problems:  Diabetes mellitus due to underlying condition with hyperglycemia  azotemia  anemia  gi bleeding  chf   1. Acute CHF:  Echocardiogram is pending. She remains fluid overloaded and dyspnec. She continues on high dose lasix,but is positive 2699 cc.  Creatinine is rising.  Dr. Fausto Skillern is planning dialysis,. BP is high normal. She is not a candidate for ACE inhibitors with renal failure.  Will hold off on antihypertensives at this time ntil dialysis is underway.  2. Acute Renal Failure: Creatinine is rising to 9.19. I/O is 2699 positive.  Dr. Fausto Skillern has her on 200 mg IV BID.  He has discussed dialysis with the patient.  Will follow Bettey Mare. Carrson Lightcap NP [facility]

## 2011-01-18 NOTE — Progress Notes (Signed)
Pt is new to hemodialysis. No op poc.  Pt ed. What to expect on the first day of dialysis provided. Discussed fluid restrictions with pt. And early warning s/s of low b/p while on dialysis.

## 2011-01-18 NOTE — Progress Notes (Signed)
Subjective: Interval History: has no complaint of nausea or vomiting . She denies also her any shortness of breath. Her appetite however her his nose a good .  Objective: Vital signs in last 24 hours: Temp:  [97.4 F (36.3 C)-98.1 F (36.7 C)] 97.8 F (36.6 C) (09/19 0800) Pulse Rate:  [57-72] 72  (09/19 0800) Resp:  [16-24] 24  (09/19 0800) BP: (54-152)/(11-78) 152/74 mmHg (09/19 0800) SpO2:  [86 %-100 %] 99 % (09/19 0800) Weight:  [84 kg (185 lb 3 oz)-86.7 kg (191 lb 2.2 oz)] 191 lb 2.2 oz (86.7 kg) (09/19 0500) Weight change: 3.6 kg (7 lb 15 oz)  Intake/Output from previous day: 09/18 0701 - 09/19 0700 In: 3501.3 [P.O.:720; I.V.:2731.3; IV Piggyback:50] Out: 802 [Urine:800; Stool:2] Intake/Output this shift:    General appearance: alert Resp: diminished breath sounds bibasilar Cardio: regular rate and rhythm, S1, S2 normal, no murmur, click, rub or gallop GI: Nontender her however her patient has anterior abdominal hernia. Extremities: extremities normal, atraumatic, no cyanosis or edema  Lab Results:  [facility] 01/18/11 0650 01/17/11 0942  WBC 8.2 7.2  HGB 9.2* 8.5*  HCT 26.0* 24.8*  PLT 226 241   BMET:  Basename 01/18/11 0650 01/17/11 0942  NA 138 140  K 4.5 4.7  CL 107 112  CO2 10* 9*  GLUCOSE 129* 130*  BUN 96* 96*  CREATININE 9.19* 8.21*  CALCIUM 5.0* 5.1*   No results found for this basename: PTH:2 in the last 72 hours Iron Studies:  Basename 01/17/11 0944  IRON 165*  TIBC NOT CALC  TRANSFERRIN 116*  FERRITIN 144    Studies/Results: US Renal  01/17/2011  *RADIOLOGY REPORT*  Clinical Data: Renal failure, hypertension, diabetes, hypercholesterolemia  RENAL/URINARY TRACT ULTRASOUND COMPLETE  Comparison:  Portable exam 1428 hours compared to11/25/2008  Findings:  Right Kidney:  11.3 cm length.  Mild cortical thinning.  Increased cortical echogenicity.  No mass, hydronephrosis shadowing calcification.  No perinephric fluid.  Left Kidney:  10.5 cm length.   Minimal cortical thinning. Increased cortical echogenicity.  No mass, hydronephrosis or shadowing calcification.  Bladder:  Decompressed by Foley catheter.  Incidental findings:  Moderate ascites.  IMPRESSION: Bilateral increased renal cortical echogenicity suggesting medical renal disease. No evidence of hydronephrosis. Moderate ascites.  Original Report Authenticated By: Lollie Marrow, M.D.   Dg Chest Portable 1 View  01/16/2011  *RADIOLOGY REPORT*  Clinical Data: Shortness of breath.  Wheezing.  PORTABLE CHEST - 1 VIEW  Comparison: Radiographs dated 03/22/2007 and 07/24/2003 and a CT scan of the abdomen dated 04/12/2007  Findings: Mild cardiomegaly with slight pulmonary vascular prominence and slight haziness at the lung bases suggesting mild edema.  Probable small bilateral pleural effusions.  Stable 12 mm nodule at the left lung base, described on the prior CT dated 04/12/2007.  IMPRESSION: Probable mild heart failure.  Mild edema with small effusions. Stable pulmonary nodule.  Original Report Authenticated By: Gwynn Burly, M.D.    I have reviewed the patient'Foster current medications.  Assessment/Plan: Problem #1 renal failure at this moment seems to be acute on chronic. Again the creatinine seems to be increasing and patient is her oliguric. Since her to have a blood work to compare whth or sure what her baseline creatinine is. Patient denies any nausea vomiting but her  appetite is not that great.  Her problem #2 and metabolic acidosis her she is on sodium bicarbonate CO2 is 10 Probllem #3 hyperphosphatemia as this is secondary to her chronic renal failure she  is on a binder. Problem #4 history of anemia possibly combination of her iron deficiency and anemia of  chronic renal failure patient Hemoccult positive. And the creatinine seems to be a somewhat low. Problem number her her 5 history of hypertension blood pressure seems to be controlled very well Problem #6 history of diabetes she'Foster on  insulin Problem #7 history of  hypocalcemia Problem #8 possible history of  CHF chest x-ray with cardiomegaly and possible CHF Her recommendation I will ask surgery to put hemodialysis catheter and  we'll consider starting her on dialysis . Have discussed with the patient and she seems to be agreeable. Once the patient is stable we'll send her for permacath placement. We'll DC her  IV fluid but I will continue his diuretics. Will check hepatitis B surface antigen hepatitis B antibody and hepatitis B core antibody. Would also put PPD today. And I will do intact PTH.  [name] 01/18/2011,8:07 AM

## 2011-01-19 ENCOUNTER — Inpatient Hospital Stay (HOSPITAL_COMMUNITY): Payer: Medicare Other

## 2011-01-19 ENCOUNTER — Encounter (HOSPITAL_COMMUNITY)
Admission: EM | Disposition: A | Discharge status: Home / Self Care | Payer: Self-pay | Source: Home / Self Care | Attending: Family Medicine

## 2011-01-19 HISTORY — PX: ESOPHAGOGASTRODUODENOSCOPY: SHX5428

## 2011-01-19 LAB — BASIC METABOLIC PANEL
BUN: 75 mg/dL — ABNORMAL HIGH (ref 6–23)
Calcium: 5.1 mg/dL — CL (ref 8.4–10.5)
Creatinine, Ser: 6.76 mg/dL — ABNORMAL HIGH (ref 0.50–1.10)
GFR calc Af Amer: 7 mL/min — ABNORMAL LOW (ref >60)
GFR calc non Af Amer: 6 mL/min — ABNORMAL LOW (ref >60)

## 2011-01-19 LAB — GLUCOSE, CAPILLARY
Glucose-Capillary: 93 mg/dL (ref 70–99)
Glucose-Capillary: 109 mg/dL — ABNORMAL HIGH (ref 70–99)

## 2011-01-19 LAB — CBC
MCHC: 34.9 g/dL (ref 30.0–36.0)
RDW: 15.6 % — ABNORMAL HIGH (ref 11.5–15.5)

## 2011-01-19 LAB — APTT: aPTT: 33 seconds (ref 24–37)

## 2011-01-19 LAB — PHOSPHORUS: Phosphorus: 7.7 mg/dL — ABNORMAL HIGH (ref 2.3–4.6)

## 2011-01-19 LAB — PROTIME-INR
INR: 1.19 (ref 0.00–1.49)
Prothrombin Time: 15.4 seconds — ABNORMAL HIGH (ref 11.6–15.2)

## 2011-01-19 LAB — HEPATITIS B SURFACE ANTIGEN: Hepatitis B Surface Ag: NEGATIVE

## 2011-01-19 LAB — HEPATITIS B SURFACE ANTIBODY,QUALITATIVE: Hep B S Ab: NEGATIVE

## 2011-01-19 SURGERY — EGD (ESOPHAGOGASTRODUODENOSCOPY)
Anesthesia: Moderate Sedation

## 2011-01-19 MED ORDER — MEPERIDINE HCL 25 MG/ML IJ SOLN
INTRAMUSCULAR | Status: DC | PRN
  Administered 2011-01-19: 25 mg via INTRAVENOUS

## 2011-01-19 MED ORDER — MEPERIDINE HCL 50 MG/ML IJ SOLN
INTRAMUSCULAR | Status: AC
  Filled 2011-01-19: qty 1

## 2011-01-19 MED ORDER — CALCIUM CARBONATE 1250 MG/5ML PO SUSP
1000.0000 mg | Freq: Every day | ORAL | Status: DC
  Administered 2011-01-19 – 2011-01-20 (×2): 1000 mg
  Administered 2011-01-21: 100 mg
  Filled 2011-01-19 (×4): qty 10

## 2011-01-19 MED ORDER — MIDAZOLAM HCL 5 MG/5ML IJ SOLN
INTRAMUSCULAR | Status: DC | PRN
  Administered 2011-01-19: 1 mg via INTRAVENOUS
  Administered 2011-01-19: 2 mg via INTRAVENOUS

## 2011-01-19 MED ORDER — SODIUM CHLORIDE 0.9 % IJ SOLN
INTRAMUSCULAR | Status: AC
  Administered 2011-01-19: 22:00:00
  Filled 2011-01-19: qty 3

## 2011-01-19 MED ORDER — SODIUM CHLORIDE 0.9 % IV SOLN: 100.0000 mL | INTRAVENOUS | Status: DC | PRN

## 2011-01-19 MED ORDER — ONDANSETRON HCL 4 MG/2ML IJ SOLN
4.0000 mg | INTRAMUSCULAR | Status: DC | PRN
  Administered 2011-01-19 – 2011-01-20 (×2): 4 mg via INTRAVENOUS
  Filled 2011-01-19 (×2): qty 2

## 2011-01-19 MED ORDER — STERILE WATER FOR IRRIGATION IR SOLN
Status: DC | PRN
  Administered 2011-01-19: 18:00:00

## 2011-01-19 MED ORDER — HYDRALAZINE HCL 10 MG PO TABS
20.0000 mg | ORAL_TABLET | Freq: Three times a day (TID) | ORAL | Status: DC
  Administered 2011-01-19 – 2011-01-20 (×3): 20 mg via ORAL
  Filled 2011-01-19 (×14): qty 2

## 2011-01-19 MED ORDER — SODIUM CHLORIDE 0.9 % IJ SOLN
INTRAMUSCULAR | Status: AC
  Filled 2011-01-19: qty 20

## 2011-01-19 MED ORDER — ONDANSETRON HCL 4 MG/2ML IJ SOLN
INTRAMUSCULAR | Status: AC
  Administered 2011-01-19: 4 mg via INTRAVENOUS
  Filled 2011-01-19: qty 2

## 2011-01-19 MED ORDER — MIDAZOLAM HCL 5 MG/5ML IJ SOLN
INTRAMUSCULAR | Status: AC
  Filled 2011-01-19: qty 10

## 2011-01-19 MED ORDER — CIPROFLOXACIN IN D5W 200 MG/100ML IV SOLN
200.0000 mg | Freq: Two times a day (BID) | INTRAVENOUS | Status: DC
  Administered 2011-01-19 – 2011-01-21 (×4): 200 mg via INTRAVENOUS
  Filled 2011-01-19 (×8): qty 100

## 2011-01-19 MED ORDER — CALCIUM CARBONATE 1250 MG/5ML PO SUSP
2000.0000 mg | Freq: Three times a day (TID) | ORAL | Status: DC
  Administered 2011-01-19 – 2011-01-20 (×4): 2000 mg
  Administered 2011-01-21 (×2): 200 mg
  Filled 2011-01-19 (×13): qty 20

## 2011-01-19 MED ORDER — SODIUM CHLORIDE 0.45 % IV SOLN
INTRAVENOUS | Status: DC
Start: 2011-01-19 — End: 2011-01-19

## 2011-01-19 MED ORDER — BUTAMBEN-TETRACAINE-BENZOCAINE 2-2-14 % EX AERO
INHALATION_SPRAY | CUTANEOUS | Status: DC | PRN
  Administered 2011-01-19: 1 via TOPICAL

## 2011-01-19 NOTE — Op Note (Signed)
EGD PROCEDURE REPORT  PATIENT:  [name]  MR#:  [id number] Birthdate:  30-May-1945, 65 y.o., female Endoscopist:  Dr. Malissa Hippo, MD Referred By:  Dr. Lanny Cramp, MD Procedure Date: 01/19/2011  Procedure:   EGD  Indications:  Acute onset of vomiting in a patient with a couple co-morbidities who also has anemia and heme positive stools. She is undergoing diagnostic esophagogastroduodenoscopy to           Informed Consent: Procedure and risks reviewed with the patient and informed consent was obtained. I also discussed the procedure with her sister [name] Medications:  Demerol 25 mg IV Versed 3 mg IV Cetacaine spray topically for oropharyngeal anesthesia  Description of procedure:  The endoscope was introduced through the mouth and advanced to the second portion of the duodenum without difficulty or limitations. The mucosal surfaces were surveyed very carefully during advancement of the scope and upon withdrawal.  Findings:  Esophagus:  Normal esophageal mucosa. Single grade 1 column of esophageal varix. Single erosion at GE junction GEJ:  38 cm Hiatus:  40 cm Stomach:  No food debris noted in the stomach. There was some powdery material felt to be dissolved pill. Mucosa at body antrum pyloric channel as well as and a Veress fundus and cardia was normal. Duodenum:  In the bulb there was patchy edema and erythema with no evidence of ulcer. Folds and mucosa in the second part of duodenum were normal.  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 Small sliding-type hernia with single erosion at GE junction. Bulbar duodenitis. No evidence of pyloric stenosis or active peptic ulcer disease.  Recommendations:  Continue pantoprazole. Full liquid diet H. pylori serology. She will also need hepatobiliary ultrasound when that she has single esophageal wakes ascites on ultrasound that was  done to examine her kidneys.   Mccade Sullenberger U  01/19/2011  5:51 PM  CC: Dr. Alice Reichert, MD & Dr. Bonnetta Barry ref. provider found

## 2011-01-19 NOTE — Progress Notes (Signed)
zofran 4mg  iv given for nausea with small amt of emesis after being pulled up in bed, no c/o pain

## 2011-01-19 NOTE — Progress Notes (Signed)
Patient received pneumococcal vaccine 01/17/11 at 1000 Lot #[id number], exp. 05/01/12. Also received TB on 01/18/11 at 0830.

## 2011-01-19 NOTE — Progress Notes (Signed)
carelink arrangements made for tomorrow morning. Pt will be picked up at approx. 0900 and transported to [facility].

## 2011-01-19 NOTE — Progress Notes (Signed)
Subjective: Interval History: has no complaint of nausea or vomiting. Patient denies also any shortness of breath orthopnea or paroxysmal nocturnal dyspnea..  Objective: Vital signs in last 24 hours: Temp:  [97.6 F (36.4 C)-98.8 F (37.1 C)] 98.8 F (37.1 C) (09/20 0400) Pulse Rate:  [65-77] 68  (09/20 0600) Resp:  [16-25] 16  (09/20 0600) BP: (104-160)/(50-91) 124/64 mmHg (09/20 0620) SpO2:  [95 %-100 %] 95 % (09/20 0600) Weight:  [83.2 kg (183 lb 6.8 oz)-85.2 kg (187 lb 13.3 oz)] 183 lb 6.8 oz (83.2 kg) (09/19 1630) Weight change: 1.2 kg (2 lb 10.3 oz)  Intake/Output from previous day: 09/19 0701 - 09/20 0700 In: 600 [P.O.:490; I.V.:10; IV Piggyback:100] Out: 1952 [Urine:950; Stool:2] Intake/Output this shift:    General appearance: alert Resp: clear to auscultation bilaterally Cardio: regular rate and rhythm, S1, S2 normal, no murmur, click, rub or gallop GI: soft, non-tender; bowel sounds normal; no masses,  no organomegaly Extremities: edema 1-2+ edema.  Lab Results:  Basename 01/19/11 0520 01/18/11 0650  WBC 6.9 8.2  HGB 8.1* 9.2*  HCT 23.2* 26.0*  PLT 194 226   BMET:  Basename 01/19/11 0520 01/18/11 0650  NA 136 138  K 3.9 4.5  CL 105 107  CO2 15* 10*  GLUCOSE 97 129*  BUN 75* 96*  CREATININE 6.76* 9.19*  CALCIUM 5.1* 5.0*   No results found for this basename: PTH:2 in the last 72 hours Iron Studies:  Basename 01/17/11 0944  IRON 165*  TIBC NOT CALC  TRANSFERRIN 116*  FERRITIN 144    Studies/Results: US Renal  01/17/2011  *RADIOLOGY REPORT*  Clinical Data: Renal failure, hypertension, diabetes, hypercholesterolemia  RENAL/URINARY TRACT ULTRASOUND COMPLETE  Comparison:  Portable exam 1428 hours compared to11/25/2008  Findings:  Right Kidney:  11.3 cm length.  Mild cortical thinning.  Increased cortical echogenicity.  No mass, hydronephrosis shadowing calcification.  No perinephric fluid.  Left Kidney:  10.5 cm length.  Minimal cortical thinning.  Increased cortical echogenicity.  No mass, hydronephrosis or shadowing calcification.  Bladder:  Decompressed by Foley catheter.  Incidental findings:  Moderate ascites.  IMPRESSION: Bilateral increased renal cortical echogenicity suggesting medical renal disease. No evidence of hydronephrosis. Moderate ascites.  Original Report Authenticated By: Lollie Marrow, M.D.    I have reviewed the patient's current medications.  Assessment/Plan: Problem #1 renal failure acute on chronic pending creatinine at this moment isn't-6.67 has improved she is status post hemodialysis yesterday. Her potassium is 3.9. Problem #2 metabolic acidosis her CO2 has also come down. Problem #3 history of anemia and compression of iron deficiency and anemia of chronic disease her H&H has seems to be slightly low. Problem #4 history of her anasarca and CHF patient still with some edema however her she denies any shortness of breath orthopnea or paroxysmal nocturnal dyspnea. Problem #5 history of hypertension blood pressure seems to be controlled very well Problem #6 history of diabetes she's on insulin Problem #7 history of her hyperphosphatemia related to her her renal failure she is on calcium binder. Problem #8 history of hypercalcemia calcium slightly better Recommendation we'll make arrangements for patient to get dialysis today we'll use a 3K 3.5 calcium as We'll try to remove about 3 L We'll continue his Epogen I will send her the patient radiology for Ditek placement placement tomorrow.    LOS: 3 days   [name] S 01/19/2011,7:23 AM
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
## 2011-01-19 NOTE — Consult Note (Signed)
SUBJECTIVE: No complaints today.  Currently undergoing dialysis.   Filed Vitals:   01/19/11 0500 01/19/11 0600 01/19/11 0620 01/19/11 0800  BP:  124/64 124/64   Pulse: 72 68    Temp:    98.7 F (37.1 C)  TempSrc:    Oral  Resp: 18 16    Height:      Weight:      SpO2: 96% 95%      Intake/Output Summary (Last 24 hours) at 01/19/11 0829 Last data filed at 01/19/11 0500  Gross per 24 hour  Intake    430 ml  Output   1952 ml  Net  -1522 ml    LABS: Basic Metabolic Panel:  Basename 01/19/11 0520 01/18/11 0650 01/17/11 2353  NA 136 138 --  K 3.9 4.5 --  CL 105 107 --  CO2 15* 10* --  GLUCOSE 97 129* --  BUN 75* 96* --  CREATININE 6.76* 9.19* --  CALCIUM 5.1* 5.0* --  MG -- -- --  PHOS 7.7* -- 10.3*   Liver Function Tests:  Basename 01/18/11 0700 01/17/11 0942  AST -- 12  ALT 13 13  ALKPHOS -- 99  BILITOT -- 0.6  PROT -- 5.6*  ALBUMIN -- 2.7*   No results found for this basename: LIPASE:2,AMYLASE:2 in the last 72 hours CBC:  Basename 01/19/11 0520 01/18/11 0650 01/16/11 1221  WBC 6.9 8.2 --  NEUTROABS -- 6.1 6.4  HGB 8.1* 9.2* --  HCT 23.2* 26.0* --  MCV 85.9 86.4 --  PLT 194 226 --     Basename 01/18/11 0650 01/16/11 1221  POCBNP 70000.0* >70000.0*   Anemia Panel:  Basename 01/17/11 0944  VITAMINB12 --  FOLATE --  FERRITIN 144  TIBC NOT CALC  IRON 165*  RETICCTPCT --    RADIOLOGY: US Renal  01/17/2011  *RADIOLOGY REPORT*  Clinical Data: Renal failure, hypertension, diabetes, hypercholesterolemia  RENAL/URINARY TRACT ULTRASOUND COMPLETE   IMPRESSION: Bilateral increased renal cortical echogenicity suggesting medical renal disease. No evidence of hydronephrosis. Moderate ascites.  Original Report Authenticated By: Lollie Marrow, M.D.   Dg Chest Portable 1 View  01/16/2011  *RADIOLOGY REPORT*  Clinical Data: Shortness of breath.  Wheezing.  PORTABLE CHEST - 1 VIEW    IMPRESSION: Probable mild heart failure.  Mild edema with small effusions.  Stable pulmonary nodule.  Original Report Authenticated By: Gwynn Burly, M.D.   ECHO: Left ventricle: The cavity size was normal. Wall thickness was increased in a pattern of mild LVH. There was mild focal basal hypertrophy of the septum. Systolic function was severely reduced. The estimated ejection fraction was in the range of 20% to 25%. Diffuse global hypokinesis. There is akinesis of the apical myocardium. There is severe hypokinesis of the anteroseptal myocardium. Features are consistent with a pseudonormal left ventricular filling pattern, with concomitant abnormal relaxation and increased filling pressure (grade 2 diastolic dysfunction). - Aortic valve: Mildly calcified annulus. Trileaflet; moderately calcified leaflets. Cusp separation was reduced. There was mild stenosis. No significant regurgitation. Mean gradient: 8mm Hg (S). - Mitral valve: Calcified annulus. Mild regurgitation. - Left atrium: The atrium was moderately dilated. - Right atrium: The atrium was mildly dilated. - Tricuspid valve: Moderate regurgitation. - Pulmonic valve: Mild regurgitation. - Pulmonary arteries: Systolic pressure was moderately increased. PA peak pressure: 56mm Hg (S). - Inferior vena cava: The vessel was dilated; the respirophasic diameter changes were blunted (< 50%); findings are consistent with elevated central venous pressure.   PHYSICAL EXAM General: Well developed,  well nourished, in no acute distress Head: Eyes PERRLA, No xanthomas.   Normal cephalic and atramatic  Lungs: CTA in upper lobes, but diminished in the bases. Heart: HRRR S1 S2, rate is better.  Pulses are 2+ & equal.            No carotid bruit. No JVD.  No abdominal bruits. No femoral bruits. Abdomen: Bowel sounds are positive, abdomen soft and non-tender without masses or                  Hernia's noted. Msk:  Back normal, normal gait. Normal strength and tone for age. Extremities: No clubbing, cyanosis or  edema.  DP +1 Neuro: Alert and oriented X 3. 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 Acute CHF: Echocardiogram demonstrates systolic dysfx.  EF of 20-25%. Grade II diastolic dysfx. 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  She is improving in her urine output via dialysis with generalized improvement overall. She remains on hydralazine and BP is stable.   2. Acute Renal Failure: She is undergoing dialysis, with first treatment 01/18/11.  Creatinine improved from 9.19 to 6.75.  She is found to anemic with decreased Hgb to 8.1 and is having blood transfusion during dialysis.  Will wait for further cardiac medication adjustments once renal fx improves further or returns to baseline.  Ongoing dialysis long term per Dr. Fausto Skillern if he feels this is needed.  Bettey Mare. Lanora Reveron NP [facility]

## 2011-01-19 NOTE — Progress Notes (Signed)
MD notified that pt is not receiving PO meds because of emesis after swallowing pill that is not relieved by zophran. MD ordered to changed PO cipro to IV. Pt has no come [place] of Nausea or pain at this time.

## 2011-01-19 NOTE — Progress Notes (Signed)
Subjective; patient has been vomiting since this morning; she has not been able to keep her food or medications down. No hematemesis reported; he denies heartburn or abdominal pain; patient is to have Ditek placement at [facility] in a.m. Objective; BP 143/70  Pulse 82  Temp(Src) 98.6 F (37 C) (Oral)  Resp 26  Ht 5\' 2"  (1.575 m)  Wt 178 lb 12.7 oz (81.1 kg)  BMI 32.70 kg/m2  SpO2 97% INR 1.19. Hemoglobin 10.3 and hematocrit 29.2 BUN is down to 75 and creatinine is 6.76 Calcium is 5.1 Assessment; New onset of vomiting; given for found anemia and history of heme positive stools need to rule out peptic ulcer disease.  H&H remains stable. Recommendations. Will proceed with esophagogastroduodenoscopy. Patient and her sister are agreeable.

## 2011-01-19 NOTE — Consult Note (Signed)
1. Acute (on chronic?) systolic CHF, LVEF 20-25%. Possibly mixed etiology, underlyling CAD quite possible based on risk factors. Doubt low output situation at this point.  2. Probably progressive chronic renal failure, now ESRD. Hemodialysis started yesterday. Good volume removal and tolerating from hemodynamic perspective.  3. Anemia, question component of blood loss and related to chronic disease. Hemoglobin down to 8.1 - getting PRBC's with hemodialysis.  4. History of noncompliance with medical therapy for chronic medical conditions including DM2 and hypertension - likely has end organ disease at this point.   Patient seen and examined. Discussed with Ms. Lawrence. Patient had approximately 3000 cc out with first HD session and is planned for 3.5 hour session today per Nephrology. Her rhythm and BP have been stable. When volume status more at baseline, can consider starting Coreg. No ASA until anemia etiology and course is better understood. Hydralazine for afterload reduction - may transition to ACE-I later once certain that ESRD is the case. General plan is for medical therapy for cardiomyopathy at this point without aggressive ischemic workup. We can reassess as course progresses.

## 2011-01-19 NOTE — Progress Notes (Signed)
NAMEMarland Foster  SHAYE, ELLING                ACCOUNT NO.:  [id number]  MEDICAL RECORD NO.:  [id number]  LOCATION:  [facility]                         FACILITY:  [facility]  PHYSICIAN:  Nereida Schepp G. Renard Matter, MD   DATE OF BIRTH:  [birth year]  DATE OF PROCEDURE:  01/19/2011 DATE OF DISCHARGE:                                PROGRESS NOTE   The patient appears to be more alert.  She had been treated for the following problems; hypertension, congestive heart failure, anemia, azotemia, diabetes, ventral hernia.  OBJECTIVE:  VITAL SIGNS:  Blood pressure 124/64, pulse 72. LUNGS:  Diminished breath sounds bilaterally. HEART:  Regular rhythm. ABDOMEN:  The patient has distended lower abdomen with umbilical hernia. EXTREMITIES:  Show evidence of bilateral edema and stasis changes.  ASSESSMENT:  The patient has following problems. 1. Hypertension which appears to be controlled. 2. Congestive heart failure with left ventricular ejection fraction of     20-25% and possible underlying ischemic heart disease.  The patient     did have gastrointestinal bleed which is not active now, but she     does have a hemoglobin of 8.1, hematocrit 23.2. 3. The patient is in renal failure and is receiving dialysis.  Her     most recent creatinine was 6.76 with BUN 75.  PLAN:  To continue dialysis.  We will type and cross for blood transfusion today.  Continue to monitor hemoglobin and hematocrit. Continue to attempt diuresis, she currently is on 200 mg IV Lasix b.i.d.     [name] G. Renard Matter, MD     AGM/MEDQ  D:  01/19/2011  T:  01/19/2011  Job:  [id number]

## 2011-01-19 NOTE — Progress Notes (Signed)
Pt second hd treatment.  Explained all activity to pt as putting her on.  Had no questions.  Report received from [facility].  Was reported to have avf but unable to find.  Husband states she has not had one.

## 2011-01-20 ENCOUNTER — Ambulatory Visit (HOSPITAL_COMMUNITY): Payer: Medicare Other

## 2011-01-20 DIAGNOSIS — E119 Type 2 diabetes mellitus without complications: Secondary | ICD-10-CM | POA: Insufficient documentation

## 2011-01-20 DIAGNOSIS — N189 Chronic kidney disease, unspecified: Secondary | ICD-10-CM | POA: Insufficient documentation

## 2011-01-20 DIAGNOSIS — I129 Hypertensive chronic kidney disease with stage 1 through stage 4 chronic kidney disease, or unspecified chronic kidney disease: Secondary | ICD-10-CM | POA: Insufficient documentation

## 2011-01-20 LAB — CBC
HCT: 26.6 % — ABNORMAL LOW (ref 36.0–46.0)
HCT: 39.5
Hemoglobin: 9.6 g/dL — ABNORMAL LOW (ref 12.0–15.0)
Hemoglobin: 13.7
MCH: 31.3 pg (ref 26.0–34.0)
MCHC: 36.1 g/dL — ABNORMAL HIGH (ref 30.0–36.0)
MCV: 86.6 fL (ref 78.0–100.0)
MCV: 85.5
Platelets: 420 — ABNORMAL HIGH
RDW: 15.5 % (ref 11.5–15.5)
RDW: 15.1

## 2011-01-20 LAB — TYPE AND SCREEN
Antibody Screen: NEGATIVE
Unit division: 0
Unit division: 0

## 2011-01-20 LAB — BASIC METABOLIC PANEL
BUN: 40 mg/dL — ABNORMAL HIGH (ref 6–23)
BUN: 21
CO2: 29
Calcium: 6.1 mg/dL — CL (ref 8.4–10.5)
Chloride: 101
Creatinine, Ser: 5.11 mg/dL — ABNORMAL HIGH (ref 0.50–1.10)
GFR calc Af Amer: 10 mL/min — ABNORMAL LOW (ref >60)
GFR calc non Af Amer: 8 mL/min — ABNORMAL LOW (ref >60)
GFR calc non Af Amer: >60
Glucose, Bld: 86 mg/dL (ref 70–99)
Glucose, Bld: 232 — ABNORMAL HIGH
Potassium: 4.7
Sodium: 140

## 2011-01-20 LAB — IRON AND TIBC
Iron: 39 ug/dL — ABNORMAL LOW (ref 42–135)
Saturation Ratios: 26 % (ref 20–55)
UIBC: 112 ug/dL — ABNORMAL LOW (ref 125–400)

## 2011-01-20 LAB — DIFFERENTIAL
Basophils Absolute: 0
Eosinophils Absolute: 0.1
Eosinophils Relative: 1
Lymphocytes Relative: 35
Monocytes Absolute: 0.6

## 2011-01-20 LAB — FERRITIN: Ferritin: 268 ng/mL (ref 10–291)

## 2011-01-20 LAB — GLUCOSE, CAPILLARY: Glucose-Capillary: 159 mg/dL — ABNORMAL HIGH (ref 70–99)

## 2011-01-20 LAB — RETICULOCYTES: Retic Count, Absolute: 61.4 10*3/uL (ref 19.0–186.0)

## 2011-01-20 LAB — FOLATE: Folate: 6.3 ng/mL

## 2011-01-20 MED ORDER — ISOSORBIDE MONONITRATE ER 60 MG PO TB24
60.0000 mg | ORAL_TABLET | Freq: Every day | ORAL | Status: DC
  Administered 2011-01-20 – 2011-01-23 (×3): 60 mg via ORAL
  Filled 2011-01-20 (×3): qty 1

## 2011-01-20 MED ORDER — HYDRALAZINE HCL 10 MG PO TABS
ORAL_TABLET | ORAL | Status: AC
  Filled 2011-01-20: qty 2

## 2011-01-20 MED ORDER — EPOETIN ALFA 10000 UNIT/ML IJ SOLN
10000.0000 [IU] | INTRAMUSCULAR | Status: DC
  Administered 2011-01-23: 10000 [IU] via INTRAVENOUS
  Filled 2011-01-20 (×3): qty 1

## 2011-01-20 MED ORDER — CARVEDILOL 3.125 MG PO TABS
6.2500 mg | ORAL_TABLET | Freq: Two times a day (BID) | ORAL | Status: DC
  Administered 2011-01-21 – 2011-01-23 (×4): 6.25 mg via ORAL
  Filled 2011-01-20 (×5): qty 2

## 2011-01-20 MED ORDER — HYDRALAZINE HCL 25 MG PO TABS
50.0000 mg | ORAL_TABLET | Freq: Three times a day (TID) | ORAL | Status: DC
  Administered 2011-01-20 – 2011-01-23 (×7): 50 mg via ORAL
  Filled 2011-01-20: qty 2
  Filled 2011-01-20: qty 1
  Filled 2011-01-20 (×4): qty 2
  Filled 2011-01-20 (×3): qty 1
  Filled 2011-01-20 (×2): qty 2

## 2011-01-20 MED ORDER — CARVEDILOL 3.125 MG PO TABS
3.1250 mg | ORAL_TABLET | Freq: Two times a day (BID) | ORAL | Status: AC
  Administered 2011-01-20: 3.125 mg via ORAL
  Filled 2011-01-20: qty 1

## 2011-01-20 NOTE — Progress Notes (Signed)
Miss Fischer's calcium was 6.1 & DrMcinnis notified

## 2011-01-20 NOTE — Progress Notes (Signed)
Pt to be transferred to [facility] for Diatech HD catheter placement via [facility]. Report given to [facility] RN.

## 2011-01-20 NOTE — Progress Notes (Signed)
Pt returned from [facility] via [place]. Pt had right subclavian dialysis catheter placed. Dressing clean/dry/intact

## 2011-01-20 NOTE — Progress Notes (Signed)
Pt to be transferred to room 331 per MD order. Report called to RN. Pt to be transferred via bed with belongings. Family members at bedside.

## 2011-01-20 NOTE — Progress Notes (Signed)
NAMEMarland Kitchen  Shelby, Foster                ACCOUNT NO.:  [id number]  MEDICAL RECORD NO.:  [id number]  LOCATION:  [facility]                         FACILITY:  [facility]  PHYSICIAN:  Truly Stankiewicz G. Renard Matter, MD   DATE OF BIRTH:  [birth year]  DATE OF PROCEDURE:  01/20/2011 DATE OF DISCHARGE:                                PROGRESS NOTE   This patient appears more alert today.  She had dialysis yesterday.  She is being treated for the following problems, hypertension, congestive heart failure, anemia, azotemia, diabetes, and ventral hernia.  She did have dialysis again yesterday.  OBJECTIVE:  VITAL SIGNS:  Blood pressure 138/57, respirations 16, pulse 73, temperature 98. LUNGS:  Clear to P and A. HEART:  Regular rhythm. ABDOMEN.  Somewhat distended in lower abdomen.  The patient does have ventral hernia. EXTREMITIES:  Continue to show evidence of brawny edema.  The patient's current hemoglobin is 9.6, hematocrit 26.6.  Current BUN 75, creatinine 6.76.  ASSESSMENT: 1. Hypertension which appears to be controlled. 2. Congestive heart failure with ejection fraction 24-25%, underlying     ischemic heart disease. 3. Recent gastrointestinal bleed, not active now.  Current hemoglobin     9.6, hematocrit 26.6. 4. The patient has renal failure and receiving dialysis.  PLAN:  This morning, the patient is to be transferred to [facility] for placement of Diatek catheter for continued dialysis.  Continue current regimen with reference to Lasix and other medications.  The patient's calcium goes up at 6.1     Genevra Orne G. Renard Matter, MD     AGM/MEDQ  D:  01/20/2011  T:  01/20/2011  Job:  [id number]

## 2011-01-20 NOTE — Consult Note (Signed)
SUBJECTIVE: Feeling better. No complaints. Mentation improving.   Filed Vitals:   01/20/11 0500 01/20/11 0600 01/20/11 0700 01/20/11 0800  BP:  152/73 142/70 142/70  Pulse:  80 82 80  Temp:    98.2 F (36.8 C)  TempSrc:    Oral  Resp:  16 18 15   Height:      Weight: 174 lb 13.2 oz (79.3 kg)     SpO2:  96% 95% 97%    Intake/Output Summary (Last 24 hours) at 01/20/11 0853 Last data filed at 01/20/11 0500  Gross per 24 hour  Intake   1052 ml  Output   5570 ml  Net  -4518 ml    LABS: Basic Metabolic Panel:  Basename 01/20/11 0410 01/19/11 0520  NA 137 136  K 3.4* 3.9  CL 100 105  CO2 24 15*  GLUCOSE 86 97  BUN 40* 75*  CREATININE 5.11* 6.76*  CALCIUM 6.1* 5.1*  MG -- --  PHOS 5.6* 7.7*   Liver Function Tests:  Basename 01/18/11 0700 01/17/11 0942  AST -- 12  ALT 13 13  ALKPHOS -- 99  BILITOT -- 0.6  PROT -- 5.6*  ALBUMIN -- 2.7*   No results found for this basename: LIPASE:2,AMYLASE:2 in the last 72 hours CBC:  Basename 01/20/11 0410 01/19/11 1414 01/19/11 0520 01/18/11 0650  WBC 8.7 -- 6.9 --  NEUTROABS -- -- -- 6.1  HGB 9.6* 10.3* -- --  HCT 26.6* 29.2* -- --  MCV 86.6 -- 85.9 --  PLT 181 -- 194 --     Basename 01/18/11 0650  POCBNP 70000.0*   Anemia Panel:  Basename 01/20/11 0410 01/17/11 0944  VITAMINB12 -- --  FOLATE -- --  FERRITIN -- 144  TIBC -- NOT CALC  IRON -- 165*  RETICCTPCT 2.0 --    RADIOLOGY: US Renal  01/17/2011  *RADIOLOGY REPORT*  Clinical Data: Renal failure, hypertension, diabetes, hypercholesterolemia  RENAL/URINARY TRACT ULTRASOUND COMPLETE   IMPRESSION: Bilateral increased renal cortical echogenicity suggesting medical renal disease. No evidence of hydronephrosis. Moderate ascites.  Original Report Authenticated By: Lollie Marrow, M.D.   Dg Chest Portable 1 View  01/16/2011  *RADIOLOGY REPORT*  Clinical Data: Shortness of breath.  Wheezing.  PORTABLE CHEST - 1 VIEW    IMPRESSION: Probable mild heart failure.  Mild  edema with small effusions. Stable pulmonary nodule.  Original Report Authenticated By: Gwynn Burly, M.D.   ECHO: Left ventricle: The cavity size was normal. Wall thickness was increased in a pattern of mild LVH. There was mild focal basal hypertrophy of the septum. Systolic function was severely reduced. The estimated ejection fraction was in the range of 20% to 25%. Diffuse global hypokinesis. There is akinesis of the apical myocardium. There is severe hypokinesis of the anteroseptal myocardium. Features are consistent with a pseudonormal left ventricular filling pattern, with concomitant abnormal relaxation and increased filling pressure (grade 2 diastolic dysfunction). - Aortic valve: Mildly calcified annulus. Trileaflet; moderately calcified leaflets. Cusp separation was reduced. There was mild stenosis. No significant regurgitation. Mean gradient: 8mm Hg (S). - Mitral valve: Calcified annulus. Mild regurgitation. - Left atrium: The atrium was moderately dilated. - Right atrium: The atrium was mildly dilated. - Tricuspid valve: Moderate regurgitation. - Pulmonic valve: Mild regurgitation. - Pulmonary arteries: Systolic pressure was moderately increased. PA peak pressure: 56mm Hg (S). - Inferior vena cava: The vessel was dilated; the respirophasic diameter changes were blunted (< 50%); findings are consistent with elevated central venous pressure.   PHYSICAL  EXAM General: Well developed, well nourished, in no acute distress Head: Eyes PERRLA, No xanthomas.   Normal cephalic and atramatic  Lungs: CTA in upper lobes, but diminished in the bases. Heart: HRRR S1 S2, rate is better.  Pulses are 2+ & equal.            No carotid bruit. No JVD.  No abdominal bruits. No femoral bruits. Abdomen: Bowel sounds are positive, abdomen soft and non-tender without masses or  Hernia's noted. Mild distention. ascities. Msk:  Back normal, normal gait. Normal strength and tone for  age. Extremities: Positive clubbing, cyanosis or edema.Woody skin appearance in LE.   DP +1 Dressing to Right leg. Neuro: Alert and oriented X 3. Psych:  Good affect, responds appropriately Skin: Pale TELEMETRY: Reviewed telemetry pt in NSR rate of 74 bpm  ASSESSMENT AND PLAN:   1. Acute CHF: Echocardiogram demonstrates systolic dysfx.  EF of 20-25%. Grade II diastolic dysfx. 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  Has had two days of dialysis with another 4000cc removed. Wt down another 2 lbs.(Highest wt recorded 191 lbs) Breath sounds are improving, but ascites remains.  Continue hydralazine for after load reduction.  Would like to start coreg soon, once she is more euvolemic.  Ischemic work-up can be completed once she is stable from renal standpoint.  2. Acute Renal Failure: She is undergoing dialysis, having had two treatments.   Creatinine improved from 9.19 to 5.11 since starting dialysis.  Will wait for further cardiac medication adjustments once renal fx improves further or returns to baseline.  Ongoing dialysis long term per Dr. Fausto Skillern if he feels this is needed.  [name]. Lawrence NP [facility]  Cardiology Attending  Patient interviewed and examined. Discussed with Joni Reining, NP.  Above note annotated and modified based upon my findings.  Patient has undergone 2 dialysis treatments with a net removal of 3 L of fluid and improvement in dyspnea. She remains oliguric, but is putting out some urine. She probably is at least 5-10 pounds above her dry weight. Chest x-ray 4 days ago showed mild pulmonary edema and has not yet been repeated. Weight has decreased 2 kg.  Blood pressure is adequate to allow institution of appropriate medications.   The mainstay of her therapy will be maintenance of ideal fluid status with dialysis.   Bing, MD

## 2011-01-20 NOTE — Progress Notes (Signed)
Subjective: Interval History: has no complaint of nausea or vomiting. Patient also denies any shortness of breath photopenia or paroxysmal nocturnal dyspnea..  Objective: Vital signs in last 24 hours: Temp:  [98 F (36.7 C)-98.8 F (37.1 C)] 98 F (36.7 C) (09/21 0400) Pulse Rate:  [70-88] 80  (09/21 0600) Resp:  [15-26] 16  (09/21 0600) BP: (114-159)/(56-73) 152/73 mmHg (09/21 0600) SpO2:  [95 %-100 %] 96 % (09/21 0600) Weight:  [79.3 kg (174 lb 13.2 oz)-81.1 kg (178 lb 12.7 oz)] 174 lb 13.2 oz (79.3 kg) (09/21 0500) Weight change: -4.1 kg (-9 lb 0.6 oz)  Intake/Output from previous day: 09/20 0701 - 09/21 0700 In: 1102 [I.V.:550; Blood:350; IV Piggyback:202] Out: 5570 [Urine:2250] Intake/Output this shift:    General appearance: alert Resp: clear to auscultation bilaterally Cardio: regular rate and rhythm, S1, S2 normal, no murmur, click, rub or gallop GI: soft, non-tender; bowel sounds normal; no masses,  no organomegaly Extremities: extremities normal, atraumatic, no cyanosis or edema  Lab Results:  Basename 01/20/11 0410 01/19/11 1414 01/19/11 0520  WBC 8.7 -- 6.9  HGB 9.6* 10.3* --  HCT 26.6* 29.2* --  PLT 181 -- 194   BMET:  Basename 01/20/11 0410 01/19/11 0520  NA 137 136  K 3.4* 3.9  CL 100 105  CO2 24 15*  GLUCOSE 86 97  BUN 40* 75*  CREATININE 5.11* 6.76*  CALCIUM 6.1* 5.1*   No results found for this basename: PTH:2 in the last 72 hours Iron Studies:  Basename 01/17/11 0944  IRON 165*  TIBC NOT CALC  TRANSFERRIN 116*  FERRITIN 144    Studies/Results: No results found.  I have reviewed the patient's current medications.  Assessment/Plan: Problem #1 renal failure patient status post hemodialysis yesterday pending creatinine is stable . Her potassium slightly low but her overall good.  Problem #2 diabetes her blood sugar seems to be reasonably controlled. Problem #3 history of anemia his accommodation apparent deficiency and anemia of chronic  disease patient on the Epogen H&H is stable. Problem #4 history of metabolic acidosis secondary to her renal failure her CO2 is 24 hence has improved Problem #5 history of CHF she is status post ultrafiltration she doesn't have any sign of fluid overload. Problem #6 history of hypocalcemia calcium have also improving. Problem #7 history of hyperphosphatemia she is on a binder. Recommendation patient will have Diatek placed  today We'll start dialysis again tomorrow. We'll try to make arrangement for patient to get dialysis as an outpatient were fully starting next week. And we'll check her CBC and basic metabolic panel tomorrow. His end of her followup thank you   LOS: 4 days   Tija Biss S 01/20/2011,7:13 AM

## 2011-01-20 NOTE — Progress Notes (Signed)
TB skin test on left arm read today at 0915. TB skin test negative.

## 2011-01-20 NOTE — Progress Notes (Signed)
Pt arrived to room 331 from ICU via bed. Pt stable and in no distress.

## 2011-01-21 ENCOUNTER — Inpatient Hospital Stay (HOSPITAL_COMMUNITY): Payer: Medicare Other

## 2011-01-21 DIAGNOSIS — K922 Gastrointestinal hemorrhage, unspecified: Secondary | ICD-10-CM

## 2011-01-21 DIAGNOSIS — R195 Other fecal abnormalities: Secondary | ICD-10-CM

## 2011-01-21 LAB — BASIC METABOLIC PANEL
BUN: 45 mg/dL — ABNORMAL HIGH (ref 6–23)
CO2: 24 mEq/L (ref 19–32)
Calcium: 5.7 mg/dL — CL (ref 8.4–10.5)
Glucose, Bld: 128 mg/dL — ABNORMAL HIGH (ref 70–99)
Potassium: 3.2 mEq/L — ABNORMAL LOW (ref 3.5–5.1)
Sodium: 136 mEq/L (ref 135–145)

## 2011-01-21 LAB — CBC
Hemoglobin: 9.3 g/dL — ABNORMAL LOW (ref 12.0–15.0)
MCH: 30.2 pg (ref 26.0–34.0)
MCHC: 34.6 g/dL (ref 30.0–36.0)
MCV: 87.3 fL (ref 78.0–100.0)
RBC: 3.08 MIL/uL — ABNORMAL LOW (ref 3.87–5.11)

## 2011-01-21 LAB — GLUCOSE, CAPILLARY: Glucose-Capillary: 175 mg/dL — ABNORMAL HIGH (ref 70–99)

## 2011-01-21 MED ORDER — CIPROFLOXACIN HCL 250 MG PO TABS
250.0000 mg | ORAL_TABLET | Freq: Two times a day (BID) | ORAL | Status: DC
  Administered 2011-01-21 – 2011-01-24 (×6): 250 mg via ORAL
  Filled 2011-01-21 (×6): qty 1

## 2011-01-21 MED ORDER — PANTOPRAZOLE SODIUM 40 MG PO TBEC
40.0000 mg | DELAYED_RELEASE_TABLET | Freq: Every day | ORAL | Status: DC
Start: 2011-01-21 — End: 2011-01-24
  Administered 2011-01-22 – 2011-01-23 (×2): 40 mg via ORAL
  Filled 2011-01-21 (×2): qty 1

## 2011-01-21 NOTE — Progress Notes (Signed)
NAMEMarland Foster  Shelby, Foster                ACCOUNT NO.:  [id number]  MEDICAL RECORD NO.:  [id number]  LOCATION:  A331                          FACILITY:  [facility]  PHYSICIAN:  Rustin Erhart G. Renard Matter, MD   DATE OF BIRTH:  [birth year]  DATE OF PROCEDURE: DATE OF DISCHARGE:                                PROGRESS NOTE   This patient remains alert.  She was moved from intensive care.  She did have Diatek catheter inserted for continued dialysis in [facility] yesterday, tolerated the trip over okay.  OBJECTIVE:  VITAL SIGNS:  Blood pressure 129/65, respirations 20, pulse 73, and temp 97.7. LUNGS:  Diminished breath sounds. HEART:  Regular rhythm. ABDOMEN:  Slightly distended with midline umbilical hernia. EXTREMITIES:  Brawny edema.  The patient's most current appropriate labs, hemoglobin 9.3, hematocrit 26.9 with BUN 45, creatinine 5.93.  ASSESSMENT: 1. Hypertension, controlled. 2. Diabetes, controlled. 3. Systolic and diastolic congestive heart failure with ejection     fraction of 20-25%. 4. Renal failure.  The patient is on dialysis. 5. Anemia with previous blood transfusions.  Current hemoglobin 9.3,     hematocrit 26.9. 6. Metabolic acidosis which is improving.  PLAN:  To continue current regimen.  The patient is scheduled for dialysis today.     Pax Reasoner G. Renard Matter, MD     AGM/MEDQ  D:  01/21/2011  T:  01/21/2011  Job:  [id number]

## 2011-01-21 NOTE — Progress Notes (Signed)
Subjective: Interval History: has no complaint of nausea or vomiting. She denies also any shortness of breath orthopnea. Her appetite seems to be getting better..  Objective: Vital signs in last 24 hours: Temp:  [97.7 F (36.5 C)-98 F (36.7 C)] 97.7 F (36.5 C) (09/22 0600) Pulse Rate:  [72-86] 73  (09/22 0600) Resp:  [16-20] 20  (09/22 0600) BP: (115-164)/(54-122) 129/65 mmHg (09/22 0600) SpO2:  [94 %-97 %] 96 % (09/22 0600) Weight change:   Intake/Output from previous day: 09/21 0701 - 09/22 0700 In: 227 [P.O.:125; IV Piggyback:102] Out: 750 [Urine:750] Intake/Output this shift:    General appearance: alert Resp: clear to auscultation bilaterally Cardio: regular rate and rhythm, S1, S2 normal, no murmur, click, rub or gallop GI: soft, non-tender; bowel sounds normal; no masses,  no organomegaly Extremities: extremities normal, atraumatic, no cyanosis or edema  Lab Results:  Basename 01/21/11 0545 01/20/11 0410  WBC 8.0 8.7  HGB 9.3* 9.6*  HCT 26.9* 26.6*  PLT 195 181   BMET:  Basename 01/21/11 0545 01/20/11 0410  NA 136 137  K 3.2* 3.4*  CL 100 100  CO2 24 24  GLUCOSE 128* 86  BUN 45* 40*  CREATININE 5.93* 5.11*  CALCIUM 5.7* 6.1*   No results found for this basename: PTH:2 in the last 72 hours Iron Studies:  Basename 01/20/11 0410  IRON 39*  TIBC 151*  TRANSFERRIN --  FERRITIN 268    Studies/Results: No results found.  I have reviewed the patient's current medications.  Assessment/Plan: Problem #1 renal failure at this moment seems to be chronic patient is on hemodialysis. Patient has this moment doesn't have any uremic sinus symptoms. Her pending creatinine is stable. Problem #2 hypokalemia potassium is 3.2 seems to be low Problem #3 history of anemia possibly related to chronic renal failure she is just started on Epogen H&H has this moment seems to be stable. Problem #4 hypocalcemia calcium still low patient on the calcium carbonate and she is  also on high calcium bath. Problem #5 history of hyperphosphatemia she is on a binder Problem #6 history of metabolic acidosis that seems to have been corrected. Problem #7 history of diabetes her blood sugar seems to be reasonably controlled Problem #8 history of CHF patient doesn't seem to have any sign of fluid overload. Recommendation we'll change  her potassium bath to 4k We'll continue hwith other medications We'll remove her femoral catheter was unable to use her ditek catheter. This is end of followup thank you    LOS: 5 days   Rosilyn Coachman S 01/21/2011,8:41 AM

## 2011-01-21 NOTE — Progress Notes (Signed)
rec'd report from D Evans,rn. pts 3rd hd tx.  Newly placed rij cath placement verified prior to tx.  Talked about new cath and care of it on and off HD.

## 2011-01-21 NOTE — Progress Notes (Signed)
Subjective: Seen in dialysis. Patient appears in good spirits. She is tolerating her diet. She denies abdominal pain nausea or vomiting.  She also denies dysphagia at this time.    Objective: Vital signs in last 24 hours: Temp:  [97.7 F (36.5 C)-98 F (36.7 C)] 97.9 F (36.6 C) (09/22 1040) Pulse Rate:  [69-86] 73  (09/22 1330) Resp:  [16-20] 17  (09/22 1040) BP: (113-164)/(54-122) 121/71 mmHg (09/22 1330) SpO2:  [94 %-98 %] 98 % (09/22 1040) Last BM Date: 01/20/11 General:   Alert,  somewhat chronically ill-appearing, pleasant and cooperative in NAD Abdomen:  Mildly distended. Positive bowel sounds. Easily reducible umbilical hernia soft and nontender    Intake/Output from previous day: 09/21 0701 - 09/22 0700 In: 227 [P.O.:125; IV Piggyback:102] Out: 750 [Urine:750]    Lab Results:  [facility] 01/21/11 0545 01/20/11 0410 01/19/11 1414 01/19/11 0520  WBC 8.0 8.7 -- 6.9  HGB 9.3* 9.6* 10.3* --  HCT 26.9* 26.6* 29.2* --  PLT 195 181 -- 194   BMET  Basename 01/21/11 0545 01/20/11 0410 01/19/11 0520  NA 136 137 136  K 3.2* 3.4* 3.9  CL 100 100 105  CO2 24 24 15*  GLUCOSE 128* 86 97  BUN 45* 40* 75*  CREATININE 5.93* 5.11* 6.76*  CALCIUM 5.7* 6.1* 5.1*   Basename 01/19/11 1414  LABPROT 15.4*  INR 1.19         HP serologies pending at the time of this note    Assessment: No GI symptoms at this time. Dysphagia has improved.  Recommendations: Endoscopic evaluation and workup for possible chronic underlying liver disease at a later date per Dr. Karilyn Cota. Followup on HP serologies.        LOS: 5 days       Eula Listen  01/21/2011, 1:57 PM

## 2011-01-22 LAB — CBC
MCH: 30.1 pg (ref 26.0–34.0)
MCV: 89.2 fL (ref 78.0–100.0)
Platelets: 191 10*3/uL (ref 150–400)
RDW: 15.7 % — ABNORMAL HIGH (ref 11.5–15.5)

## 2011-01-22 LAB — BASIC METABOLIC PANEL
BUN: 24 mg/dL — ABNORMAL HIGH (ref 6–23)
CO2: 28 mEq/L (ref 19–32)
Calcium: 6.2 mg/dL — CL (ref 8.4–10.5)
Creatinine, Ser: 4.3 mg/dL — ABNORMAL HIGH (ref 0.50–1.10)
Glucose, Bld: 111 mg/dL — ABNORMAL HIGH (ref 70–99)

## 2011-01-22 LAB — GLUCOSE, CAPILLARY: Glucose-Capillary: 153 mg/dL — ABNORMAL HIGH (ref 70–99)

## 2011-01-22 MED ORDER — SODIUM CHLORIDE 0.9 % IV SOLN: 100.0000 mL | INTRAVENOUS | Status: DC | PRN

## 2011-01-22 MED ORDER — SODIUM CHLORIDE 0.9 % IJ SOLN
INTRAMUSCULAR | Status: AC
  Administered 2011-01-22: 3 mL
  Filled 2011-01-22: qty 3

## 2011-01-22 MED ORDER — HEPARIN SODIUM (PORCINE) 1000 UNIT/ML DIALYSIS
2000.0000 [IU] | INTRAMUSCULAR | Status: DC | PRN
  Filled 2011-01-22: qty 2

## 2011-01-22 MED ORDER — CALCIUM CARBONATE 1250 MG/5ML PO SUSP
500.0000 mg | Freq: Every day | ORAL | Status: DC
  Administered 2011-01-22 – 2011-01-23 (×2): 500 mg via ORAL
  Filled 2011-01-22 (×3): qty 5

## 2011-01-22 MED ORDER — POTASSIUM CHLORIDE CRYS ER 20 MEQ PO TBCR
40.0000 meq | EXTENDED_RELEASE_TABLET | Freq: Once | ORAL | Status: AC
  Administered 2011-01-22: 40 meq via ORAL
  Filled 2011-01-22: qty 2

## 2011-01-22 NOTE — Progress Notes (Signed)
Subjective: Interval History: has no complaint of shortness of breath orthopnea or paroxysmal nocturnal dyspnea. Patient also denies any nausea vomiting appetite is getting better. She denies any chest pain..  Objective: Vital signs in last 24 hours: Temp:  [97.9 F (36.6 C)-99.2 F (37.3 C)] 99.2 F (37.3 C) (09/23 0530) Pulse Rate:  [69-73] 70  (09/23 0530) Resp:  [17-18] 18  (09/23 0530) BP: (103-128)/(48-72) 115/58 mmHg (09/23 0530) SpO2:  [93 %-98 %] 93 % (09/23 0530) Weight change:   Intake/Output from previous day: 09/22 0701 - 09/23 0700 In: 240 [P.O.:240] Out: 2900 [Urine:150] Intake/Output this shift:    General appearance: alert Resp: clear to auscultation bilaterally Cardio: regular rate and rhythm, S1, S2 normal, no murmur, click, rub or gallop GI: soft, non-tender; bowel sounds normal; no masses,  no organomegaly Extremities: extremities normal, atraumatic, no cyanosis or edema  Lab Results:  [facility] 01/22/11 0414 01/21/11 0545  WBC 6.5 8.0  HGB 9.2* 9.3*  HCT 27.3* 26.9*  PLT 191 195   BMET:  Basename 01/22/11 0414 01/21/11 0545  NA 134* 136  K 3.1* 3.2*  CL 96 100  CO2 28 24  GLUCOSE 111* 128*  BUN 24* 45*  CREATININE 4.30* 5.93*  CALCIUM 6.2* 5.7*   No results found for this basename: PTH:2 in the last 72 hours Iron Studies:  Basename 01/20/11 0410  IRON 39*  TIBC 151*  TRANSFERRIN --  FERRITIN 268    Studies/Results: Ir Fluoro Guide Cv Line Right  01/21/2011  *RADIOLOGY REPORT*  Clinical data: Chronic renal insufficiency, needs access for dialysis  TUNNELED HEMODIALYSIS CATHETER PLACEMENT WITH ULTRASOUND AND FLUOROSCOPIC GUIDANCE:  Comparison: None  Technique: The procedure, risks, benefits, and alternatives were explained to the patient.  Questions regarding the procedure were encouraged and answered.  The patient understands and consents to the procedure.  As antibiotic prophylaxis, cefazolin 1 gram was ordered pre- procedure and  administered intravenously within 1 hour of incision.   Patency of the right IJ vein was confirmed with ultrasound with image documentation. An appropriate skin site was determined. Region was prepped using maximum barrier technique including cap and mask, sterile gown, sterile gloves, large sterile sheet, and Chlorhexidine   as cutaneous antisepsis. The region was infiltrated locally with 1% lidocaine.  Intravenous Fentanyl and Versed were administered as conscious sedation during continuous cardiorespiratory monitoring by the radiology RN, with a total moderate sedation time of 12 minutes.  Under real-time ultrasound guidance, the right IJ vein was accessed with a 21 gauge micropuncture needle; the needle tip within the vein was confirmed with ultrasound image documentation.   Needle exchanged over the 018 guidewire for transitional dilator, which allowed advancement of a Benson wire into the IVC. Over this, an MPA catheter was advanced. A Hemosplit 19 hemodialysis catheter was tunneled from the right anterior chest wall approach to the right IJ dermatotomy site. The MPA catheter was exchanged over an Amplatz wire for serial vascular dilators which allow placement of a peel- away sheath, through which the catheter was advanced under intermittent fluoroscopy, positioned with its tips in the proximal and midright atrium. Spot chest radiograph confirms good catheter position. No pneumothorax. Catheter was flushed and primed per protocol. Catheter secured externally with O Prolene sutures. The right IJ   dermatotomy site was closed with  Dermabond. No immediate complication.  IMPRESSION:  1. Technically successful placement of tunneled right IJ hemodialysis catheter with ultrasound and fluoroscopic guidance. Ready for routine use.  Original Report Authenticated By: D.  DANIEL HASSELL III, M.D.   Ir US Guide Vasc Access Right  01/21/2011  *RADIOLOGY REPORT*  Clinical data: Chronic renal insufficiency, needs access  for dialysis  TUNNELED HEMODIALYSIS CATHETER PLACEMENT WITH ULTRASOUND AND FLUOROSCOPIC GUIDANCE:  Comparison: None  Technique: The procedure, risks, benefits, and alternatives were explained to the patient.  Questions regarding the procedure were encouraged and answered.  The patient understands and consents to the procedure.  As antibiotic prophylaxis, cefazolin 1 gram was ordered pre- procedure and administered intravenously within 1 hour of incision.   Patency of the right IJ vein was confirmed with ultrasound with image documentation. An appropriate skin site was determined. Region was prepped using maximum barrier technique including cap and mask, sterile gown, sterile gloves, large sterile sheet, and Chlorhexidine   as cutaneous antisepsis. The region was infiltrated locally with 1% lidocaine.  Intravenous Fentanyl and Versed were administered as conscious sedation during continuous cardiorespiratory monitoring by the radiology RN, with a total moderate sedation time of 12 minutes.  Under real-time ultrasound guidance, the right IJ vein was accessed with a 21 gauge micropuncture needle; the needle tip within the vein was confirmed with ultrasound image documentation.   Needle exchanged over the 018 guidewire for transitional dilator, which allowed advancement of a Benson wire into the IVC. Over this, an MPA catheter was advanced. A Hemosplit 19 hemodialysis catheter was tunneled from the right anterior chest wall approach to the right IJ dermatotomy site. The MPA catheter was exchanged over an Amplatz wire for serial vascular dilators which allow placement of a peel- away sheath, through which the catheter was advanced under intermittent fluoroscopy, positioned with its tips in the proximal and midright atrium. Spot chest radiograph confirms good catheter position. No pneumothorax. Catheter was flushed and primed per protocol. Catheter secured externally with O Prolene sutures. The right IJ   dermatotomy site  was closed with  Dermabond. No immediate complication.  IMPRESSION:  1. Technically successful placement of tunneled right IJ hemodialysis catheter with ultrasound and fluoroscopic guidance. Ready for routine use.  Original Report Authenticated By: Osa Craver, M.D.    I have reviewed the patient's current medications.  Assessment/Plan: Problem #1 end-stage renal disease she status post hemodialysis yesterday pending creatinine was in acceptable range. And she doesn't have any uremic sinus symptoms. Problem #2 history of hypokalemia potassium seems to be somewhat low Problem #3 history of anemia is secondary to chronic renal failure patient is on Epogen H&H is stable. : #4 hyperphosphatemia she is on calcium binder phosphorus has normalized.  Problem #5 history of hypocalcemia calcium seems to be improving. Problem #6 diabetes : #7 history of CHF patient doesn't have any sign of fluid overload. Problem #8 history of hypertension blood pressure seems to be controlled very well. Recommendation we'll continue to present management We'll give her potassium supplement 40 mEq today one dose. We'll check her CBC and basic metabolic panel in the morning. We'll do hemodialysis tomorrow and then discharged to be followed as an outpatient artery to clinic. We'll call surgery for femoral catheter removal. Would also remove her Foley catheter. This is end of followup thank you     LOS: 6 days   Tamela Elsayed S 01/22/2011,9:12 AM

## 2011-01-22 NOTE — Progress Notes (Signed)
NAMEMarland Kitchen  OYINKANSOLA, TRUAX                ACCOUNT NO.:  [id number]  MEDICAL RECORD NO.:  [id number]  LOCATION:  A331                          FACILITY:  [facility]  PHYSICIAN:  Evangelia Whitaker G. Renard Matter, MD   DATE OF BIRTH:  11/09/1945  DATE OF PROCEDURE: DATE OF DISCHARGE:                                PROGRESS NOTE   This patient remains alert and in good spirits.  She did have Diatek catheter inserted and removed.  She did have dialysis yesterday.  OBJECTIVE:  VITAL SIGNS:  Blood pressure 115/58, respirations 18, pulse 70, and temp 99.2. LUNGS:  Diminished breath sounds. HEART:  Regular rhythm. ABDOMEN:  Distended with midline umbilical hernia. EXTREMITIES:  Brawny edema.  CBC:  Current hemoglobin 9.2, hematocrit 27.3.  Current creatinine 5.93 with a BUN of 45.  ASSESSMENT: 1. Hypertension, fairly well controlled. 2. Diabetes, fairly well controlled. 3. Systolic and diastolic congestive heart failure with ejection     fraction of 20-25%. 4. Renal failure.  The patient is improving and her creatinines have     coming down some.  She is on dialysis. 5. She does have anemia with previous blood transfusions.  Current     hemoglobin 9.36. 6. She has metabolic acidosis which is improving.  PLAN:  To continue current regimen.     Mireille Lacombe G. Renard Matter, MD     AGM/MEDQ  D:  01/22/2011  T:  01/22/2011  Job:  [id number]

## 2011-01-23 ENCOUNTER — Inpatient Hospital Stay (HOSPITAL_COMMUNITY): Payer: Medicare Other

## 2011-01-23 LAB — BASIC METABOLIC PANEL
CO2: 27 mEq/L (ref 19–32)
Chloride: 98 mEq/L (ref 96–112)
Creatinine, Ser: 5.61 mg/dL — ABNORMAL HIGH (ref 0.50–1.10)

## 2011-01-23 LAB — GLUCOSE, CAPILLARY
Glucose-Capillary: 135 mg/dL — ABNORMAL HIGH (ref 70–99)
Glucose-Capillary: 186 mg/dL — ABNORMAL HIGH (ref 70–99)
Glucose-Capillary: 214 mg/dL — ABNORMAL HIGH (ref 70–99)

## 2011-01-23 LAB — CBC
HCT: 26.6 % — ABNORMAL LOW (ref 36.0–46.0)
MCV: 90.8 fL (ref 78.0–100.0)
Platelets: 188 10*3/uL (ref 150–400)
RBC: 2.93 MIL/uL — ABNORMAL LOW (ref 3.87–5.11)
WBC: 7.6 10*3/uL (ref 4.0–10.5)

## 2011-01-23 LAB — H. PYLORI ANTIBODY, IGG: H Pylori IgG: <0.4 {ISR}

## 2011-01-23 MED ORDER — SODIUM CHLORIDE 0.9 % IJ SOLN
INTRAMUSCULAR | Status: AC
  Filled 2011-01-23: qty 3

## 2011-01-23 MED ORDER — CARVEDILOL 12.5 MG PO TABS
12.5000 mg | ORAL_TABLET | Freq: Two times a day (BID) | ORAL | Status: DC
  Administered 2011-01-24: 12.5 mg via ORAL
  Filled 2011-01-23: qty 1

## 2011-01-23 NOTE — Consult Note (Signed)
CSW faxed client's info to [facility], in [place].  Spoke with Annabelle Harman at [facility] who confirmed that Pt has been accepted as a Pt and has an appointment tomorrow, 01/25/11, at 9am.  Informed Pt and her husband who agreed with D/C plan.

## 2011-01-23 NOTE — Progress Notes (Signed)
  Surgery.  Patient seen and evaluated.  Surgical evaluation requested to remove femoral HD catheter.  Currently has function catheter in R IJV.  Discussed removal with patient.  Removed without difficulty.  Pressure held.  Good hemostasis.  Dressing placed.

## 2011-01-23 NOTE — Progress Notes (Signed)
Subjective: Interval History: has no complaint of nausea or vomiting. She denies also any shortness of breath. Her appetite is good..  Objective: Vital signs in last 24 hours: Temp:  [98.9 F (37.2 C)-99.5 F (37.5 C)] 98.9 F (37.2 C) (09/24 0550) Pulse Rate:  [68-69] 68  (09/24 0550) Resp:  [18] 18  (09/24 0550) BP: (106-108)/(53) 108/53 mmHg (09/24 0550) SpO2:  [93 %-96 %] 93 % (09/24 0550) Weight change:   Intake/Output from previous day: 09/23 0701 - 09/24 0700 In: 240 [P.O.:240] Out: 150 [Urine:150] Intake/Output this shift:    General appearance: alert Resp: clear to auscultation bilaterally Cardio: regular rate and rhythm, S1, S2 normal, no murmur, click, rub or gallop GI: soft, non-tender; bowel sounds normal; no masses,  no organomegaly Extremities: extremities normal, atraumatic, no cyanosis or edema  Lab Results:  [facility] 01/23/11 0444 01/22/11 0414  WBC 7.6 6.5  HGB 8.8* 9.2*  HCT 26.6* 27.3*  PLT 188 191   BMET:  Basename 01/23/11 0444 01/22/11 0414  NA 134* 134*  K 4.2 3.1*  CL 98 96  CO2 27 28  GLUCOSE 142* 111*  BUN 34* 24*  CREATININE 5.61* 4.30*  CALCIUM 5.9* 6.2*   No results found for this basename: PTH:2 in the last 72 hours Iron Studies: No results found for this basename: IRON,TIBC,TRANSFERRIN,FERRITIN in the last 72 hours  Studies/Results: No results found.  I have reviewed the patient's current medications.  Assessment/Plan: Problem #1 end-stage renal disease she status post hemodialysis pending creatinine was in acceptable range normal potassium. Problem #2 history of diabetes her blood sugar seems to be reasonably controlled. Problem #3 history of anemia this is thought to be secondary to iron deficiency and anemia of her chronic disease patient presently on Epogen H&H is slightly low but stable her. Problem #4 history of her hyperphosphatemia her phosphorus level has corrected. Problem #5 history of hypocalcemia she is on  calcium supplement Problem #6 history of diabetes she's on hydralazine blood pressure seems to be controlled very well. Recommendation I will make arrangements for patient to get dialysis tomorrow if she is in the hospital. However if patient is discharged will make arrangement as outpatient. We'll continue with the Epogen and also calcium supplement. This is end of her followup thank you    LOS: 7 days   Kaylin Schellenberg S 01/23/2011,8:54 AM

## 2011-01-23 NOTE — Progress Notes (Signed)
Subjective; patient has no complaints; she denies abdominal pain nausea vomiting or dysphagia. She states she has good appetite. Objective; she is finishing up on her breakfast and able to swallow without any difficulty. BP 108/53  Pulse 68  Temp(Src) 98.9 F (37.2 C) (Oral)  Resp 18  Ht 5\' 2"  (1.575 m)  Wt 174 lb 13.2 oz (79.3 kg)  BMI 31.98 kg/m2  SpO2 93% Her abdomen remains full. LE edema has decreased to 1-2+ and she has changes of stasis dermatitis involving skin of both legs. Lab data; WBC 7.6, hemoglobin 8.8, hematocrit 26.6, platelet count 188K. Serum sodium 134, potassium 4.2, chloride 98, CO2 77, BUN 34, creatinine 5.61, calcium is 5.9, phosphorus 4.3. Assessment; #1. Anemia. H&H low but stable and no evidence of active GI bleed. No lesion found on EGD to account for GI blood loss. H. pylori serology is pending. #2. Ascites. She did have single column of grade 1 esophageal varix on EGD. Need to rule out occult liver disease although ascites could be secondary to fluid overload due to chronic kidney disease. #3. End stage renal disease patient is now on hemodialysis. #4. Metabolic abnormalities secondary to end-stage renal disease including hypocalcemia which is being treated. Recommendations; Upper abdominal ultrasound in a.m.

## 2011-01-23 NOTE — Progress Notes (Signed)
Ca level of 5.9 reported to  Dr Renard Matter  This am, no new orders given @ this time.

## 2011-01-23 NOTE — Progress Notes (Signed)
NAMEMarland Kitchen  JAYDY, FITZHENRY                ACCOUNT NO.:  [id number]  MEDICAL RECORD NO.:  [id number]  LOCATION:  A331                          FACILITY:  [facility]  PHYSICIAN:  Leighton Luster G. Renard Matter, MD   DATE OF BIRTH:  October 08, 1945  DATE OF PROCEDURE: DATE OF DISCHARGE:                                PROGRESS NOTE   This patient remains alert and in good spirits.  She is scheduled for dialysis again today.  OBJECTIVE:  VITAL SIGNS:  Blood pressure 108/53, respirations 18, pulse 68, and temp 98.9. LUNGS:  Diminished breath sounds bilaterally. HEART:  Regular rhythm. ABDOMEN:  Slightly distended.  Midline umbilical hernia. EXTREMITIES:  Brawny edema.  ASSESSMENT: 1. Hypertension, controlled. 2. Diabetes, fairly well controlled. 3. Systolic and diastolic congestive heart failure with ejection     fraction of 20-25%, improved. 4. Renal failure which is improving with dialysis.  Most recent     creatinine on January 22, 2011, 4.3 with BUN 24, potassium 3.1. 5. Metabolic acidosis, improving.  PLAN:  To replete potassium, continue dialysis.  Cardiologist will see the patient again today.     Nox Talent G. Renard Matter, MD     AGM/MEDQ  D:  01/23/2011  T:  01/23/2011  Job:  [id number]

## 2011-01-23 NOTE — Progress Notes (Addendum)
SUBJECTIVE:No complaints, feeling better. No chest pain   Filed Vitals:   01/22/11 0530 01/22/11 2000 01/22/11 2045 01/23/11 0550  BP: 115/58  106/53 108/53  Pulse: 70  69 68  Temp: 99.2 F (37.3 C)  99.5 F (37.5 C) 98.9 F (37.2 C)  TempSrc: Oral  Oral   Resp: 18  18 18   Height:      Weight:      SpO2: 93% 96% 94% 93%    Intake/Output Summary (Last 24 hours) at 01/23/11 1028 Last data filed at 01/23/11 0900  Gross per 24 hour  Intake    300 ml  Output    150 ml  Net    150 ml    LABS: Basic Metabolic Panel:  Basename 01/23/11 0444 01/22/11 0414  NA 134* 134*  K 4.2 3.1*  CL 98 96  CO2 27 28  GLUCOSE 142* 111*  BUN 34* 24*  CREATININE 5.61* 4.30*  CALCIUM 5.9* 6.2*  MG -- --  PHOS 4.3 3.6   Liver Function Tests: No results found for this basename: AST:2,ALT:2,ALKPHOS:2,BILITOT:2,PROT:2,ALBUMIN:2 in the last 72 hours No results found for this basename: LIPASE:2,AMYLASE:2 in the last 72 hours CBC:  Basename 01/23/11 0444 01/22/11 0414  WBC 7.6 6.5  NEUTROABS -- --  HGB 8.8* 9.2*  HCT 26.6* 27.3*  MCV 90.8 89.2  PLT 188 191   ECHO: Left ventricle: The cavity size was normal. Wall thickness was increased in a pattern of mild LVH. There was mild focal basal hypertrophy of the septum. Systolic function was severely reduced. The estimated ejection fraction was in the range of 20% to 25%. Diffuse global hypokinesis.There is akinesis of the apical myocardium. There is severe hypokinesis of the anteroseptal myocardium. Features are consistent with a pseudonormal left ventricular filling pattern, with concomitant abnormal relaxation and increased filling pressure (grade 2 diastolic dysfunction). - Aortic valve: Mildly calcified annulus. Trileaflet; moderately calcified leaflets. Cusp separation was reduced. There was mild stenosis. No significant regurgitation. Mean gradient: 8mm Hg (S). - Mitral valve: Calcified annulus. Mild regurgitation. - Left atrium: The  atrium was moderately dilated. - Right atrium: The atrium was mildly dilated. - Tricuspid valve: Moderate regurgitation. - Pulmonic valve: Mild regurgitation. - Pulmonary arteries: Systolic pressure was moderately increased. PA peak pressure: 56mm Hg (S). - Inferior vena cava: The vessel was dilated; the respirophasic diameter changes were blunted (< 50%); findings are consistent with elevated central venous pressure.    RADIOLOGY: US Renal  01/17/2011  *RADIOLOGY REPORT*  Clinical Data: Renal failure, hypertension, diabetes, hypercholesterolemia  RENAL/URINARY TRACT ULTRASOUND COMPLETE   IMPRESSION: Bilateral increased renal cortical echogenicity suggesting medical renal disease. No evidence of hydronephrosis. Moderate ascites.  Original Report Authenticated By: Lollie Marrow, M.D.   Ir Fluoro Guide Cv Line Right  01/21/2011  *RADIOLOGY REPORT*  Clinical data: Chronic renal insufficiency, needs access for dialysis  TUNNELED HEMODIALYSIS CATHETER PLACEMENT WITH ULTRASOUND AND FLUOROSCOPIC GUIDANCE:   IMPRESSION:  1. Technically successful placement of tunneled right IJ hemodialysis catheter with ultrasound and fluoroscopic guidance. Ready for routine use.  Original Report Authenticated By: Osa Craver, M.D.   Ir US Guide Vasc Access Right  01/21/2011  *RADIOLOGY REPORT*  Clinical data: Chronic renal insufficiency, needs access for dialysis  TUNNELED HEMODIALYSIS CATHETER PLACEMENT WITH ULTRASOUND AND FLUOROSCOPIC GUIDANCE:  Comparison: No IMPRESSION:  1. Technically successful placement of tunneled right IJ hemodialysis catheter with ultrasound and fluoroscopic guidance. Ready for routine use.  Original Report Authenticated By: Thora Lance III,  M.D.   Dg Chest Portable 1 View  01/16/2011  *RADIOLOGY REPORT*  Clinical Data: Shortness of breath.  Wheezing.  PORTABLE CHEST - 1 VIEW  Comparison: Radiographs dated 03/22/2007 and 07/24/2003 and a CT scan of the abdomen dated  04/12/2007  Findings: Mild cardiomegaly with slight pulmonary vascular prominence and slight haziness at the lung bases suggesting mild edema.  Probable small bilateral pleural effusions.  Stable 12 mm nodule at the left lung base, described on the prior CT dated 04/12/2007.  IMPRESSION: Probable mild heart failure.  Mild edema with small effusions. Stable pulmonary nodule.  Original Report Authenticated By: Gwynn Burly, M.D.    PHYSICAL EXAM General: Well developed, well nourished, in no acute distress Head: Eyes PERRLA, No xanthomas.   Normal cephalic and atramatic  Lungs: Clear bilaterally to auscultation and percussion. Heart: HRRR S1 S2, with soft S4 murmur.  Pulses are 2+ & equal.            No carotid bruit. No JVD.  No abdominal bruits. No femoral bruits. Abdomen: Bowel sounds are positive, abdomen soft and non-tender without masses or                  Hernia's noted. Msk:  Back normal, normal gait. Normal strength and tone for age. Extremities: No clubbing, cyanosis or edema.  DP +1 Neuro: Alert and oriented X 3. 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 Acute CHF: Echocardiogram demonstrates systolic dysfx. EF of 20-25%. Grade II diastolic dysfx. 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 She continues dialysis and is having slow improvement of breathing status. Ascities is still present. She will need to have BB on board, BP should be within the 100 mmHg range.  HR is well controled.  Coreg  36.25  mg BID on board and titrate  up.  Hydralazine for afterload reduction is continuing.   2. Acute Renal Failure:Creatinine is still improving, dropping from highest 9.0 down to 5.61 after dialysis as managed by Dr. Fausto Skillern.     Bettey Mare. Lyman Bishop NP [facility]  Cardiology Attending  Patient interviewed and examined. Discussed with Joni Reining, NP.  Above note annotated and modified based upon my findings.  No weights recorded for the past 3 days.  I/O suggest total fluid removal of 6 liters.   Patient denies dyspnea or chest discomfort. Peripheral edema is markedly improved, but there is still substantial lichenification of the skin of the lower extremities, presumed related to chronic stasis. Outpatient dialysis is planned for Tuesdays, Thursdays and Saturdays. There is no impediment to discharge from a cardiac standpoint. I will be happy to follow her in the office, but I do not anticipate substantial cardiac problems once volume is controlled adequately with dialysis.   Bing, MD

## 2011-01-24 ENCOUNTER — Ambulatory Visit (HOSPITAL_COMMUNITY): Admission: RE | Admit: 2011-01-24 | Payer: Medicare Other | Source: Ambulatory Visit

## 2011-01-24 ENCOUNTER — Inpatient Hospital Stay (HOSPITAL_COMMUNITY): Payer: Medicare Other

## 2011-01-24 LAB — GLUCOSE, CAPILLARY: Glucose-Capillary: 133 mg/dL — ABNORMAL HIGH (ref 70–99)

## 2011-01-24 MED ORDER — HYDRALAZINE HCL 50 MG PO TABS: 50.0000 mg | ORAL_TABLET | Freq: Three times a day (TID) | ORAL | Status: DC

## 2011-01-24 MED ORDER — ISOSORBIDE MONONITRATE ER 60 MG PO TB24: 60.0000 mg | ORAL_TABLET | Freq: Every day | ORAL | Status: DC

## 2011-01-24 MED ORDER — PANTOPRAZOLE SODIUM 40 MG PO TBEC: 40.0000 mg | DELAYED_RELEASE_TABLET | Freq: Every day | ORAL | Status: DC

## 2011-01-24 MED ORDER — CARVEDILOL 12.5 MG PO TABS: 12.5000 mg | ORAL_TABLET | Freq: Two times a day (BID) | ORAL | Status: DC

## 2011-01-24 NOTE — Discharge Summary (Signed)
NAMEMarland Kitchen  [name], [name]                ACCOUNT NO.:  [id number]  MEDICAL RECORD NO.:  [id number]  LOCATION:  A331                          FACILITY:  [facility]  PHYSICIAN:  Charnice Zwilling G. Renard Matter, MD   DATE OF BIRTH:  [birth year]  DATE OF ADMISSION:  01/16/2011 DATE OF DISCHARGE:  LH                              DISCHARGE SUMMARY   ADDENDUM  The patient was discharged on the following medications: 1. Calcium carbonate 500 mg daily at bedtime. 2. Coreg 12.5 mg 1 b.i.d. 3. Hydralazine 50 mg t.i.d. 4. Isosorbide 60 mg daily. 5. Protonix 40 mg daily.  The patient would need low dose basal insulin, 10 units of Levemir.     Santos Hardwick G. Renard Matter, MD     AGM/MEDQ  D:  01/24/2011  T:  01/24/2011  Job:  [id number]

## 2011-01-24 NOTE — Progress Notes (Signed)
Pt discharged home in stable condition. Pt going to [facility] dialysis appt after leaving hospital.   Pt and her husbands instructed on new meds and instructed to make follw up appt with Dr. Renard Matter on Thursday afternoon.  Pt transported by husband.

## 2011-01-24 NOTE — Discharge Summary (Signed)
NAMEMarland Foster  Shelby, Foster                ACCOUNT NO.:  [id number]  MEDICAL RECORD NO.:  [id number]  LOCATION:  A331                          FACILITY:  [facility]  PHYSICIAN:  Walker Paddack G. Renard Matter, MD   DATE OF BIRTH:  April 18, 1946  DATE OF ADMISSION:  01/16/2011 DATE OF DISCHARGE:  09/25/2012LH                              DISCHARGE SUMMARY   DIAGNOSES: 1. Hypertension. 2. End-stage renal failure. 3. Metabolic acidosis. 4. Anemia and compression of iron deficiency. 5. Anasarca. 6. Diabetes mellitus, insulin dependent. 7. Hyperphosphatemia. 8. Hypocalcemia. 9. 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  CONDITION:  Improved at the time of discharge.  HISTORY:  This 65 year old white female, presented to emergency room with a chief complaint of being short of breath, which had been progressively worst over the past month.  She was brought by EMS to the emergency room with these complaints.  Apparently, she had noted some edema in her extremities as well.  The patient apparently had not been taking any of her insulin for the previous 2 months because she could not afford them.  She does have a prior history of insulin-dependent diabetes, ruptured appendix with subsequent surgery, previous history of azotemia.  The patient was seen and evaluated by ED physician.  Lab data showed markedly low hemoglobin with blood count being 7.7 with hematocrit 22.9.  The patient did have a creatinine of 8.22 and BUN 97, sodium 142, potassium 4.9, chloride 109, CO2 9.  A BNP was greater than 17,000 with troponin 0.01.  Fecal occult blood was positive.  The patient was noted to be in heart failure, this had small pleural effusions, stable pulmonary nodule.  IV fluids were started.  The patient was subsequently admitted from ED.  PHYSICAL EXAMINATION ON ADMISSION:  VITAL SIGNS:  Alert patient with blood pressure 193/105, pulse 84, temperature 97.5, respirations 26. HEENT:  Eyes:   PERRLA.  TMs negative.  Oropharynx benign. NECK:  Supple.  No JVD or thyroid abnormalities. HEART:  Regular rhythm.  No murmurs.  No cardiomegaly noted. LUNGS:  Expiratory wheezes on the right. ABDOMEN:  The patient has umbilical hernia and distention of lower abdomen with probable ascitic fluid. EXTREMITIES:  The patient has brawny edema and discoloration of both distal lower extremities.  The patient was admitted to intensive care on nitroglycerin drip to help control her blood pressure.  Blood sugars were monitored and appropriate dosages of Novolin insulin by sliding scale was started.  The patient was started on IV Lasix with monitoring of urinary output.  GI consult was requested from Dr. Karilyn Cota with reference to her further gastrointestinal bleeding.  Packed RBCs was ordered and given, also was quite obvious that the patient would need consultation from Nephrology as well and she would need dialysis as well.  X-rays:  Portable chest x-ray showed mild heart failure, mild edema, small pleural effusions, stable pulmonary nodule.  Renal ultrasound, bilateral increased renal cortical echogenicity suggesting medical renal disease.  No evidence of hydronephrosis, moderate ascites noted.  Ultrasound of abdomen was completed, report not available.  Chest x-ray on September 24 showed stable moderate cardiomegaly with probable interstitial pulmonary  edema and small effusions.  LABORATORY DATA:  Most recent CBC:  WBC 6500 with hemoglobin 9.2, hematocrit 27.3.  Sodium 134, potassium 3.1, chloride 96, CO2 28, BUN 24, creatinine 4.30, calcium 6.2, this was on September 24.  On September 24, WBC 7.6 with hemoglobin 8.8, hematocrit 26.6.  Sodium 134, potassium 4.2, chloride 98, CO2 27, BUN 34, creatinine 5.61, calcium 5.9.  HOSPITAL COURSE:  On admission, the patient was placed on intravenous fluids.  She was continued on the following medications: 1. Protonix 40 mg daily. 2.  Isosorbide mononitrate 24-hour tablet 60 mg daily. 3. Insulin aspart 5 units t.i.d. 4. Hydralazine 50 mg t.i.d. 5. Epogen 10,000 units t.i.d. 6. Cipro 250 mg b.i.d. 7. Carvedilol 12.5 mg b.i.d. 8. Elemental calcium. 9. Calcium carbonate suspension 500 mg at bedtime.  The patient was placed on modified-carbohydrate diet.  The patient was initially seen in intensive care unit by GI Service.  There was some concern that the patient had GI bleed, initially account of her low hemoglobin.  It was felt that the patient was not medically stable enough to proceed with endoscopy and this was deferred.  She was seen in consultation by Dr. 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  The patient felt to be in CHF by chest x-ray and cardiomegaly.  He did ask Surgery to put hemodialysis catheter in and recommended that she started on dialysis which was accomplished.  She was also seen in consultation by Cardiology.  She did have echocardiogram which showed evidence of LVEF 20-25%.  This is thought to be mixed was some element of underlying ischemic heart disease based on risk factors.  They concurred that dialysis was in order.  They started hydralazine for afterload reduction, but held off beta-blocker until volume status was better, eventually transitioned to the ACE.  The patient slowly improved, had no further bleeding but did receive 2 units of packed RBCs and remained on high dosages of Lasix.  Chemistries were monitored throughout the remainder of stay, and extensively her hypertension was controlled.  She continued to receive blood as needed. Continued dialysis.  Her IV Lasix was 200 mg b.i.d.  By  September 21, she had received dialysis on 2 different occasions and had 4000 mL removed, await, it come down some.  On September 24, femoral catheter was removed and I will use the  dietary catheter.  Has a feeling at this point in time, she could be discharged and continued dialysis as an outpatient.     Ryley Teater G. Renard Matter, MD     AGM/MEDQ  D:  01/24/2011  T:  01/24/2011  Job:  [id number]

## 2011-01-27 ENCOUNTER — Encounter (HOSPITAL_COMMUNITY): Payer: Self-pay | Admitting: Internal Medicine

## 2011-01-31 ENCOUNTER — Emergency Department (HOSPITAL_COMMUNITY)
Admission: EM | Admit: 2011-01-31 | Discharge: 2011-01-31 | Disposition: A | Discharge status: Home / Self Care | Payer: Medicare Other | Attending: Emergency Medicine | Admitting: Emergency Medicine

## 2011-01-31 ENCOUNTER — Encounter (HOSPITAL_COMMUNITY): Payer: Self-pay | Admitting: Emergency Medicine

## 2011-01-31 ENCOUNTER — Emergency Department (HOSPITAL_COMMUNITY): Payer: Medicare Other

## 2011-01-31 DIAGNOSIS — R51 Headache: Secondary | ICD-10-CM | POA: Insufficient documentation

## 2011-01-31 DIAGNOSIS — S0990XA Unspecified injury of head, initial encounter: Secondary | ICD-10-CM | POA: Insufficient documentation

## 2011-01-31 DIAGNOSIS — E119 Type 2 diabetes mellitus without complications: Secondary | ICD-10-CM | POA: Insufficient documentation

## 2011-01-31 DIAGNOSIS — W07XXXA Fall from chair, initial encounter: Secondary | ICD-10-CM | POA: Insufficient documentation

## 2011-01-31 DIAGNOSIS — W19XXXA Unspecified fall, initial encounter: Secondary | ICD-10-CM

## 2011-01-31 DIAGNOSIS — S0101XA Laceration without foreign body of scalp, initial encounter: Secondary | ICD-10-CM

## 2011-01-31 DIAGNOSIS — S0100XA Unspecified open wound of scalp, initial encounter: Secondary | ICD-10-CM | POA: Insufficient documentation

## 2011-01-31 NOTE — ED Notes (Signed)
Dr Zammit at bedside. 

## 2011-01-31 NOTE — ED Notes (Addendum)
Patient arrives via EMS with c/o fall. Patient with witness fall outside of dialysis center. Bystanders stated that patient lost her balance with her cane and fell backwards. Patient did hit the back of her head. NO loss of consciousness per witnesses. EMS reports contusion to back of head. Patient fully immobilized by EMS PTA. Patient alert/oriented x 4. Patient completed dialysis today.

## 2011-01-31 NOTE — ED Provider Notes (Signed)
History     CSN: [id number] Arrival date & time: 01/31/2011  1:01 PM  Chief Complaint  Patient presents with  . Fall    LSB    (Consider location/radiation/quality/duration/timing/severity/associated sxs/prior treatment) Patient is a 65 y.o. female presenting with fall. The history is provided by the patient and a relative (The patient fell at her chair hit her head she may have had some loss of consciousness for a short period of time patient complains a headache no other areas of pain).  Fall Incident onset: This occurred just prior to arriving in er. Incident: sitting in chair. She fell from a height of 1 to 2 ft. She landed on a hard floor. The point of impact was the head. The pain is present in the head. The pain is at a severity of 3/10. The pain is mild. She was not ambulatory at the scene. There was no drug use involved in the accident. Associated symptoms include headaches. Pertinent negatives include no abdominal pain, no vomiting and no hematuria. Treatment on scene includes a backboard and a c-collar.    Past Medical History  Diagnosis Date  . Type 2 diabetes mellitus   . Hypercholesteremia   . Essential hypertension, benign   . UTI (urinary tract infection)   . Ruptured appendix   . Iron deficiency anemia   . Renal insufficiency     Creatinine 2.3 in 11/08  . Noncompliance     Past Surgical History  Procedure Date  . Appendectomy 2008    Exploratory laparotomy  . Left carpal tunnel sugrey   . Stitch granuloma excision 2009  . Esophagogastroduodenoscopy 01/19/2011    Procedure: ESOPHAGOGASTRODUODENOSCOPY (EGD);  Surgeon: Malissa Hippo, MD;  Location: [facility];  Service: Endoscopy;  Laterality: N/A;    Family History  Problem Relation Age of Onset  . Hypertension      History  Substance Use Topics  . Smoking status: Never Smoker   . Smokeless tobacco: Never Used  . Alcohol Use: No    OB History    Grav Para Term Preterm Abortions TAB SAB Ect Mult  Living                  Review of Systems  Constitutional: Negative for fatigue.  HENT: Negative for congestion, sinus pressure and ear discharge.   Eyes: Negative for discharge.  Respiratory: Negative for cough.   Cardiovascular: Negative for chest pain.  Gastrointestinal: Negative for vomiting, abdominal pain and diarrhea.  Genitourinary: Negative for frequency and hematuria.  Musculoskeletal: Negative for back pain.  Skin: Negative for rash.  Neurological: Positive for headaches. Negative for seizures.  Hematological: Negative.   Psychiatric/Behavioral: Negative for hallucinations.    Allergies  Review of patient's allergies indicates no known allergies.  Home Medications   Current Outpatient Rx  Name Route Sig Dispense Refill  . CARVEDILOL 12.5 MG PO TABS Oral Take 1 tablet (12.5 mg total) by mouth 2 (two) times daily with a meal.    . HYDRALAZINE HCL 50 MG PO TABS Oral Take 1 tablet (50 mg total) by mouth 3 (three) times daily. 60 tablet 5  . INSULIN GLARGINE 100 UNIT/ML [place] SOLN Subcutaneous Inject 20 Units into the skin at bedtime.      . ISOSORBIDE MONONITRATE CR 60 MG PO TB24 Oral Take 1 tablet (60 mg total) by mouth daily.    Marland Kitchen PANTOPRAZOLE SODIUM 40 MG PO TBEC Oral Take 1 tablet (40 mg total) by mouth daily at 12 noon.  BP 160/69  Pulse 80  Temp(Src) 97.6 F (36.4 C) (Oral)  Resp 20  Ht 5\' 2"  (1.575 m)  Wt 170 lb (77.111 kg)  BMI 31.09 kg/m2  SpO2 93%  Physical Exam  Constitutional: She is oriented to person, place, and time. She appears well-developed.  HENT:  Head: Normocephalic.  Right Ear: External ear normal.  Nose: Nose normal.       Patient has a 3 cm laceration to the occipital area  Eyes: Conjunctivae and EOM are normal. No scleral icterus.  Neck: Neck supple. No thyromegaly present.  Cardiovascular: Normal rate and regular rhythm.  Exam reveals no gallop and no friction rub.   No murmur heard. Pulmonary/Chest: No stridor. She has no  wheezes. She has no rales. She exhibits no tenderness.  Abdominal: She exhibits no distension. There is no tenderness. There is no rebound.  Musculoskeletal: Normal range of motion. She exhibits no edema.  Lymphadenopathy:    She has no cervical adenopathy.  Neurological: She is oriented to person, place, and time. Coordination normal.  Skin: No rash noted. No erythema.  Psychiatric: She has a normal mood and affect. Her behavior is normal.    ED Course  LACERATION REPAIR Performed by: Karin Griffith L Authorized by: Bethann Berkshire L Consent: Verbal consent obtained. Risks and benefits discussed: Patient has a 3 cm laceration to the occipital area verbal consent was obtained from patient area was cleaned thoroughly with Betadine no foreign bodies were found in the laceration 4 staples were used to close laceration patient tolerated the procedure we.   (including critical care time)  Labs Reviewed - No data to display Ct Head Wo Contrast  01/31/2011  *RADIOLOGY REPORT*  Clinical Data:  With this fall backwards, laceration at posterior head, neck pain, history hypertension, diabetes  CT HEAD WITHOUT CONTRAST CT CERVICAL SPINE WITHOUT CONTRAST  Technique:  Multidetector CT imaging of the head and cervical spine was performed following the standard protocol without intravenous contrast.  Multiplanar CT image reconstructions of the cervical spine were also generated.  Comparison:  None  CT HEAD  Findings: Generalized atrophy. Normal ventricular morphology. No midline shift or mass effect. Small vessel chronic ischemic changes of deep cerebral white matter. 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 No intracranial hemorrhage, mass lesion, or acute infarction. Large calcification identified at right tentorial edge, suspect large dural calcification. Visualized paranasal sinuses and mastoid air cells clear. Bones unremarkable. Atherosclerotic calcification of internal carotid and vertebral  arteries at skull base. Small right posterior parietal scalp hematoma.  IMPRESSION: Atrophy with small vessel chronic ischemic changes of deep cerebral white matter. 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 No acute intracranial abnormalities  CT CERVICAL SPINE  Findings: . Visualized skull base intact. Multilevel facet degenerative changes of the cervical spine, greatest at right C2-C3 and C3-C4. Vertebral body heights maintained. Disc space narrowing at C6-C7 with 3 mm of spondylolisthesis, likely due to a combination of degenerative disc and facet disease. Large defect identified at the inferior endplate of C6 with a well circumscribed low attenuation focus within the C6 vertebral body, with a well-defined sclerotic rim, likely representing a large geode or Schmorl's node. No fracture, additional subluxation, or additional bone destruction. Prevertebral soft tissues normal thickness.  Bones appear demineralized. Atherosclerotic calcification of the common carotid arteries bilaterally. Right jugular dual-lumen central venous catheter. Lung apices clear.  IMPRESSION: Degenerative disc and facet disease changes of the cervical spine as above. 3 mm of anterolisthesis C6-C7,  likely degenerative. Large Schmorl's node versus geode at inferior C6. No definite acute bony abnormalities.  Original Report Authenticated By: Lollie Marrow, M.D.   Ct Cervical Spine Wo Contrast  01/31/2011  *RADIOLOGY REPORT*  Clinical Data:  With this fall backwards, laceration at posterior head, neck pain, history hypertension, diabetes  CT HEAD WITHOUT CONTRAST CT CERVICAL SPINE WITHOUT CONTRAST  Technique:  Multidetector CT imaging of the head and cervical spine was performed following the standard protocol without intravenous contrast.  Multiplanar CT image reconstructions of the cervical spine were also generated.  Comparison:  None  CT HEAD  Findings: Generalized atrophy. Normal ventricular morphology. No midline shift or  mass effect. Small vessel chronic ischemic changes of deep cerebral white matter. 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 No intracranial hemorrhage, mass lesion, or acute infarction. Large calcification identified at right tentorial edge, suspect large dural calcification. Visualized paranasal sinuses and mastoid air cells clear. Bones unremarkable. Atherosclerotic calcification of internal carotid and vertebral arteries at skull base. Small right posterior parietal scalp hematoma.  IMPRESSION: Atrophy with small vessel chronic ischemic changes of deep cerebral white matter. 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 No acute intracranial abnormalities  CT CERVICAL SPINE  Findings: . Visualized skull base intact. Multilevel facet degenerative changes of the cervical spine, greatest at right C2-C3 and C3-C4. Vertebral body heights maintained. Disc space narrowing at C6-C7 with 3 mm of spondylolisthesis, likely due to a combination of degenerative disc and facet disease. Large defect identified at the inferior endplate of C6 with a well circumscribed low attenuation focus within the C6 vertebral body, with a well-defined sclerotic rim, likely representing a large geode or Schmorl's node. No fracture, additional subluxation, or additional bone destruction. Prevertebral soft tissues normal thickness.  Bones appear demineralized. Atherosclerotic calcification of the common carotid arteries bilaterally. Right jugular dual-lumen central venous catheter. Lung apices clear.  IMPRESSION: Degenerative disc and facet disease changes of the cervical spine as above. 3 mm of anterolisthesis C6-C7, likely degenerative. Large Schmorl's node versus geode at inferior C6. No definite acute bony abnormalities.  Original Report Authenticated By: Lollie Marrow, M.D.     1. Fall   2. Head injury   3. Laceration of scalp    Results for orders placed during the hospital encounter of 01/16/11  CBC       Component Value Range   WBC 7.7  4.0 - 10.5 (K/uL)   RBC 2.63 (*) 3.87 - 5.11 (MIL/uL)   Hemoglobin 7.8 (*) 12.0 - 15.0 (g/dL)   HCT 04.5 (*) 40.9 - 46.0 (%)   MCV 87.1  78.0 - 100.0 (fL)   MCH 29.7  26.0 - 34.0 (pg)   MCHC 34.1  30.0 - 36.0 (g/dL)   RDW 81.1 (*) 91.4 - 15.5 (%)   Platelets 299  150 - 400 (K/uL)  DIFFERENTIAL      Component Value Range   Neutrophils Relative 83 (*) 43 - 77 (%)   Neutro Abs 6.4  1.7 - 7.7 (K/uL)   Lymphocytes Relative 7 (*) 12 - 46 (%)   Lymphs Abs 0.6 (*) 0.7 - 4.0 (K/uL)   Monocytes Relative 9  3 - 12 (%)   Monocytes Absolute 0.7  0.1 - 1.0 (K/uL)   Eosinophils Relative 1  0 - 5 (%)   Eosinophils Absolute 0.1  0.0 - 0.7 (K/uL)   Basophils Relative 0  0 - 1 (%)   Basophils Absolute 0.0  0.0 - 0.1 (K/uL)  BASIC METABOLIC PANEL      Component Value Range   Sodium 142  135 - 145 (mEq/L)   Potassium 4.9  3.5 - 5.1 (mEq/L)   Chloride 109  96 - 112 (mEq/L)   CO2 9 (*) 19 - 32 (mEq/L)   Glucose, Bld 95  70 - 99 (mg/dL)   BUN 97 (*) 6 - 23 (mg/dL)   Creatinine, Ser 8.11 (*) 0.50 - 1.10 (mg/dL)   Calcium 5.4 (*) 8.4 - 10.5 (mg/dL)   GFR calc non Af Amer 5 (*) >60 (mL/min)   GFR calc Af Amer 6 (*) >60 (mL/min)  PRO B NATRIURETIC PEPTIDE      Component Value Range   BNP, POC >70000.0 (*) 0 - 125 (pg/mL)  POCT I-STAT TROPONIN I      Component Value Range   Troponin i, poc 0.01  0.00 - 0.08 (ng/mL)   Comment 3           OCCULT BLOOD, POC DEVICE      Component Value Range   Fecal Occult Bld POSITIVE    GLUCOSE, CAPILLARY      Component Value Range   Glucose-Capillary 73  70 - 99 (mg/dL)   Comment 1 Notify RN     Comment 2 Documented in Chart    MRSA PCR SCREENING      Component Value Range   MRSA by PCR NEGATIVE  NEGATIVE   TYPE AND SCREEN      Component Value Range   ABO/RH(D) O POS     Antibody Screen NEG     Sample Expiration 01/19/2011     Unit Number [id number]     Blood Component Type RED CELLS,LR     Unit division 00     Status of  Unit ISSUED,FINAL     Transfusion Status OK TO TRANSFUSE     Crossmatch Result Compatible     Unit Number [id number]     Blood Component Type RED CELLS,LR     Unit division 00     Status of Unit ISSUED,FINAL     Transfusion Status OK TO TRANSFUSE     Crossmatch Result Compatible     Unit Number [id number]     Blood Component Type RED CELLS,LR     Unit division 00     Status of Unit ISSUED,FINAL     Transfusion Status OK TO TRANSFUSE     Crossmatch Result Compatible    PREPARE RBC (CROSSMATCH)      Component Value Range   Order Confirmation ORDER PROCESSED BY BLOOD BANK    GLUCOSE, CAPILLARY      Component Value Range   Glucose-Capillary 131 (*) 70 - 99 (mg/dL)   Comment 1 Documented in Chart     Comment 2 Notify RN    HEMOGLOBIN AND HEMATOCRIT, BLOOD      Component Value Range   Hemoglobin 7.6 (*) 12.0 - 15.0 (g/dL)   HCT 41.3 (*) 24.4 - 46.0 (%)  PREPARE RBC (CROSSMATCH)      Component Value Range   Order Confirmation ORDER PROCESSED BY BLOOD BANK    GLUCOSE, CAPILLARY      Component Value Range   Glucose-Capillary 78  70 - 99 (mg/dL)  URINALYSIS, ROUTINE W REFLEX MICROSCOPIC      Component Value Range   Color, Urine YELLOW  YELLOW    Appearance CLEAR  CLEAR    Specific Gravity, Urine 1.025  1.005 - 1.030    pH 5.5  5.0 -  8.0    Glucose, UA 100 (*) NEGATIVE (mg/dL)   Hgb urine dipstick LARGE (*) NEGATIVE    Bilirubin Urine NEGATIVE  NEGATIVE    Ketones, ur NEGATIVE  NEGATIVE (mg/dL)   Protein, ur 562 (*) NEGATIVE (mg/dL)   Urobilinogen, UA 0.2  0.0 - 1.0 (mg/dL)   Nitrite NEGATIVE  NEGATIVE    Leukocytes, UA SMALL (*) NEGATIVE   COMPREHENSIVE METABOLIC PANEL      Component Value Range   Sodium 140  135 - 145 (mEq/L)   Potassium 4.7  3.5 - 5.1 (mEq/L)   Chloride 112  96 - 112 (mEq/L)   CO2 9 (*) 19 - 32 (mEq/L)   Glucose, Bld 130 (*) 70 - 99 (mg/dL)   BUN 96 (*) 6 - 23 (mg/dL)   Creatinine, Ser 1.30 (*) 0.50 - 1.10 (mg/dL)   Calcium 5.1 (*) 8.4 - 10.5 (mg/dL)    Total Protein 5.6 (*) 6.0 - 8.3 (g/dL)   Albumin 2.7 (*) 3.5 - 5.2 (g/dL)   AST 12  0 - 37 (U/L)   ALT 13  0 - 35 (U/L)   Alkaline Phosphatase 99  39 - 117 (U/L)   Total Bilirubin 0.6  0.3 - 1.2 (mg/dL)   GFR calc non Af Amer 5 (*) >60 (mL/min)   GFR calc Af Amer 6 (*) >60 (mL/min)  CBC      Component Value Range   WBC 7.2  4.0 - 10.5 (K/uL)   RBC 2.83 (*) 3.87 - 5.11 (MIL/uL)   Hemoglobin 8.5 (*) 12.0 - 15.0 (g/dL)   HCT 86.5 (*) 78.4 - 46.0 (%)   MCV 87.6  78.0 - 100.0 (fL)   MCH 30.0  26.0 - 34.0 (pg)   MCHC 34.3  30.0 - 36.0 (g/dL)   RDW 69.6 (*) 29.5 - 15.5 (%)   Platelets 241  150 - 400 (K/uL)  FERRITIN      Component Value Range   Ferritin 144  10 - 291 (ng/mL)  TRANSFERRIN      Component Value Range   Transferrin 116 (*) 200 - 360 (mg/dL)  IRON AND TIBC      Component Value Range   Iron 165 (*) 42 - 135 (ug/dL)   TIBC NOT CALC  284 - 470 (ug/dL)   Saturation Ratios NOT CALC  20 - 55 (%)   UIBC <55 (*) 125 - 400 (ug/dL)  BLOOD GAS, ARTERIAL      Component Value Range   O2 Content 1.0     Delivery systems NASAL CANNULA     pH, Arterial 7.220 (*) 7.350 - 7.400    pCO2 arterial 18.8 (*) 35.0 - 45.0 (mmHg)   pO2, Arterial 140.0 (*) 80.0 - 100.0 (mmHg)   Bicarbonate 7.4 (*) 20.0 - 24.0 (mEq/L)   TCO2 7.2  0 - 100 (mmol/L)   Acid-base deficit 18.9 (*) 0.0 - 2.0 (mmol/L)   O2 Saturation 98.3     Collection site LEFT RADIAL     Drawn by COLLECTED BY RT     Sample type ARTERIAL     Allens test (pass/fail) PASS  PASS   PHOSPHORUS      Component Value Range   Phosphorus 10.7 (*) 2.3 - 4.6 (mg/dL)  GLUCOSE, CAPILLARY      Component Value Range   Glucose-Capillary 106 (*) 70 - 99 (mg/dL)  URINE MICROSCOPIC-ADD ON      Component Value Range   Squamous Epithelial / LPF RARE  RARE  WBC, UA 11-20  <3 (WBC/hpf)   RBC / HPF 21-50  <3 (RBC/hpf)   Bacteria, UA FEW (*) RARE   GLUCOSE, CAPILLARY      Component Value Range   Glucose-Capillary 119 (*) 70 - 99 (mg/dL)    Comment 1 Documented in Chart     Comment 2 Notify RN    PHOSPHORUS      Component Value Range   Phosphorus 10.3 (*) 2.3 - 4.6 (mg/dL)  GLUCOSE, CAPILLARY      Component Value Range   Glucose-Capillary 121 (*) 70 - 99 (mg/dL)   Comment 1 Notify RN    BASIC METABOLIC PANEL      Component Value Range   Sodium 138  135 - 145 (mEq/L)   Potassium 4.5  3.5 - 5.1 (mEq/L)   Chloride 107  96 - 112 (mEq/L)   CO2 10 (*) 19 - 32 (mEq/L)   Glucose, Bld 129 (*) 70 - 99 (mg/dL)   BUN 96 (*) 6 - 23 (mg/dL)   Creatinine, Ser 0.98 (*) 0.50 - 1.10 (mg/dL)   Calcium 5.0 (*) 8.4 - 10.5 (mg/dL)   GFR calc non Af Amer 4 (*) >60 (mL/min)   GFR calc Af Amer 5 (*) >60 (mL/min)  CBC      Component Value Range   WBC 8.2  4.0 - 10.5 (K/uL)   RBC 3.01 (*) 3.87 - 5.11 (MIL/uL)   Hemoglobin 9.2 (*) 12.0 - 15.0 (g/dL)   HCT 11.9 (*) 14.7 - 46.0 (%)   MCV 86.4  78.0 - 100.0 (fL)   MCH 30.6  26.0 - 34.0 (pg)   MCHC 35.4  30.0 - 36.0 (g/dL)   RDW 82.9 (*) 56.2 - 15.5 (%)   Platelets 226  150 - 400 (K/uL)  DIFFERENTIAL      Component Value Range   Neutrophils Relative 74  43 - 77 (%)   Neutro Abs 6.1  1.7 - 7.7 (K/uL)   Lymphocytes Relative 11 (*) 12 - 46 (%)   Lymphs Abs 0.9  0.7 - 4.0 (K/uL)   Monocytes Relative 13 (*) 3 - 12 (%)   Monocytes Absolute 1.0  0.1 - 1.0 (K/uL)   Eosinophils Relative 2  0 - 5 (%)   Eosinophils Absolute 0.2  0.0 - 0.7 (K/uL)   Basophils Relative 0  0 - 1 (%)   Basophils Absolute 0.0  0.0 - 0.1 (K/uL)  PRO B NATRIURETIC PEPTIDE      Component Value Range   BNP, POC 70000.0 (*) 0 - 125 (pg/mL)  GLUCOSE, CAPILLARY      Component Value Range   Glucose-Capillary 126 (*) 70 - 99 (mg/dL)   Comment 1 Documented in Chart     Comment 2 Notify RN    HEPATITIS B CORE ANTIBODY, IGM      Component Value Range   Hep B C IgM NEGATIVE  NEGATIVE   HEPATITIS B SURFACE ANTIGEN      Component Value Range   Hepatitis B Surface Ag NEGATIVE  NEGATIVE   HEPATITIS B SURFACE ANTIBODY       Component Value Range   Hep B S Ab NEGATIVE  NEGATIVE   GLUCOSE, CAPILLARY      Component Value Range   Glucose-Capillary 97  70 - 99 (mg/dL)   Comment 1 Notify RN     Comment 2 Documented in Chart    ALT      Component Value Range   ALT 13  0 -  35 (U/L)  GLUCOSE, CAPILLARY      Component Value Range   Glucose-Capillary 91  70 - 99 (mg/dL)   Comment 1 Notify RN     Comment 2 Documented in Chart    PHOSPHORUS      Component Value Range   Phosphorus 7.7 (*) 2.3 - 4.6 (mg/dL)  GLUCOSE, CAPILLARY      Component Value Range   Glucose-Capillary 112 (*) 70 - 99 (mg/dL)   Comment 1 Documented in Chart    BASIC METABOLIC PANEL      Component Value Range   Sodium 136  135 - 145 (mEq/L)   Potassium 3.9  3.5 - 5.1 (mEq/L)   Chloride 105  96 - 112 (mEq/L)   CO2 15 (*) 19 - 32 (mEq/L)   Glucose, Bld 97  70 - 99 (mg/dL)   BUN 75 (*) 6 - 23 (mg/dL)   Creatinine, Ser 1.61 (*) 0.50 - 1.10 (mg/dL)   Calcium 5.1 (*) 8.4 - 10.5 (mg/dL)   GFR calc non Af Amer 6 (*) >60 (mL/min)   GFR calc Af Amer 7 (*) >60 (mL/min)  CBC      Component Value Range   WBC 6.9  4.0 - 10.5 (K/uL)   RBC 2.70 (*) 3.87 - 5.11 (MIL/uL)   Hemoglobin 8.1 (*) 12.0 - 15.0 (g/dL)   HCT 09.6 (*) 04.5 - 46.0 (%)   MCV 85.9  78.0 - 100.0 (fL)   MCH 30.0  26.0 - 34.0 (pg)   MCHC 34.9  30.0 - 36.0 (g/dL)   RDW 40.9 (*) 81.1 - 15.5 (%)   Platelets 194  150 - 400 (K/uL)  GLUCOSE, CAPILLARY      Component Value Range   Glucose-Capillary 98  70 - 99 (mg/dL)   Comment 1 Notify RN     Comment 2 Documented in Chart    GLUCOSE, CAPILLARY      Component Value Range   Glucose-Capillary 93  70 - 99 (mg/dL)   Comment 1 Documented in Chart     Comment 2 Notify RN    PROTIME-INR      Component Value Range   Prothrombin Time 15.4 (*) 11.6 - 15.2 (seconds)   INR 1.19  0.00 - 1.49   HEMOGLOBIN AND HEMATOCRIT, BLOOD      Component Value Range   Hemoglobin 10.3 (*) 12.0 - 15.0 (g/dL)   HCT 91.4 (*) 78.2 - 46.0 (%)  APTT       Component Value Range   aPTT 33  24 - 37 (seconds)  GLUCOSE, CAPILLARY      Component Value Range   Glucose-Capillary 122 (*) 70 - 99 (mg/dL)   Comment 1 Notify RN     Comment 2 Documented in Chart    H. PYLORI ANTIBODY, IGG      Component Value Range   H Pylori IgG <0.40    VITAMIN B12      Component Value Range   Vitamin B-12 652  211 - 911 (pg/mL)  PHOSPHORUS      Component Value Range   Phosphorus 5.6 (*) 2.3 - 4.6 (mg/dL)  GLUCOSE, CAPILLARY      Component Value Range   Glucose-Capillary 109 (*) 70 - 99 (mg/dL)   Comment 1 Documented in Chart     Comment 2 Notify RN    FOLATE      Component Value Range   Folate 6.3    IRON AND TIBC      Component  Value Range   Iron 39 (*) 42 - 135 (ug/dL)   TIBC 629 (*) 528 - 413 (ug/dL)   Saturation Ratios 26  20 - 55 (%)   UIBC 112 (*) 125 - 400 (ug/dL)  FERRITIN      Component Value Range   Ferritin 268  10 - 291 (ng/mL)  RETICULOCYTES      Component Value Range   Retic Ct Pct 2.0  0.4 - 3.1 (%)   RBC. 3.07 (*) 3.87 - 5.11 (MIL/uL)   Retic Count, Manual 61.4  19.0 - 186.0 (K/uL)  CBC      Component Value Range   WBC 8.7  4.0 - 10.5 (K/uL)   RBC 3.07 (*) 3.87 - 5.11 (MIL/uL)   Hemoglobin 9.6 (*) 12.0 - 15.0 (g/dL)   HCT 24.4 (*) 01.0 - 46.0 (%)   MCV 86.6  78.0 - 100.0 (fL)   MCH 31.3  26.0 - 34.0 (pg)   MCHC 36.1 (*) 30.0 - 36.0 (g/dL)   RDW 27.2  53.6 - 64.4 (%)   Platelets 181  150 - 400 (K/uL)  BASIC METABOLIC PANEL      Component Value Range   Sodium 137  135 - 145 (mEq/L)   Potassium 3.4 (*) 3.5 - 5.1 (mEq/L)   Chloride 100  96 - 112 (mEq/L)   CO2 24  19 - 32 (mEq/L)   Glucose, Bld 86  70 - 99 (mg/dL)   BUN 40 (*) 6 - 23 (mg/dL)   Creatinine, Ser 0.34 (*) 0.50 - 1.10 (mg/dL)   Calcium 6.1 (*) 8.4 - 10.5 (mg/dL)   GFR calc non Af Amer 8 (*) >60 (mL/min)   GFR calc Af Amer 10 (*) >60 (mL/min)  GLUCOSE, CAPILLARY      Component Value Range   Glucose-Capillary 96  70 - 99 (mg/dL)  GLUCOSE, CAPILLARY       Component Value Range   Glucose-Capillary 109 (*) 70 - 99 (mg/dL)   Comment 1 Documented in Chart     Comment 2 Notify RN    GLUCOSE, CAPILLARY      Component Value Range   Glucose-Capillary 153 (*) 70 - 99 (mg/dL)   Comment 1 Notify RN     Comment 2 Documented in Chart    BASIC METABOLIC PANEL      Component Value Range   Sodium 136  135 - 145 (mEq/L)   Potassium 3.2 (*) 3.5 - 5.1 (mEq/L)   Chloride 100  96 - 112 (mEq/L)   CO2 24  19 - 32 (mEq/L)   Glucose, Bld 128 (*) 70 - 99 (mg/dL)   BUN 45 (*) 6 - 23 (mg/dL)   Creatinine, Ser 7.42 (*) 0.50 - 1.10 (mg/dL)   Calcium 5.7 (*) 8.4 - 10.5 (mg/dL)   GFR calc non Af Amer 7 (*) >60 (mL/min)   GFR calc Af Amer 9 (*) >60 (mL/min)  CBC      Component Value Range   WBC 8.0  4.0 - 10.5 (K/uL)   RBC 3.08 (*) 3.87 - 5.11 (MIL/uL)   Hemoglobin 9.3 (*) 12.0 - 15.0 (g/dL)   HCT 59.5 (*) 63.8 - 46.0 (%)   MCV 87.3  78.0 - 100.0 (fL)   MCH 30.2  26.0 - 34.0 (pg)   MCHC 34.6  30.0 - 36.0 (g/dL)   RDW 75.6  43.3 - 29.5 (%)   Platelets 195  150 - 400 (K/uL)  GLUCOSE, CAPILLARY      Component Value  Range   Glucose-Capillary 159 (*) 70 - 99 (mg/dL)   Comment 1 Notify RN     Comment 2 Documented in Chart    PHOSPHORUS      Component Value Range   Phosphorus 6.0 (*) 2.3 - 4.6 (mg/dL)  GLUCOSE, CAPILLARY      Component Value Range   Glucose-Capillary 113 (*) 70 - 99 (mg/dL)  BASIC METABOLIC PANEL      Component Value Range   Sodium 134 (*) 135 - 145 (mEq/L)   Potassium 3.1 (*) 3.5 - 5.1 (mEq/L)   Chloride 96  96 - 112 (mEq/L)   CO2 28  19 - 32 (mEq/L)   Glucose, Bld 111 (*) 70 - 99 (mg/dL)   BUN 24 (*) 6 - 23 (mg/dL)   Creatinine, Ser 7.82 (*) 0.50 - 1.10 (mg/dL)   Calcium 6.2 (*) 8.4 - 10.5 (mg/dL)   GFR calc non Af Amer 10 (*) >60 (mL/min)   GFR calc Af Amer 13 (*) >60 (mL/min)  CBC      Component Value Range   WBC 6.5  4.0 - 10.5 (K/uL)   RBC 3.06 (*) 3.87 - 5.11 (MIL/uL)   Hemoglobin 9.2 (*) 12.0 - 15.0 (g/dL)   HCT 95.6 (*)  21.3 - 46.0 (%)   MCV 89.2  78.0 - 100.0 (fL)   MCH 30.1  26.0 - 34.0 (pg)   MCHC 33.7  30.0 - 36.0 (g/dL)   RDW 08.6 (*) 57.8 - 15.5 (%)   Platelets 191  150 - 400 (K/uL)  GLUCOSE, CAPILLARY      Component Value Range   Glucose-Capillary 171 (*) 70 - 99 (mg/dL)   Comment 1 Notify RN     Comment 2 Documented in Chart    GLUCOSE, CAPILLARY      Component Value Range   Glucose-Capillary 175 (*) 70 - 99 (mg/dL)  PHOSPHORUS      Component Value Range   Phosphorus 3.6  2.3 - 4.6 (mg/dL)  GLUCOSE, CAPILLARY      Component Value Range   Glucose-Capillary 127 (*) 70 - 99 (mg/dL)  CBC      Component Value Range   WBC 7.6  4.0 - 10.5 (K/uL)   RBC 2.93 (*) 3.87 - 5.11 (MIL/uL)   Hemoglobin 8.8 (*) 12.0 - 15.0 (g/dL)   HCT 46.9 (*) 62.9 - 46.0 (%)   MCV 90.8  78.0 - 100.0 (fL)   MCH 30.0  26.0 - 34.0 (pg)   MCHC 33.1  30.0 - 36.0 (g/dL)   RDW 52.8 (*) 41.3 - 15.5 (%)   Platelets 188  150 - 400 (K/uL)  BASIC METABOLIC PANEL      Component Value Range   Sodium 134 (*) 135 - 145 (mEq/L)   Potassium 4.2  3.5 - 5.1 (mEq/L)   Chloride 98  96 - 112 (mEq/L)   CO2 27  19 - 32 (mEq/L)   Glucose, Bld 142 (*) 70 - 99 (mg/dL)   BUN 34 (*) 6 - 23 (mg/dL)   Creatinine, Ser 2.44 (*) 0.50 - 1.10 (mg/dL)   Calcium 5.9 (*) 8.4 - 10.5 (mg/dL)   GFR calc non Af Amer 8 (*) >60 (mL/min)   GFR calc Af Amer 9 (*) >60 (mL/min)  GLUCOSE, CAPILLARY      Component Value Range   Glucose-Capillary 153 (*) 70 - 99 (mg/dL)  PHOSPHORUS      Component Value Range   Phosphorus 4.3  2.3 - 4.6 (mg/dL)  GLUCOSE,  CAPILLARY      Component Value Range   Glucose-Capillary 135 (*) 70 - 99 (mg/dL)  GLUCOSE, CAPILLARY      Component Value Range   Glucose-Capillary 186 (*) 70 - 99 (mg/dL)  GLUCOSE, CAPILLARY      Component Value Range   Glucose-Capillary 214 (*) 70 - 99 (mg/dL)  GLUCOSE, CAPILLARY      Component Value Range   Glucose-Capillary 162 (*) 70 - 99 (mg/dL)  GLUCOSE, CAPILLARY      Component Value Range    Glucose-Capillary 133 (*) 70 - 99 (mg/dL)   Comment 1 Notify RN     Comment 2 Documented in Chart     Dg Chest 2 View  01/23/2011  *RADIOLOGY REPORT*  Clinical Data: Shortness of breath and weakness  CHEST - 2 VIEW  Comparison: 01/16/2011  Findings: Interval placement of right IJ approach dialysis catheter noted, but with tip terminating over the distal SVC.  Moderate enlargement of the cardiomediastinal silhouette again noted with central vascular congestion.  A few Kerley B lines are noted peripherally with small pleural effusions.  Left lower lobe pulmonary nodule not well seen as on prior exams.  No new finding. No pneumothorax.  IMPRESSION: Stable moderate cardiomegaly with probable interstitial pulmonary edema and small effusions.  Original Report Authenticated By: Harrel Lemon, M.D.   Ct Head Wo Contrast  01/31/2011  *RADIOLOGY REPORT*  Clinical Data:  With this fall backwards, laceration at posterior head, neck pain, history hypertension, diabetes  CT HEAD WITHOUT CONTRAST CT CERVICAL SPINE WITHOUT CONTRAST  Technique:  Multidetector CT imaging of the head and cervical spine was performed following the standard protocol without intravenous contrast.  Multiplanar CT image reconstructions of the cervical spine were also generated.  Comparison:  None  CT HEAD  Findings: Generalized atrophy. Normal ventricular morphology. No midline shift or mass effect. Small vessel chronic ischemic changes of deep cerebral white matter. 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 No intracranial hemorrhage, mass lesion, or acute infarction. Large calcification identified at right tentorial edge, suspect large dural calcification. Visualized paranasal sinuses and mastoid air cells clear. Bones unremarkable. Atherosclerotic calcification of internal carotid and vertebral arteries at skull base. Small right posterior parietal scalp hematoma.  IMPRESSION: Atrophy with small vessel chronic ischemic  changes of deep cerebral white matter. 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 No acute intracranial abnormalities  CT CERVICAL SPINE  Findings: . Visualized skull base intact. Multilevel facet degenerative changes of the cervical spine, greatest at right C2-C3 and C3-C4. Vertebral body heights maintained. Disc space narrowing at C6-C7 with 3 mm of spondylolisthesis, likely due to a combination of degenerative disc and facet disease. Large defect identified at the inferior endplate of C6 with a well circumscribed low attenuation focus within the C6 vertebral body, with a well-defined sclerotic rim, likely representing a large geode or Schmorl's node. No fracture, additional subluxation, or additional bone destruction. Prevertebral soft tissues normal thickness.  Bones appear demineralized. Atherosclerotic calcification of the common carotid arteries bilaterally. Right jugular dual-lumen central venous catheter. Lung apices clear.  IMPRESSION: Degenerative disc and facet disease changes of the cervical spine as above. 3 mm of anterolisthesis C6-C7, likely degenerative. Large Schmorl's node versus geode at inferior C6. No definite acute bony abnormalities.  Original Report Authenticated By: Lollie Marrow, M.D.   Ct Cervical Spine Wo Contrast  01/31/2011  *RADIOLOGY REPORT*  Clinical Data:  With this fall backwards, laceration at posterior head, neck pain, history hypertension,  diabetes  CT HEAD WITHOUT CONTRAST CT CERVICAL SPINE WITHOUT CONTRAST  Technique:  Multidetector CT imaging of the head and cervical spine was performed following the standard protocol without intravenous contrast.  Multiplanar CT image reconstructions of the cervical spine were also generated.  Comparison:  None  CT HEAD  Findings: Generalized atrophy. Normal ventricular morphology. No midline shift or mass effect. Small vessel chronic ischemic changes of deep cerebral white matter. Probable small old lacunar infarct left  basal ganglia. No intracranial hemorrhage, mass lesion, or acute infarction. Large calcification identified at right tentorial edge, suspect large dural calcification. Visualized paranasal sinuses and mastoid air cells clear. Bones unremarkable. Atherosclerotic calcification of internal carotid and vertebral arteries at skull base. Small right posterior parietal scalp hematoma.  IMPRESSION: Atrophy with small vessel chronic ischemic changes of deep cerebral white matter. 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 No acute intracranial abnormalities  CT CERVICAL SPINE  Findings: . Visualized skull base intact. Multilevel facet degenerative changes of the cervical spine, greatest at right C2-C3 and C3-C4. Vertebral body heights maintained. Disc space narrowing at C6-C7 with 3 mm of spondylolisthesis, likely due to a combination of degenerative disc and facet disease. Large defect identified at the inferior endplate of C6 with a well circumscribed low attenuation focus within the C6 vertebral body, with a well-defined sclerotic rim, likely representing a large geode or Schmorl's node. No fracture, additional subluxation, or additional bone destruction. Prevertebral soft tissues normal thickness.  Bones appear demineralized. Atherosclerotic calcification of the common carotid arteries bilaterally. Right jugular dual-lumen central venous catheter. Lung apices clear.  IMPRESSION: Degenerative disc and facet disease changes of the cervical spine as above. 3 mm of anterolisthesis C6-C7, likely degenerative. Large Schmorl's node versus geode at inferior C6. No definite acute bony abnormalities.  Original Report Authenticated By: Lollie Marrow, M.D.   US Renal  01/17/2011  *RADIOLOGY REPORT*  Clinical Data: Renal failure, hypertension, diabetes, hypercholesterolemia  RENAL/URINARY TRACT ULTRASOUND COMPLETE  Comparison:  Portable exam 1428 hours compared to11/25/2008  Findings:  Right Kidney:  11.3 cm length.   Mild cortical thinning.  Increased cortical echogenicity.  No mass, hydronephrosis shadowing calcification.  No perinephric fluid.  Left Kidney:  10.5 cm length.  Minimal cortical thinning. Increased cortical echogenicity.  No mass, hydronephrosis or shadowing calcification.  Bladder:  Decompressed by Foley catheter.  Incidental findings:  Moderate ascites.  IMPRESSION: Bilateral increased renal cortical echogenicity suggesting medical renal disease. No evidence of hydronephrosis. Moderate ascites.  Original Report Authenticated By: Lollie Marrow, M.D.   Ir Fluoro Guide Cv Line Right  01/21/2011  *RADIOLOGY REPORT*  Clinical data: Chronic renal insufficiency, needs access for dialysis  TUNNELED HEMODIALYSIS CATHETER PLACEMENT WITH ULTRASOUND AND FLUOROSCOPIC GUIDANCE:  Comparison: None  Technique: The procedure, risks, benefits, and alternatives were explained to the patient.  Questions regarding the procedure were encouraged and answered.  The patient understands and consents to the procedure.  As antibiotic prophylaxis, cefazolin 1 gram was ordered pre- procedure and administered intravenously within 1 hour of incision.   Patency of the right IJ vein was confirmed with ultrasound with image documentation. An appropriate skin site was determined. Region was prepped using maximum barrier technique including cap and mask, sterile gown, sterile gloves, large sterile sheet, and Chlorhexidine   as cutaneous antisepsis. The region was infiltrated locally with 1% lidocaine.  Intravenous Fentanyl and Versed were administered as conscious sedation during continuous cardiorespiratory monitoring by the radiology RN, with a total moderate sedation time of  12 minutes.  Under real-time ultrasound guidance, the right IJ vein was accessed with a 21 gauge micropuncture needle; the needle tip within the vein was confirmed with ultrasound image documentation.   Needle exchanged over the 018 guidewire for transitional dilator,  which allowed advancement of a Benson wire into the IVC. Over this, an MPA catheter was advanced. A Hemosplit 19 hemodialysis catheter was tunneled from the right anterior chest wall approach to the right IJ dermatotomy site. The MPA catheter was exchanged over an Amplatz wire for serial vascular dilators which allow placement of a peel- away sheath, through which the catheter was advanced under intermittent fluoroscopy, positioned with its tips in the proximal and midright atrium. Spot chest radiograph confirms good catheter position. No pneumothorax. Catheter was flushed and primed per protocol. Catheter secured externally with O Prolene sutures. The right IJ   dermatotomy site was closed with  Dermabond. No immediate complication.  IMPRESSION:  1. Technically successful placement of tunneled right IJ hemodialysis catheter with ultrasound and fluoroscopic guidance. Ready for routine use.  Original Report Authenticated By: Osa Craver, M.D.   Ir US Guide Vasc Access Right  01/21/2011  *RADIOLOGY REPORT*  Clinical data: Chronic renal insufficiency, needs access for dialysis  TUNNELED HEMODIALYSIS CATHETER PLACEMENT WITH ULTRASOUND AND FLUOROSCOPIC GUIDANCE:  Comparison: None  Technique: The procedure, risks, benefits, and alternatives were explained to the patient.  Questions regarding the procedure were encouraged and answered.  The patient understands and consents to the procedure.  As antibiotic prophylaxis, cefazolin 1 gram was ordered pre- procedure and administered intravenously within 1 hour of incision.   Patency of the right IJ vein was confirmed with ultrasound with image documentation. An appropriate skin site was determined. Region was prepped using maximum barrier technique including cap and mask, sterile gown, sterile gloves, large sterile sheet, and Chlorhexidine   as cutaneous antisepsis. The region was infiltrated locally with 1% lidocaine.  Intravenous Fentanyl and Versed were  administered as conscious sedation during continuous cardiorespiratory monitoring by the radiology RN, with a total moderate sedation time of 12 minutes.  Under real-time ultrasound guidance, the right IJ vein was accessed with a 21 gauge micropuncture needle; the needle tip within the vein was confirmed with ultrasound image documentation.   Needle exchanged over the 018 guidewire for transitional dilator, which allowed advancement of a Benson wire into the IVC. Over this, an MPA catheter was advanced. A Hemosplit 19 hemodialysis catheter was tunneled from the right anterior chest wall approach to the right IJ dermatotomy site. The MPA catheter was exchanged over an Amplatz wire for serial vascular dilators which allow placement of a peel- away sheath, through which the catheter was advanced under intermittent fluoroscopy, positioned with its tips in the proximal and midright atrium. Spot chest radiograph confirms good catheter position. No pneumothorax. Catheter was flushed and primed per protocol. Catheter secured externally with O Prolene sutures. The right IJ   dermatotomy site was closed with  Dermabond. No immediate complication.  IMPRESSION:  1. Technically successful placement of tunneled right IJ hemodialysis catheter with ultrasound and fluoroscopic guidance. Ready for routine use.  Original Report Authenticated By: Osa Craver, M.D.   Dg Chest Portable 1 View  01/16/2011  *RADIOLOGY REPORT*  Clinical Data: Shortness of breath.  Wheezing.  PORTABLE CHEST - 1 VIEW  Comparison: Radiographs dated 03/22/2007 and 07/24/2003 and a CT scan of the abdomen dated 04/12/2007  Findings: Mild cardiomegaly with slight pulmonary vascular prominence and slight haziness at  the lung bases suggesting mild edema.  Probable small bilateral pleural effusions.  Stable 12 mm nodule at the left lung base, described on the prior CT dated 04/12/2007.  IMPRESSION: Probable mild heart failure.  Mild edema with small  effusions. Stable pulmonary nodule.  Original Report Authenticated By: Gwynn Burly, M.D.         MDM  Fall with lacaeration        Benny Lennert, MD 01/31/11 [phone]

## 2011-01-31 NOTE — ED Notes (Signed)
Patient with no complaints at this time. Respirations even and unlabored. Skin warm/dry. Discharge instructions reviewed with patient at this time. Patient given opportunity to voice concerns/ask questions. Patient discharged at this time and left Emergency Department with steady gait.   

## 2011-02-06 LAB — CROSSMATCH
ABO/RH(D): O POS
Antibody Screen: NEGATIVE
Antibody Screen: NEGATIVE

## 2011-02-06 LAB — URINE CULTURE: Special Requests: NEGATIVE

## 2011-02-06 LAB — HEMOGLOBIN AND HEMATOCRIT, BLOOD
HCT: 28.4 — ABNORMAL LOW
Hemoglobin: 9.5 — ABNORMAL LOW

## 2011-02-06 LAB — BASIC METABOLIC PANEL
BUN: 12
BUN: 5 — ABNORMAL LOW
BUN: 5 — ABNORMAL LOW
CO2: 26
CO2: 27
CO2: 28
Calcium: 7.3 — ABNORMAL LOW
Calcium: 7.5 — ABNORMAL LOW
Calcium: 7.8 — ABNORMAL LOW
Calcium: 7.9 — ABNORMAL LOW
Chloride: 101
Chloride: 102
Chloride: 105
Chloride: 108
Creatinine, Ser: 0.67
Creatinine, Ser: 0.84
GFR calc Af Amer: >60
GFR calc Af Amer: >60
GFR calc Af Amer: >60
GFR calc Af Amer: >60
GFR calc non Af Amer: >60
GFR calc non Af Amer: >60
GFR calc non Af Amer: >60
Glucose, Bld: 101 — ABNORMAL HIGH
Glucose, Bld: 104 — ABNORMAL HIGH
Glucose, Bld: 116 — ABNORMAL HIGH
Potassium: 3.8
Potassium: 3.9
Potassium: 3.9
Potassium: 4.3
Potassium: 4.4
Sodium: 135
Sodium: 135
Sodium: 135
Sodium: 137

## 2011-02-06 LAB — CBC
HCT: 25.6 — ABNORMAL LOW
HCT: 27.7 — ABNORMAL LOW
HCT: 28.7 — ABNORMAL LOW
HCT: 30 — ABNORMAL LOW
HCT: 31.3 — ABNORMAL LOW
HCT: 32.3 — ABNORMAL LOW
Hemoglobin: 10.5 — ABNORMAL LOW
Hemoglobin: 10.8 — ABNORMAL LOW
Hemoglobin: 8.7 — ABNORMAL LOW
Hemoglobin: 9.7 — ABNORMAL LOW
MCHC: 33.3
MCHC: 33.4
MCHC: 33.8
MCHC: 34.3
MCV: 85.4
MCV: 86.6
MCV: 86.8
MCV: 86.8
MCV: 87.2
Platelets: 432 — ABNORMAL HIGH
Platelets: 707 — ABNORMAL HIGH
Platelets: 769 — ABNORMAL HIGH
RBC: 2.92 — ABNORMAL LOW
RBC: 2.96 — ABNORMAL LOW
RBC: 3.29 — ABNORMAL LOW
RBC: 3.32 — ABNORMAL LOW
RBC: 3.63 — ABNORMAL LOW
RBC: 3.72 — ABNORMAL LOW
RDW: 14.7
RDW: 15.1
RDW: 15.3
WBC: 18.5 — ABNORMAL HIGH
WBC: 18.7 — ABNORMAL HIGH
WBC: 19.1 — ABNORMAL HIGH
WBC: 20.7 — ABNORMAL HIGH

## 2011-02-06 LAB — RETICULOCYTES
RBC.: 2.91 — ABNORMAL LOW
RBC.: 3.01 — ABNORMAL LOW
Retic Ct Pct: 2.9
Retic Ct Pct: 3

## 2011-02-06 LAB — DIFFERENTIAL
Basophils Absolute: 0
Basophils Absolute: 0
Basophils Absolute: 0
Basophils Absolute: 0
Basophils Relative: 0
Basophils Relative: 0
Basophils Relative: 0
Basophils Relative: 0
Basophils Relative: 0
Eosinophils Absolute: 0 — ABNORMAL LOW
Eosinophils Absolute: 0.1 — ABNORMAL LOW
Eosinophils Absolute: 0.1 — ABNORMAL LOW
Eosinophils Relative: 0
Eosinophils Relative: 0
Eosinophils Relative: 0
Eosinophils Relative: 0
Eosinophils Relative: 1
Eosinophils Relative: 1
Lymphocytes Relative: 12
Lymphocytes Relative: 7 — ABNORMAL LOW
Lymphocytes Relative: 7 — ABNORMAL LOW
Lymphs Abs: 1.2
Lymphs Abs: 1.4
Monocytes Absolute: 1.1 — ABNORMAL HIGH
Monocytes Absolute: 1.3 — ABNORMAL HIGH
Monocytes Absolute: 1.6 — ABNORMAL HIGH
Monocytes Relative: 7
Monocytes Relative: 8
Monocytes Relative: 9
Neutro Abs: 13.7 — ABNORMAL HIGH
Neutro Abs: 15 — ABNORMAL HIGH
Neutro Abs: 15.3 — ABNORMAL HIGH
Neutro Abs: 15.7 — ABNORMAL HIGH
Neutro Abs: 15.9 — ABNORMAL HIGH
Neutro Abs: 16.3 — ABNORMAL HIGH
Neutrophils Relative %: 85 — ABNORMAL HIGH
Neutrophils Relative %: 85 — ABNORMAL HIGH
Neutrophils Relative %: 87 — ABNORMAL HIGH

## 2011-02-06 LAB — URINALYSIS, ROUTINE W REFLEX MICROSCOPIC
Bilirubin Urine: NEGATIVE
Glucose, UA: NEGATIVE
Ketones, ur: NEGATIVE
Protein, ur: 100 — AB

## 2011-02-06 LAB — VITAMIN B12: Vitamin B-12: 1485 — ABNORMAL HIGH (ref 211–911)

## 2011-02-06 LAB — FERRITIN: Ferritin: 860 — ABNORMAL HIGH (ref 10–291)

## 2011-02-06 LAB — IRON AND TIBC
Saturation Ratios: 19 — ABNORMAL LOW
UIBC: 58
UIBC: 60

## 2011-02-06 LAB — CLOSTRIDIUM DIFFICILE EIA

## 2011-02-07 LAB — BASIC METABOLIC PANEL
BUN: 25 — ABNORMAL HIGH
BUN: 36 — ABNORMAL HIGH
BUN: 37 — ABNORMAL HIGH
BUN: 38 — ABNORMAL HIGH
BUN: 41 — ABNORMAL HIGH
BUN: 41 — ABNORMAL HIGH
BUN: 42 — ABNORMAL HIGH
BUN: 42 — ABNORMAL HIGH
BUN: 50 — ABNORMAL HIGH
BUN: 57 — ABNORMAL HIGH
CO2: 16 — ABNORMAL LOW
CO2: 21
CO2: 23
CO2: 23
CO2: 33 — ABNORMAL HIGH
Calcium: 7 — ABNORMAL LOW
Calcium: 7.2 — ABNORMAL LOW
Calcium: 7.5 — ABNORMAL LOW
Calcium: 8 — ABNORMAL LOW
Calcium: 8.1 — ABNORMAL LOW
Calcium: 8.4
Chloride: 101
Chloride: 102
Chloride: 105
Chloride: 105
Chloride: 105
Chloride: 108
Chloride: 111
Chloride: 99
Creatinine, Ser: 0.86
Creatinine, Ser: 1.09
Creatinine, Ser: 1.18
Creatinine, Ser: 1.2
Creatinine, Ser: 1.25 — ABNORMAL HIGH
GFR calc Af Amer: 26 — ABNORMAL LOW
GFR calc Af Amer: 42 — ABNORMAL LOW
GFR calc Af Amer: 55 — ABNORMAL LOW
GFR calc Af Amer: >60
GFR calc Af Amer: >60
GFR calc non Af Amer: 35 — ABNORMAL LOW
GFR calc non Af Amer: 41 — ABNORMAL LOW
GFR calc non Af Amer: 41 — ABNORMAL LOW
GFR calc non Af Amer: 44 — ABNORMAL LOW
GFR calc non Af Amer: 46 — ABNORMAL LOW
GFR calc non Af Amer: 47 — ABNORMAL LOW
GFR calc non Af Amer: 53 — ABNORMAL LOW
GFR calc non Af Amer: >60
GFR calc non Af Amer: >60
Glucose, Bld: 111 — ABNORMAL HIGH
Glucose, Bld: 210 — ABNORMAL HIGH
Glucose, Bld: 273 — ABNORMAL HIGH
Glucose, Bld: 287 — ABNORMAL HIGH
Glucose, Bld: 288 — ABNORMAL HIGH
Glucose, Bld: 291 — ABNORMAL HIGH
Glucose, Bld: 318 — ABNORMAL HIGH
Glucose, Bld: 46 — ABNORMAL LOW
Glucose, Bld: 47 — ABNORMAL LOW
Glucose, Bld: 489 — ABNORMAL HIGH
Potassium: 3 — ABNORMAL LOW
Potassium: 3.4 — ABNORMAL LOW
Potassium: 3.5
Potassium: 3.6
Potassium: 3.8
Potassium: 3.9
Potassium: 4.6
Potassium: 5.5 — ABNORMAL HIGH
Sodium: 131 — ABNORMAL LOW
Sodium: 132 — ABNORMAL LOW
Sodium: 132 — ABNORMAL LOW
Sodium: 132 — ABNORMAL LOW
Sodium: 133 — ABNORMAL LOW
Sodium: 134 — ABNORMAL LOW
Sodium: 140

## 2011-02-07 LAB — DIFFERENTIAL
Band Neutrophils: 13 — ABNORMAL HIGH
Band Neutrophils: 74 — ABNORMAL HIGH
Basophils Absolute: 0
Basophils Absolute: 0
Basophils Absolute: 0
Basophils Absolute: 0
Basophils Absolute: 0
Basophils Absolute: 0.2 — ABNORMAL HIGH
Basophils Relative: 0
Basophils Relative: 0
Basophils Relative: 0
Basophils Relative: 0
Basophils Relative: 1
Blasts: 0
Blasts: 0
Eosinophils Absolute: 0 — ABNORMAL LOW
Eosinophils Absolute: 0 — ABNORMAL LOW
Eosinophils Absolute: 0 — ABNORMAL LOW
Eosinophils Absolute: 0 — ABNORMAL LOW
Eosinophils Absolute: 0 — ABNORMAL LOW
Eosinophils Absolute: 0 — ABNORMAL LOW
Eosinophils Absolute: 0 — ABNORMAL LOW
Eosinophils Absolute: 0 — ABNORMAL LOW
Eosinophils Absolute: 0 — ABNORMAL LOW
Eosinophils Absolute: 0 — ABNORMAL LOW
Eosinophils Absolute: 0.1 — ABNORMAL LOW
Eosinophils Absolute: 0.1 — ABNORMAL LOW
Eosinophils Absolute: 0.1 — ABNORMAL LOW
Eosinophils Absolute: 0.1 — ABNORMAL LOW
Eosinophils Relative: 0
Eosinophils Relative: 0
Eosinophils Relative: 0
Eosinophils Relative: 0
Eosinophils Relative: 0
Eosinophils Relative: 0
Eosinophils Relative: 0
Eosinophils Relative: 0
Eosinophils Relative: 0
Eosinophils Relative: 0
Eosinophils Relative: 1
Lymphocytes Relative: 12
Lymphocytes Relative: 12
Lymphocytes Relative: 14
Lymphocytes Relative: 3 — ABNORMAL LOW
Lymphocytes Relative: 4 — ABNORMAL LOW
Lymphocytes Relative: 5 — ABNORMAL LOW
Lymphocytes Relative: 7 — ABNORMAL LOW
Lymphs Abs: 0.7
Lymphs Abs: 1
Lymphs Abs: 1.6
Lymphs Abs: 1.6
Lymphs Abs: 1.8
Lymphs Abs: 1.8
Lymphs Abs: 2.4
Metamyelocytes Relative: 0
Metamyelocytes Relative: 0
Metamyelocytes Relative: 5
Monocytes Absolute: 0.1
Monocytes Absolute: 0.4
Monocytes Absolute: 0.5
Monocytes Absolute: 0.8
Monocytes Absolute: 0.8
Monocytes Absolute: 1.2 — ABNORMAL HIGH
Monocytes Absolute: 1.2 — ABNORMAL HIGH
Monocytes Absolute: 1.5 — ABNORMAL HIGH
Monocytes Absolute: 3.3 — ABNORMAL HIGH
Monocytes Relative: 3
Monocytes Relative: 5
Monocytes Relative: 7
Monocytes Relative: 7
Monocytes Relative: 7
Myelocytes: 0
Myelocytes: 0
Neutro Abs: 22.4 — ABNORMAL HIGH
Neutro Abs: 28 — ABNORMAL HIGH
Neutrophils Relative %: 74
Neutrophils Relative %: 75
Neutrophils Relative %: 89 — ABNORMAL HIGH
Neutrophils Relative %: 92 — ABNORMAL HIGH
Promyelocytes Absolute: 0
WBC Morphology: INCREASED
WBC Morphology: INCREASED
nRBC: 0

## 2011-02-07 LAB — HEPATIC FUNCTION PANEL
Albumin: 1.8 — ABNORMAL LOW
Alkaline Phosphatase: 217 — ABNORMAL HIGH
Bilirubin, Direct: 0.2
Bilirubin, Direct: 0.5 — ABNORMAL HIGH
Indirect Bilirubin: 0.7
Total Bilirubin: 1
Total Protein: 4.3 — ABNORMAL LOW

## 2011-02-07 LAB — CBC
HCT: 24.3 — ABNORMAL LOW
HCT: 26.1 — ABNORMAL LOW
HCT: 26.6 — ABNORMAL LOW
HCT: 28.3 — ABNORMAL LOW
HCT: 28.8 — ABNORMAL LOW
HCT: 29.2 — ABNORMAL LOW
HCT: 31.8 — ABNORMAL LOW
HCT: 32.6 — ABNORMAL LOW
HCT: 32.6 — ABNORMAL LOW
HCT: 32.7 — ABNORMAL LOW
HCT: 33.4 — ABNORMAL LOW
Hemoglobin: 11 — ABNORMAL LOW
Hemoglobin: 11.4 — ABNORMAL LOW
Hemoglobin: 8.1 — ABNORMAL LOW
Hemoglobin: 8.7 — ABNORMAL LOW
Hemoglobin: 8.7 — ABNORMAL LOW
Hemoglobin: 9.7 — ABNORMAL LOW
Hemoglobin: 9.8 — ABNORMAL LOW
Hemoglobin: 9.8 — ABNORMAL LOW
MCHC: 33.4
MCHC: 33.6
MCHC: 33.7
MCHC: 33.7
MCHC: 34
MCHC: 34.8
MCV: 84.6
MCV: 84.7
MCV: 84.8
MCV: 84.9
MCV: 85.1
MCV: 85.2
MCV: 85.7
MCV: 85.8
MCV: 86
MCV: 86.1
MCV: 86.1
MCV: 87.3
Platelets: 150
Platelets: 169
Platelets: 177
Platelets: 248
Platelets: 279
Platelets: 286
Platelets: 332
Platelets: 334
Platelets: 364
Platelets: 383
Platelets: 386
RBC: 3.14 — ABNORMAL LOW
RBC: 3.39 — ABNORMAL LOW
RBC: 3.69 — ABNORMAL LOW
RBC: 4.32
RDW: 13.4
RDW: 13.5
RDW: 14
RDW: 14.3
RDW: 14.3
RDW: 14.5
RDW: 14.7
WBC: 15.2 — ABNORMAL HIGH
WBC: 16 — ABNORMAL HIGH
WBC: 16 — ABNORMAL HIGH
WBC: 16.8 — ABNORMAL HIGH
WBC: 17.4 — ABNORMAL HIGH
WBC: 18.8 — ABNORMAL HIGH
WBC: 29.4 — ABNORMAL HIGH
WBC: 29.9 — ABNORMAL HIGH
WBC: 31.5 — ABNORMAL HIGH

## 2011-02-07 LAB — RETICULOCYTES: Retic Count, Absolute: 77.8

## 2011-02-07 LAB — CROSSMATCH

## 2011-02-07 LAB — HEMOGLOBIN AND HEMATOCRIT, BLOOD: HCT: 30.1 — ABNORMAL LOW

## 2011-02-07 LAB — CULTURE, BLOOD (ROUTINE X 2)

## 2011-02-07 LAB — URINALYSIS, ROUTINE W REFLEX MICROSCOPIC
Bilirubin Urine: NEGATIVE
Ketones, ur: 15 — AB
Leukocytes, UA: NEGATIVE
Nitrite: NEGATIVE
Nitrite: NEGATIVE
Protein, ur: 100 — AB
Specific Gravity, Urine: 1.02
Urobilinogen, UA: 0.2
Urobilinogen, UA: 0.2

## 2011-02-07 LAB — MAGNESIUM
Magnesium: 1.9
Magnesium: 2.1
Magnesium: 2.2
Magnesium: 2.2

## 2011-02-07 LAB — FOLATE: Folate: 9.1

## 2011-02-07 LAB — IRON AND TIBC
Iron: 16 — ABNORMAL LOW
Saturation Ratios: 23
TIBC: 71 — ABNORMAL LOW
UIBC: 55

## 2011-02-07 LAB — ALBUMIN
Albumin: 1.2 — ABNORMAL LOW
Albumin: 1.3 — ABNORMAL LOW

## 2011-02-07 LAB — ANAEROBIC CULTURE

## 2011-02-07 LAB — PHOSPHORUS
Phosphorus: 3.7
Phosphorus: 5.6 — ABNORMAL HIGH

## 2011-02-07 LAB — URINE CULTURE
Colony Count: NO GROWTH
Culture: NO GROWTH
Special Requests: NEGATIVE

## 2011-02-07 LAB — FERRITIN: Ferritin: 974 — ABNORMAL HIGH (ref 10–291)

## 2011-02-24 ENCOUNTER — Ambulatory Visit: Payer: Self-pay | Admitting: Vascular Surgery

## 2011-02-24 DIAGNOSIS — I10 Essential (primary) hypertension: Secondary | ICD-10-CM

## 2011-03-03 ENCOUNTER — Ambulatory Visit: Payer: Self-pay | Admitting: Vascular Surgery

## 2011-06-30 NOTE — Patient Instructions (Signed)
[address] [name]  06/30/2011   Your procedure is scheduled on:  07/10/11  Report to [name] at 08:30 AM.  Call this number if you have problems the morning of surgery: [phone]   Remember:   Do not eat food:After Midnight.  May have clear liquids:until Midnight .  Clear liquids include soda, tea, black coffee, apple or grape juice, broth.  Take these medicines the morning of surgery with A SIP OF WATER: Coreg, Hydralazine, Imdur, and Protonix. The night before your procedure, only take half of your Lanuts = 10 units.   Do not wear jewelry, make-up or nail polish.  Do not wear lotions, powders, or perfumes. You may wear deodorant.  Do not shave 48 hours prior to surgery.  Do not bring valuables to the hospital.  Contacts, dentures or bridgework may not be worn into surgery.  Leave suitcase in the car. After surgery it may be brought to your room.  For patients admitted to the hospital, checkout time is 11:00 AM the day of discharge.   Patients discharged the day of surgery will not be allowed to drive home.  Name and phone number of your driver:   Special Instructions: N/A   Please read over the following fact sheets that you were given: Anesthesia Post-op Instructions and Care and Recovery After Surgery    Cataract A cataract is a clouding of the lens of the eye. It is most often related to aging. A cataract is not a "film" over the surface of the eye. The lens is inside the eye and changes size of the pupil. The lens can enlarge to let more light enter the eye in dark environments and contract the size of the pupil to let in bright light. The lens is the part of the eye that helps focus light on the retina. The retina is the eye's light-sensitive layer. It is in the back of the eye that sends visual signals to the brain. In a normal eye, light passes through the lens and gets focused on the retina. To help produce a sharp image, the lens must remain clear. When a lens becomes  cloudy, vision is compromised by the degree and nature of the clouding. Certain cataracts make people more near-sighted as they develop, others increase glare, and all reduce vision to some degree or another. A cataract that is so dense that it becomes milky [name] and a [name] opacity can be seen through the pupil. When the [name] color is seen, it is called a "mature" or "hyper-mature cataract." Such cataracts cause total blindness in the affected eye. The cataract must be removed to prevent damage to the eye itself. Some types of cataracts can cause a secondary disease of the eye, such as certain types of glaucoma. In the early stages, better lighting and eyeglasses may lessen vision problems caused by cataracts. At a certain point, surgery may be needed to improve vision. CAUSES   Aging. However, cataracts may occur at any age, even in newborns.   Certain drugs.   Trauma to the eye.   Certain diseases (such as diabetes).   Inherited or acquired medical syndromes.  SYMPTOMS   Gradual, progressive drop in vision in the affected eye. Cataracts may develop at different rates in each eye. Cataracts may even be in just one eye with the other unaffected.   Cataracts due to trauma may develop quickly, sometimes over a matter or days or even hours. The result is severe and rapid visual loss.  DIAGNOSIS  To detect a cataract, an eye doctor examines the lens. A well developed cataract can be diagnosed without dilating the pupil. Early cataracts and others of a specific nature are best diagnosed with an exam of the eyes with the pupils dilated by drops. TREATMENT   For an early cataract, vision may improve by using different eyeglasses or stronger lighting.   If the above measures do not help, surgery is the only effective treatment. This treatment removes the cloudy lens and replaces it with a substitute lens (Intraocular lens, or IOL). Newly developed IOL technology allows the implanted lens to  improve vision both at a distance and up close. Discuss with your eye surgeon about the possibility of still needing glasses. Also discuss how visual coordination between both eyes will be affected.  A cataract needs to be removed only when vision loss interferes with your everyday activities such as driving, reading or watching TV. You and your eye doctor can make that decision together. In most cases, waiting until you are ready to have cataract surgery will not harm your eye. If you have cataracts in both eyes, only one should be removed at a time. This allows the operated eye to heal and be out of danger from serious problems (such as infection or poor wound healing) before having the other eye undergo surgery.  Sometimes, a cataract should be removed even if it does not cause problems with your vision. For example, a cataract should be removed if it prevents examination or treatment of another eye problem. Just as you cannot see out of the affected eye well, your doctor cannot see into your eye well through a cataract. The vast majority of people who have cataract surgery have better vision afterward. CATARACT REMOVAL There are two primary ways to remove a cataract. Your doctor can explain the differences and help determine which is best for you:  Phacoemulsification (small incision cataract surgery). This involves making a small cut (incision) on the edge of the clear, dome-shaped surface that covers the front of the eye (the cornea). An injection behind the eye or eye drops are given to make this a painless procedure. The doctor then inserts a tiny probe into the eye. This device emits ultrasound waves that soften and break up the cloudy center of the lens so it can be removed by suction. Most cataract surgery is done this way. The cuts are usually so small and performed in such a manner that often no sutures are needed to keep it closed.   Extracapsular surgery. Your doctor makes a slightly longer  incision on the side of the cornea. The doctor removes the hard center of the lens. The remainder of the lens is then removed by suction. In some cases, extremely fine sutures are needed which the doctor may, or may not remove in the office after the surgery.  When an IOL is implanted, it needs no care. It becomes a permanent part of your eye and cannot be seen or felt.  Some people cannot have an IOL. They may have problems during surgery, or maybe they have another eye disease. For these people, a soft contact lens may be suggested. If an IOL or contact lens cannot be used, very powerful and thick glasses are required after surgery. Since vision is very different through such thick glasses, it is important to have your doctor discuss the impact on your vision after any cataract surgery where there is no plan to implant an IOL. The  normal lens of the eye is covered by a clear capsule. Both phacoemulsification and extracapsular surgery require that the back surface of this lens capsule be left in place. This helps support IOLs and prevents the IOL from dislocating and falling back into the deeper interior of the eye. Right after surgery, and often permanently this "posterior capsule" remains clear. In some cases however, it can become cloudy, presenting the same type of visual compromise that the original cataract did since light is again obstructed as it passes through the clear IOL. This condition is often referred to as an "after-cataract." Fortunately, after-cataracts are easily treated using a painless and very fast laser treatment that is performed without anesthesia or incisions. It is done in a matter of minutes in an outpatient environment. Visual improvement is often immediate.  HOME CARE INSTRUCTIONS   Your surgeon will discuss pre and post operative care with you prior to surgery. The majority of people are able to do almost all normal activities right away. Although, it is often advised to avoid  strenuous activity for a period of time.   Postoperative drops and careful avoidance of infection will be needed. Many surgeons suggest the use of a protective shield during the first few days after surgery.   There is a very small incidence of complication from modern cataract surgery, but it can happen. Infection that spreads to the inside of the eye (endophthalmitis) can result in total visual loss and even loss of the eye itself. In extremely rare instances, the inflammation of endophthalmitis can spread to both eyes (sympathetic ophthalmia). Appropriate post-operative care under the close observation of your surgeon is essential to a successful outcome.  SEEK IMMEDIATE MEDICAL CARE IF:   You have any sudden drop of vision in the operated eye.   You have pain in the operated eye.   You see a large number of floating dots in the field of vision in the operated eye.   You see flashing lights, or if a portion of your side vision in any direction appears black (like a curtain being drawn into your field of vision) in the operated eye.  Document Released: 04/17/2005 Document Revised: 12/28/2010 Document Reviewed: 06/03/2007 [facility] Patient Information 2012 [place], [place].   PATIENT INSTRUCTIONS POST-ANESTHESIA  IMMEDIATELY FOLLOWING SURGERY:  Do not drive or operate machinery for the first twenty four hours after surgery.  Do not make any important decisions for twenty four hours after surgery or while taking narcotic pain medications or sedatives.  If you develop intractable nausea and vomiting or a severe headache please notify your doctor immediately.  FOLLOW-UP:  Please make an appointment with your surgeon as instructed. You do not need to follow up with anesthesia unless specifically instructed to do so.  WOUND CARE INSTRUCTIONS (if applicable):  Keep a dry clean dressing on the anesthesia/puncture wound site if there is drainage.  Once the wound has quit draining you may leave it  open to air.  Generally you should leave the bandage intact for twenty four hours unless there is drainage.  If the epidural site drains for more than 36-48 hours please call the anesthesia department.  QUESTIONS?:  Please feel free to call your physician or the hospital operator if you have any questions, and they will be happy to assist you.     [facility] Anesthesia Department [address] [place] [phone]

## 2011-07-03 ENCOUNTER — Encounter (HOSPITAL_COMMUNITY): Payer: Self-pay | Admitting: Pharmacy Technician

## 2011-07-03 ENCOUNTER — Encounter (HOSPITAL_COMMUNITY)
Admission: RE | Admit: 2011-07-03 | Discharge: 2011-07-03 | Disposition: A | Discharge status: Home / Self Care | Payer: Medicare Other | Source: Ambulatory Visit | Attending: Ophthalmology | Admitting: Ophthalmology

## 2011-07-03 ENCOUNTER — Encounter (HOSPITAL_COMMUNITY): Payer: Self-pay

## 2011-07-03 HISTORY — DX: Gastro-esophageal reflux disease without esophagitis: K21.9

## 2011-07-03 HISTORY — DX: Other specified disorders of the skin and subcutaneous tissue: L98.8

## 2011-07-03 HISTORY — DX: Dependence on renal dialysis: Z99.2

## 2011-07-03 NOTE — Pre-Procedure Instructions (Signed)
Pt is on dialysis Tuesday, Thursday, and Saturday so instead of drawing lab work at the pre-op interview it needs to be drawn morning of surgery 07/10/11 because her labs can obviously change a lot between now and then with 3 dialysis treatments between.

## 2011-07-07 MED ORDER — CYCLOPENTOLATE-PHENYLEPHRINE 0.2-1 % OP SOLN
OPHTHALMIC | Status: AC
  Filled 2011-07-07: qty 2

## 2011-07-07 MED ORDER — LIDOCAINE HCL (PF) 1 % IJ SOLN
INTRAMUSCULAR | Status: AC
  Filled 2011-07-07: qty 2

## 2011-07-07 MED ORDER — LIDOCAINE HCL 3.5 % OP GEL
OPHTHALMIC | Status: AC
  Filled 2011-07-07: qty 5

## 2011-07-07 MED ORDER — TETRACAINE HCL 0.5 % OP SOLN
OPHTHALMIC | Status: AC
  Filled 2011-07-07: qty 2

## 2011-07-07 MED ORDER — NEOMYCIN-POLYMYXIN-DEXAMETH 3.5-10000-0.1 OP OINT
TOPICAL_OINTMENT | OPHTHALMIC | Status: AC
  Filled 2011-07-07: qty 3.5

## 2011-07-10 ENCOUNTER — Encounter (HOSPITAL_COMMUNITY): Payer: Self-pay | Admitting: Anesthesiology

## 2011-07-10 ENCOUNTER — Encounter (HOSPITAL_COMMUNITY): Payer: Self-pay | Admitting: Ophthalmology

## 2011-07-10 ENCOUNTER — Ambulatory Visit (HOSPITAL_COMMUNITY): Payer: Medicare Other | Admitting: Anesthesiology

## 2011-07-10 ENCOUNTER — Encounter (HOSPITAL_COMMUNITY)
Admission: RE | Disposition: A | Discharge status: Home / Self Care | Payer: Self-pay | Source: Ambulatory Visit | Attending: Ophthalmology

## 2011-07-10 ENCOUNTER — Ambulatory Visit (HOSPITAL_COMMUNITY)
Admission: RE | Admit: 2011-07-10 | Discharge: 2011-07-10 | Disposition: A | Discharge status: Home / Self Care | Payer: Medicare Other | Source: Ambulatory Visit | Attending: Ophthalmology | Admitting: Ophthalmology

## 2011-07-10 DIAGNOSIS — H2589 Other age-related cataract: Secondary | ICD-10-CM | POA: Insufficient documentation

## 2011-07-10 DIAGNOSIS — E119 Type 2 diabetes mellitus without complications: Secondary | ICD-10-CM | POA: Insufficient documentation

## 2011-07-10 DIAGNOSIS — Z992 Dependence on renal dialysis: Secondary | ICD-10-CM | POA: Insufficient documentation

## 2011-07-10 DIAGNOSIS — I10 Essential (primary) hypertension: Secondary | ICD-10-CM | POA: Insufficient documentation

## 2011-07-10 DIAGNOSIS — Z01812 Encounter for preprocedural laboratory examination: Secondary | ICD-10-CM | POA: Insufficient documentation

## 2011-07-10 DIAGNOSIS — Z79899 Other long term (current) drug therapy: Secondary | ICD-10-CM | POA: Insufficient documentation

## 2011-07-10 DIAGNOSIS — N186 End stage renal disease: Secondary | ICD-10-CM | POA: Insufficient documentation

## 2011-07-10 DIAGNOSIS — Z794 Long term (current) use of insulin: Secondary | ICD-10-CM | POA: Insufficient documentation

## 2011-07-10 HISTORY — PX: CATARACT EXTRACTION W/PHACO: SHX586

## 2011-07-10 LAB — POCT I-STAT 4, (NA,K, GLUC, HGB,HCT)
Glucose, Bld: 105 mg/dL — ABNORMAL HIGH (ref 70–99)
Potassium: 5.6 mEq/L — ABNORMAL HIGH (ref 3.5–5.1)
Sodium: 137 mEq/L (ref 135–145)

## 2011-07-10 SURGERY — PHACOEMULSIFICATION, CATARACT, WITH IOL INSERTION
Anesthesia: Monitor Anesthesia Care | Site: Eye | Laterality: Left | Wound class: Clean

## 2011-07-10 MED ORDER — PHENYLEPHRINE HCL 2.5 % OP SOLN
1.0000 [drp] | OPHTHALMIC | Status: AC
Start: 2011-07-10 — End: 2011-07-10
  Administered 2011-07-10 (×3): 1 [drp] via OPHTHALMIC

## 2011-07-10 MED ORDER — TETRACAINE HCL 0.5 % OP SOLN
1.0000 [drp] | OPHTHALMIC | Status: AC
  Administered 2011-07-10 (×3): 1 [drp] via OPHTHALMIC

## 2011-07-10 MED ORDER — ONDANSETRON HCL 4 MG/2ML IJ SOLN: 4.0000 mg | Freq: Once | INTRAMUSCULAR | Status: DC | PRN

## 2011-07-10 MED ORDER — POVIDONE-IODINE 5 % OP SOLN
OPHTHALMIC | Status: DC | PRN
  Administered 2011-07-10: 1 via OPHTHALMIC

## 2011-07-10 MED ORDER — EPINEPHRINE HCL 1 MG/ML IJ SOLN
INTRAMUSCULAR | Status: AC
  Filled 2011-07-10: qty 1

## 2011-07-10 MED ORDER — MIDAZOLAM HCL 2 MG/2ML IJ SOLN
1.0000 mg | INTRAMUSCULAR | Status: DC | PRN
  Administered 2011-07-10: 2 mg via INTRAVENOUS

## 2011-07-10 MED ORDER — MIDAZOLAM HCL 2 MG/2ML IJ SOLN
INTRAMUSCULAR | Status: AC
  Administered 2011-07-10: 2 mg via INTRAVENOUS
  Filled 2011-07-10: qty 2

## 2011-07-10 MED ORDER — LIDOCAINE HCL 3.5 % OP GEL
1.0000 "application " | Freq: Once | OPHTHALMIC | Status: AC
  Administered 2011-07-10: 1 via OPHTHALMIC

## 2011-07-10 MED ORDER — PROVISC 10 MG/ML IO SOLN
INTRAOCULAR | Status: DC | PRN
  Administered 2011-07-10: .85 mL via INTRAOCULAR

## 2011-07-10 MED ORDER — BSS IO SOLN
INTRAOCULAR | Status: DC | PRN
  Administered 2011-07-10: 15 mL via INTRAOCULAR

## 2011-07-10 MED ORDER — LIDOCAINE HCL (PF) 1 % IJ SOLN
INTRAMUSCULAR | Status: DC | PRN
  Administered 2011-07-10: .4 mL

## 2011-07-10 MED ORDER — PHENYLEPHRINE HCL 2.5 % OP SOLN
OPHTHALMIC | Status: AC
  Administered 2011-07-10: 1 [drp] via OPHTHALMIC
  Filled 2011-07-10: qty 2

## 2011-07-10 MED ORDER — CYCLOPENTOLATE-PHENYLEPHRINE 0.2-1 % OP SOLN
1.0000 [drp] | OPHTHALMIC | Status: AC
  Administered 2011-07-10 (×3): 1 [drp] via OPHTHALMIC

## 2011-07-10 MED ORDER — EPINEPHRINE HCL 1 MG/ML IJ SOLN
INTRAOCULAR | Status: DC | PRN
  Administered 2011-07-10: 10:00:00

## 2011-07-10 MED ORDER — FENTANYL CITRATE 0.05 MG/ML IJ SOLN: 25.0000 ug | INTRAMUSCULAR | Status: DC | PRN

## 2011-07-10 MED ORDER — LIDOCAINE 3.5 % OP GEL OPTIME - NO CHARGE
OPHTHALMIC | Status: DC | PRN
  Administered 2011-07-10: 1 [drp] via OPHTHALMIC

## 2011-07-10 MED ORDER — NEOMYCIN-POLYMYXIN-DEXAMETH 0.1 % OP OINT
TOPICAL_OINTMENT | OPHTHALMIC | Status: DC | PRN
  Administered 2011-07-10: 1 via OPHTHALMIC

## 2011-07-10 MED ORDER — SODIUM CHLORIDE 0.9 % IV SOLN
INTRAVENOUS | Status: DC
  Administered 2011-07-10: 500 mL via INTRAVENOUS

## 2011-07-10 MED ORDER — LACTATED RINGERS IV SOLN: INTRAVENOUS | Status: DC

## 2011-07-10 SURGICAL SUPPLY — 33 items
CAPSULAR TENSION RING-AMO (OPHTHALMIC RELATED) IMPLANT
CLOTH BEACON ORANGE TIMEOUT ST (SAFETY) ×1 IMPLANT
EYE SHIELD UNIVERSAL CLEAR (GAUZE/BANDAGES/DRESSINGS) ×1 IMPLANT
GLOVE BIO SURGEON STRL SZ 6.5 (GLOVE) IMPLANT
GLOVE BIOGEL PI IND STRL 6.5 (GLOVE) IMPLANT
GLOVE BIOGEL PI IND STRL 7.0 (GLOVE) IMPLANT
GLOVE BIOGEL PI IND STRL 7.5 (GLOVE) IMPLANT
GLOVE BIOGEL PI INDICATOR 6.5 (GLOVE)
GLOVE BIOGEL PI INDICATOR 7.0 (GLOVE) ×1
GLOVE BIOGEL PI INDICATOR 7.5 (GLOVE)
GLOVE ECLIPSE 6.5 STRL STRAW (GLOVE) IMPLANT
GLOVE ECLIPSE 7.0 STRL STRAW (GLOVE) IMPLANT
GLOVE ECLIPSE 7.5 STRL STRAW (GLOVE) IMPLANT
GLOVE EXAM NITRILE LRG STRL (GLOVE) IMPLANT
GLOVE EXAM NITRILE MD LF STRL (GLOVE) ×1 IMPLANT
GLOVE SKINSENSE NS SZ6.5 (GLOVE)
GLOVE SKINSENSE NS SZ7.0 (GLOVE)
GLOVE SKINSENSE STRL SZ6.5 (GLOVE) IMPLANT
GLOVE SKINSENSE STRL SZ7.0 (GLOVE) IMPLANT
KIT VITRECTOMY (OPHTHALMIC RELATED) IMPLANT
PAD ARMBOARD 7.5X6 YLW CONV (MISCELLANEOUS) ×1 IMPLANT
PROC W NO LENS (INTRAOCULAR LENS)
PROC W SPEC LENS (INTRAOCULAR LENS)
PROCESS W NO LENS (INTRAOCULAR LENS) IMPLANT
PROCESS W SPEC LENS (INTRAOCULAR LENS) IMPLANT
RING MALYGIN (MISCELLANEOUS) IMPLANT
SIGHTPATH CAT PROC W REG LENS (Ophthalmic Related) ×2 IMPLANT
SYR TB 1ML LL NO SAFETY (SYRINGE) ×1 IMPLANT
TAPE CLOTH 1X10 TAN NS (GAUZE/BANDAGES/DRESSINGS) ×1 IMPLANT
TAPE SURG TRANSPORE 1 IN (GAUZE/BANDAGES/DRESSINGS) IMPLANT
TAPE SURGICAL TRANSPORE 1 IN (GAUZE/BANDAGES/DRESSINGS) ×1
VISCOELASTIC ADDITIONAL (OPHTHALMIC RELATED) IMPLANT
WATER STERILE IRR 250ML POUR (IV SOLUTION) ×1 IMPLANT

## 2011-07-10 NOTE — Transfer of Care (Signed)
Immediate Anesthesia Transfer of Care Note  Patient: [name]  Procedure(s) Performed: Procedure(s) (LRB): CATARACT EXTRACTION PHACO AND INTRAOCULAR LENS PLACEMENT (IOC) (Left)  Patient Location: PACU and Short Stay  Anesthesia Type: MAC  Level of Consciousness: awake, alert  and oriented  Airway & Oxygen Therapy: Patient Spontanous Breathing  Post-op Assessment: Post -op Vital signs reviewed and stable  Post vital signs: Reviewed and stable  Complications: No apparent anesthesia complications

## 2011-07-10 NOTE — Anesthesia Postprocedure Evaluation (Signed)
  Anesthesia Post-op Note  Patient: [name]  Procedure(s) Performed: Procedure(s) (LRB): CATARACT EXTRACTION PHACO AND INTRAOCULAR LENS PLACEMENT (IOC) (Left)  Patient Location: PACU and Short Stay  Anesthesia Type: MAC  Level of Consciousness: awake, alert  and oriented  Airway and Oxygen Therapy: Patient Spontanous Breathing  Post-op Pain: none  Post-op Assessment: Post-op Vital signs reviewed, Patient's Cardiovascular Status Stable, Respiratory Function Stable and Patent Airway  Post-op Vital Signs: Reviewed and stable  Complications: No apparent anesthesia complications

## 2011-07-10 NOTE — Transfer of Care (Signed)
Immediate Anesthesia Transfer of Care Note  Patient: [name]  Procedure(s) Performed: Procedure(s) (LRB): CATARACT EXTRACTION PHACO AND INTRAOCULAR LENS PLACEMENT (IOC) (Left)  Patient Location: PACU and Short Stay  Anesthesia Type: MAC  Level of Consciousness: awake, alert  and oriented  Airway & Oxygen Therapy: Patient Spontanous Breathing  Post-op Assessment: Report given to PACU RN  Post vital signs: Reviewed and stable  Complications: No apparent anesthesia complications

## 2011-07-10 NOTE — Anesthesia Preprocedure Evaluation (Signed)
Anesthesia Evaluation  Patient identified by MRN, date of birth, ID band Patient awake    Reviewed: Allergy & Precautions, H&P , NPO status , Patient's Chart, lab work & pertinent test results  History of Anesthesia Complications Negative for: history of anesthetic complications  Airway Mallampati: II      Dental  (+) Missing, Poor Dentition, Chipped and Dental Advisory Given   Pulmonary shortness of breath and with exertion,  breath sounds clear to auscultation        Cardiovascular hypertension, Pt. on medications +CHF Rhythm:Regular Rate:Normal     Neuro/Psych    GI/Hepatic GERD-  Medicated and Controlled,  Endo/Other  Diabetes mellitus-, Well Controlled, Type 2, Insulin Dependent  Renal/GU ESRF and DialysisRenal disease     Musculoskeletal   Abdominal   Peds  Hematology   Anesthesia Other Findings   Reproductive/Obstetrics                           Anesthesia Physical Anesthesia Plan  ASA: III  Anesthesia Plan: MAC   Post-op Pain Management:    Induction: Intravenous  Airway Management Planned: Oral ETT  Additional Equipment:   Intra-op Plan:   Post-operative Plan:   Informed Consent: I have reviewed the patients History and Physical, chart, labs and discussed the procedure including the risks, benefits and alternatives for the proposed anesthesia with the patient or authorized representative who has indicated his/her understanding and acceptance.     Plan Discussed with:   Anesthesia Plan Comments:         Anesthesia Quick Evaluation

## 2011-07-10 NOTE — Progress Notes (Signed)
Istat done and results given to Dr. Jayme Cloud.

## 2011-07-10 NOTE — Brief Op Note (Signed)
Pre-Op Dx: Cataract OS Post-Op Dx: Cataract OS Surgeon: Hina Gupta Anesthesia: Topical with MAC Implant: Lenstec, Model Softec HD Specimen: None Complications: None 

## 2011-07-10 NOTE — Discharge Instructions (Signed)
[name]  07/10/2011     Instructions  1. Use medications as Instructed.  Shake well before use. Wait 5 minutes between drops.  {OPHTHALMIC ANTIBIOTICS:22167} 4 times a day x 1 week.  {OPHTHALMIC ANTI-INFLAMMATORY:22168} 2 times a day x 4 weeks.  {OPHTHALMIC STEROID:22169} 4 times a day - week 1   3 times a day - Week 2, 2 times a day- Week 3, 1 time a day - Week 4.  2. Do not rub the operative eye. Do not swim underwater for 2 weeks.  3. You may remove the clear shield and resume your normal activities the day after  Surgery. Your eyes may feel more comfortable if you wear dark glasses outside.  4. Call our office at [phone] if you have sudden change in vision, extreme redness or pain. Some fluctuation in vision is normal after surgery. If you have an emergency after hours, call Dr. Alto Denver at [phone].  5. It is important that you attend all of your follow-up appointments.        Follow-up:{follow up:32580} with Gemma Payor, MD.   Dr. Lahoma Crocker: [phone]  Dr. Lita Mains: [phone]  Dr. Alto Denver: [phone]   If you find that you cannot contact your physician, but feel that your signs and   Symptoms warrant a physician's attention, call the Emergency Room at   [phone] [phone].   Other{NA AND WUXLKGMW:10272}.

## 2011-07-10 NOTE — H&P (Signed)
I have reviewed the H&P, the patient was re-examined, and I have identified no interval changes in medical condition and plan of care since the history and physical of record  

## 2011-07-11 NOTE — Op Note (Signed)
NAMEMarland Kitchen  [name], [name]                ACCOUNT NO.:  [id number]  MEDICAL RECORD NO.:  [id number]  LOCATION:  [facility]                          FACILITY:  [facility]  PHYSICIAN:  Susanne Greenhouse, MD       DATE OF BIRTH:  12/05/1945  DATE OF PROCEDURE:  07/10/2011 DATE OF DISCHARGE:  07/10/2011                              OPERATIVE REPORT   PREOPERATIVE DIAGNOSIS:  Combined cataract, left eye, diagnosis code 366.19.  POSTOPERATIVE DIAGNOSIS:  Combined cataract, left eye, diagnosis code 366.19.  OPERATION PERFORMED:  Phacoemulsification with posterior chamber intraocular lens implantation, left eye.  SURGEON:  Bonne Dolores. Dashawn Golda, MD  ANESTHESIA:  General endotracheal anesthesia.  OPERATIVE SUMMARY:  In the preoperative area, dilating drops were placed into the left eye.  The patient was then brought into the operating room where she was placed under general anesthesia.  The eye was then prepped and draped.  Beginning with a 75 blade, a paracentesis port was made at the surgeon's 2 o'clock position.  The anterior chamber was then filled with a 1% nonpreserved lidocaine solution with epinephrine.  This was followed by Viscoat to deepen the chamber.  A small fornix-based peritomy was performed superiorly.  Next, a single iris hook was placed through the limbus superiorly.  A 2.4-mm keratome blade was then used to make a clear corneal incision over the iris hook.  A bent cystotome needle and Utrata forceps were used to create a continuous tear capsulotomy.  Hydrodissection was performed using balanced salt solution on a fine cannula.  The lens nucleus was then removed using phacoemulsification in a quadrant cracking technique.  The cortical material was then removed with irrigation and aspiration.  The capsular bag and anterior chamber were refilled with Provisc.  The wound was widened to approximately 3 mm and a posterior chamber intraocular lens was placed into the capsular bag without difficulty  using an [employer] lens injecting system.  A single 10-0 nylon suture was then used to close the incision as well as stromal hydration.  The Provisc was removed from the anterior chamber and capsular bag with irrigation and aspiration.  At this point, the wounds were tested for leak, which were negative.  The anterior chamber remained deep and stable.  The patient tolerated the procedure well.  There were no operative complications, and she awoke from general anesthesia without problem.  No surgical specimens.  Prosthetic device used is a Lenstec posterior chamber lens, model Softec HD, power of 23.0, serial number is [id number].          ______________________________ Susanne Greenhouse, MD     KEH/MEDQ  D:  07/10/2011  T:  07/11/2011  Job:  [id number]

## 2011-07-12 ENCOUNTER — Encounter (HOSPITAL_COMMUNITY): Payer: Self-pay | Admitting: Ophthalmology

## 2011-08-02 ENCOUNTER — Encounter (HOSPITAL_COMMUNITY): Payer: Self-pay | Admitting: Pharmacy Technician

## 2011-08-04 ENCOUNTER — Encounter (HOSPITAL_COMMUNITY): Admission: RE | Admit: 2011-08-04 | Payer: Medicare Other | Source: Ambulatory Visit | Admitting: Ophthalmology

## 2011-08-04 MED ORDER — CYCLOPENTOLATE-PHENYLEPHRINE 0.2-1 % OP SOLN
OPHTHALMIC | Status: AC
  Filled 2011-08-04: qty 2

## 2011-08-04 MED ORDER — PHENYLEPHRINE HCL 2.5 % OP SOLN
OPHTHALMIC | Status: AC
  Filled 2011-08-04: qty 2

## 2011-08-04 MED ORDER — LIDOCAINE HCL (PF) 1 % IJ SOLN
INTRAMUSCULAR | Status: AC
  Filled 2011-08-04: qty 2

## 2011-08-04 MED ORDER — TETRACAINE HCL 0.5 % OP SOLN
OPHTHALMIC | Status: AC
  Filled 2011-08-04: qty 2

## 2011-08-04 MED ORDER — LIDOCAINE HCL 3.5 % OP GEL
OPHTHALMIC | Status: AC
  Filled 2011-08-04: qty 5

## 2011-08-07 ENCOUNTER — Ambulatory Visit (HOSPITAL_COMMUNITY): Payer: Medicare Other | Admitting: Anesthesiology

## 2011-08-07 ENCOUNTER — Encounter (HOSPITAL_COMMUNITY)
Admission: RE | Disposition: A | Discharge status: Home / Self Care | Payer: Self-pay | Source: Ambulatory Visit | Attending: Ophthalmology

## 2011-08-07 ENCOUNTER — Encounter (HOSPITAL_COMMUNITY): Payer: Self-pay | Admitting: *Deleted

## 2011-08-07 ENCOUNTER — Encounter (HOSPITAL_COMMUNITY): Payer: Self-pay | Admitting: Anesthesiology

## 2011-08-07 ENCOUNTER — Ambulatory Visit (HOSPITAL_COMMUNITY)
Admission: RE | Admit: 2011-08-07 | Discharge: 2011-08-07 | Disposition: A | Discharge status: Home / Self Care | Payer: Medicare Other | Source: Ambulatory Visit | Attending: Ophthalmology | Admitting: Ophthalmology

## 2011-08-07 DIAGNOSIS — I509 Heart failure, unspecified: Secondary | ICD-10-CM | POA: Insufficient documentation

## 2011-08-07 DIAGNOSIS — Z794 Long term (current) use of insulin: Secondary | ICD-10-CM | POA: Insufficient documentation

## 2011-08-07 DIAGNOSIS — N186 End stage renal disease: Secondary | ICD-10-CM | POA: Insufficient documentation

## 2011-08-07 DIAGNOSIS — Z992 Dependence on renal dialysis: Secondary | ICD-10-CM | POA: Insufficient documentation

## 2011-08-07 DIAGNOSIS — E119 Type 2 diabetes mellitus without complications: Secondary | ICD-10-CM | POA: Insufficient documentation

## 2011-08-07 DIAGNOSIS — I120 Hypertensive chronic kidney disease with stage 5 chronic kidney disease or end stage renal disease: Secondary | ICD-10-CM | POA: Insufficient documentation

## 2011-08-07 DIAGNOSIS — H268 Other specified cataract: Secondary | ICD-10-CM | POA: Insufficient documentation

## 2011-08-07 DIAGNOSIS — K219 Gastro-esophageal reflux disease without esophagitis: Secondary | ICD-10-CM | POA: Insufficient documentation

## 2011-08-07 HISTORY — PX: CATARACT EXTRACTION W/PHACO: SHX586

## 2011-08-07 LAB — POCT I-STAT 4, (NA,K, GLUC, HGB,HCT)
Glucose, Bld: 116 mg/dL — ABNORMAL HIGH (ref 70–99)
HCT: 30 % — ABNORMAL LOW (ref 36.0–46.0)
Potassium: 4.6 mEq/L (ref 3.5–5.1)
Sodium: 138 mEq/L (ref 135–145)

## 2011-08-07 SURGERY — PHACOEMULSIFICATION, CATARACT, WITH IOL INSERTION
Anesthesia: Monitor Anesthesia Care | Site: Eye | Laterality: Right

## 2011-08-07 SURGERY — PHACOEMULSIFICATION, CATARACT, WITH IOL INSERTION
Anesthesia: Monitor Anesthesia Care | Site: Eye | Laterality: Right | Wound class: Clean

## 2011-08-07 MED ORDER — BSS IO SOLN
INTRAOCULAR | Status: DC | PRN
  Administered 2011-08-07: 15 mL via INTRAOCULAR

## 2011-08-07 MED ORDER — NEOMYCIN-POLYMYXIN-DEXAMETH 0.1 % OP OINT
TOPICAL_OINTMENT | OPHTHALMIC | Status: DC | PRN
  Administered 2011-08-07: 1 via OPHTHALMIC

## 2011-08-07 MED ORDER — NEOMYCIN-POLYMYXIN-DEXAMETH 3.5-10000-0.1 OP OINT
TOPICAL_OINTMENT | OPHTHALMIC | Status: AC
  Filled 2011-08-07: qty 3.5

## 2011-08-07 MED ORDER — NA HYALUR & NA CHOND-NA HYALUR 0.55-0.5 ML IO KIT
PACK | INTRAOCULAR | Status: DC | PRN
  Administered 2011-08-07: 1 via OPHTHALMIC

## 2011-08-07 MED ORDER — FENTANYL CITRATE 0.05 MG/ML IJ SOLN: 25.0000 ug | INTRAMUSCULAR | Status: DC | PRN

## 2011-08-07 MED ORDER — PHENYLEPHRINE HCL 2.5 % OP SOLN
1.0000 [drp] | OPHTHALMIC | Status: AC
  Administered 2011-08-07 (×3): 1 [drp] via OPHTHALMIC

## 2011-08-07 MED ORDER — MIDAZOLAM HCL 2 MG/2ML IJ SOLN
INTRAMUSCULAR | Status: AC
  Administered 2011-08-07: 2 mg
  Filled 2011-08-07: qty 2

## 2011-08-07 MED ORDER — MIDAZOLAM HCL 2 MG/2ML IJ SOLN: 1.0000 mg | INTRAMUSCULAR | Status: DC | PRN

## 2011-08-07 MED ORDER — EPINEPHRINE HCL 1 MG/ML IJ SOLN
INTRAMUSCULAR | Status: AC
  Filled 2011-08-07: qty 1

## 2011-08-07 MED ORDER — LIDOCAINE 3.5 % OP GEL OPTIME - NO CHARGE
OPHTHALMIC | Status: DC | PRN
  Administered 2011-08-07: 1 [drp] via OPHTHALMIC

## 2011-08-07 MED ORDER — POVIDONE-IODINE 5 % OP SOLN
OPHTHALMIC | Status: DC | PRN
  Administered 2011-08-07: 1 via OPHTHALMIC

## 2011-08-07 MED ORDER — SODIUM CHLORIDE 0.9 % IV SOLN
INTRAVENOUS | Status: DC
  Administered 2011-08-07: 11:00:00 via INTRAVENOUS

## 2011-08-07 MED ORDER — LIDOCAINE HCL (PF) 1 % IJ SOLN
INTRAMUSCULAR | Status: DC | PRN
  Administered 2011-08-07: .6 mL

## 2011-08-07 MED ORDER — LIDOCAINE HCL 3.5 % OP GEL: 1.0000 "application " | Freq: Once | OPHTHALMIC | Status: DC

## 2011-08-07 MED ORDER — LACTATED RINGERS IV SOLN: INTRAVENOUS | Status: DC

## 2011-08-07 MED ORDER — EPINEPHRINE HCL 1 MG/ML IJ SOLN
INTRAOCULAR | Status: DC | PRN
  Administered 2011-08-07: 13:00:00

## 2011-08-07 MED ORDER — ONDANSETRON HCL 4 MG/2ML IJ SOLN: 4.0000 mg | Freq: Once | INTRAMUSCULAR | Status: DC | PRN

## 2011-08-07 MED ORDER — CYCLOPENTOLATE-PHENYLEPHRINE 0.2-1 % OP SOLN
1.0000 [drp] | OPHTHALMIC | Status: AC
  Administered 2011-08-07 (×3): 1 [drp] via OPHTHALMIC

## 2011-08-07 MED ORDER — TETRACAINE HCL 0.5 % OP SOLN
1.0000 [drp] | OPHTHALMIC | Status: AC
  Administered 2011-08-07 (×3): 1 [drp] via OPHTHALMIC

## 2011-08-07 SURGICAL SUPPLY — 31 items
CAPSULAR TENSION RING-AMO (OPHTHALMIC RELATED) IMPLANT
CLOTH BEACON ORANGE TIMEOUT ST (SAFETY) ×1 IMPLANT
EYE SHIELD UNIVERSAL CLEAR (GAUZE/BANDAGES/DRESSINGS) ×2 IMPLANT
GLOVE BIO SURGEON STRL SZ 6.5 (GLOVE) IMPLANT
GLOVE BIOGEL PI IND STRL 6.5 (GLOVE) IMPLANT
GLOVE BIOGEL PI IND STRL 7.0 (GLOVE) IMPLANT
GLOVE BIOGEL PI IND STRL 7.5 (GLOVE) IMPLANT
GLOVE BIOGEL PI INDICATOR 6.5 (GLOVE)
GLOVE BIOGEL PI INDICATOR 7.0 (GLOVE) ×1
GLOVE BIOGEL PI INDICATOR 7.5 (GLOVE)
GLOVE ECLIPSE 6.5 STRL STRAW (GLOVE) IMPLANT
GLOVE ECLIPSE 7.0 STRL STRAW (GLOVE) IMPLANT
GLOVE ECLIPSE 7.5 STRL STRAW (GLOVE) IMPLANT
GLOVE EXAM NITRILE LRG STRL (GLOVE) IMPLANT
GLOVE EXAM NITRILE MD LF STRL (GLOVE) ×1 IMPLANT
GLOVE SKINSENSE NS SZ6.5 (GLOVE)
GLOVE SKINSENSE NS SZ7.0 (GLOVE)
GLOVE SKINSENSE STRL SZ6.5 (GLOVE) IMPLANT
GLOVE SKINSENSE STRL SZ7.0 (GLOVE) IMPLANT
KIT VITRECTOMY (OPHTHALMIC RELATED) IMPLANT
PAD ARMBOARD 7.5X6 YLW CONV (MISCELLANEOUS) ×1 IMPLANT
PROC W NO LENS (INTRAOCULAR LENS)
PROC W SPEC LENS (INTRAOCULAR LENS)
PROCESS W NO LENS (INTRAOCULAR LENS) IMPLANT
PROCESS W SPEC LENS (INTRAOCULAR LENS) IMPLANT
RING MALYGIN (MISCELLANEOUS) IMPLANT
SIGHTPATH CAT PROC W REG LENS (Ophthalmic Related) ×2 IMPLANT
SYR TB 1ML LL NO SAFETY (SYRINGE) ×1 IMPLANT
TAPE CLOTH SOFT 2X10 (GAUZE/BANDAGES/DRESSINGS) ×1 IMPLANT
VISCOELASTIC ADDITIONAL (OPHTHALMIC RELATED) IMPLANT
WATER STERILE IRR 250ML POUR (IV SOLUTION) ×1 IMPLANT

## 2011-08-07 NOTE — Op Note (Signed)
NAMEMarland Kitchen  [name], [name]                ACCOUNT NO.:  [id number]  MEDICAL RECORD NO.:  [id number]  LOCATION:  [facility]                          FACILITY:  [facility]  PHYSICIAN:  Susanne Greenhouse, MD       DATE OF BIRTH:  [birth year]  DATE OF PROCEDURE:  08/07/2011 DATE OF DISCHARGE:  08/07/2011                              OPERATIVE REPORT   PREOPERATIVE DIAGNOSIS:  Combined cataract, right eye, diagnosis code 366.19.  POSTOPERATIVE DIAGNOSIS:  Combined cataract, right eye, diagnosis code 366.19.  OPERATION PERFORMED:  Phacoemulsification with posterior chamber intraocular lens implantation, right eye.  SURGEON:  Bonne Dolores. Alto Denver, MD  ANESTHESIA: Local with monitored anesthesia care..  OPERATIVE SUMMARY:  In the preoperative area, dilating drops were placed into the right eye.  The patient was then brought into the operating room where she was placed under general anesthesia.  The eye was then prepped and draped.  Beginning with a 75 blade, a paracentesis port was made at the surgeon's 2 o'clock position.  The anterior chamber was then filled with a 1% nonpreserved lidocaine solution with epinephrine.  This was followed by Viscoat to deepen the chamber.  A small fornix-based peritomy was performed superiorly.  Next, a single iris hook was placed through the limbus superiorly.  A 2.4-mm keratome blade was then used to make a clear corneal incision over the iris hook.  A bent cystotome needle and Utrata forceps were used to create a continuous tear capsulotomy.  Hydrodissection was performed using balanced salt solution on a fine cannula.  The lens nucleus was then removed using phacoemulsification in a quadrant cracking technique.  The cortical material was then removed with irrigation and aspiration.  The capsular bag and anterior chamber were refilled with Provisc.  The wound was widened to approximately 3 mm and a posterior chamber intraocular lens was placed into the capsular bag without  difficulty using an [employer] lens injecting system.  A single 10-0 nylon suture was then used to close the incision as well as stromal hydration.  The Provisc was removed from the anterior chamber and capsular bag with irrigation and aspiration.  At this point, the wounds were tested for leak, which were negative.  The anterior chamber remained deep and stable.  The patient tolerated the procedure well.  There were no operative complications, and she awoke from general anesthesia without problem.  No surgical specimens.  Prosthetic device used is a Lenstec posterior chamber lens, model Softec HD, power of 23.5, serial number is [id number].          ______________________________ Susanne Greenhouse, MD     KEH/MEDQ  D:  08/07/2011  T:  08/07/2011  Job:  [id number]

## 2011-08-07 NOTE — Discharge Instructions (Signed)
[name]  08/07/2011     Instructions  1. Use medications as Instructed.  Shake well before use. Wait 5 minutes between drops.  {OPHTHALMIC ANTIBIOTICS:22167} 4 times a day x 1 week.  {OPHTHALMIC ANTI-INFLAMMATORY:22168} 2 times a day x 4 weeks.  {OPHTHALMIC STEROID:22169} 4 times a day - week 1   3 times a day - Week 2, 2 times a day- Week 3, 1 time a day - Week 4.  2. Do not rub the operative eye. Do not swim underwater for 2 weeks.  3. You may remove the clear shield and resume your normal activities the day after  Surgery. Your eyes may feel more comfortable if you wear dark glasses outside.  4. Call our office at [phone] if you have sudden change in vision, extreme redness or pain. Some fluctuation in vision is normal after surgery. If you have an emergency after hours, call Dr. Alto Denver at [phone].  5. It is important that you attend all of your follow-up appointments.        Follow-up:{follow up:32580} with Gemma Payor, MD.   Dr. Lahoma Crocker: [phone]  Dr. Lita Mains: [phone]  Dr. Alto Denver: [phone]   If you find that you cannot contact your physician, but feel that your signs and   Symptoms warrant a physician's attention, call the Emergency Room at   [phone] [phone].   Other{NA AND WUXLKGMW:10272}.

## 2011-08-07 NOTE — Anesthesia Postprocedure Evaluation (Signed)
  Anesthesia Post-op Note  Patient: [name]  Procedure(s) Performed: Procedure(s) (LRB): CATARACT EXTRACTION PHACO AND INTRAOCULAR LENS PLACEMENT (IOC) (Right)  Patient Location: PACU and Short Stay  Anesthesia Type: MAC  Level of Consciousness: awake, alert , oriented and patient cooperative  Airway and Oxygen Therapy: Patient Spontanous Breathing  Post-op Pain: none  Post-op Assessment: Post-op Vital signs reviewed, Patient's Cardiovascular Status Stable, Respiratory Function Stable, Patent Airway and No signs of Nausea or vomiting  Post-op Vital Signs: Reviewed and stable  Complications: No apparent anesthesia complications

## 2011-08-07 NOTE — Anesthesia Preprocedure Evaluation (Signed)
Anesthesia Evaluation  Patient identified by MRN, date of birth, ID band Patient awake    Reviewed: Allergy & Precautions, H&P , NPO status , Patient's Chart, lab work & pertinent test results  History of Anesthesia Complications Negative for: history of anesthetic complications  Airway Mallampati: II      Dental  (+) Missing, Poor Dentition, Chipped and Dental Advisory Given   Pulmonary shortness of breath and with exertion,  breath sounds clear to auscultation        Cardiovascular hypertension, Pt. on medications +CHF Rhythm:Regular Rate:Normal     Neuro/Psych    GI/Hepatic GERD-  Medicated and Controlled,  Endo/Other  Diabetes mellitus-, Well Controlled, Type 2, Insulin Dependent  Renal/GU ESRF and DialysisRenal disease     Musculoskeletal   Abdominal   Peds  Hematology   Anesthesia Other Findings   Reproductive/Obstetrics                           Anesthesia Physical Anesthesia Plan  ASA: III  Anesthesia Plan: MAC   Post-op Pain Management:    Induction: Intravenous  Airway Management Planned: Nasal Cannula  Additional Equipment:   Intra-op Plan:   Post-operative Plan:   Informed Consent: I have reviewed the patients History and Physical, chart, labs and discussed the procedure including the risks, benefits and alternatives for the proposed anesthesia with the patient or authorized representative who has indicated his/her understanding and acceptance.     Plan Discussed with:   Anesthesia Plan Comments:         Anesthesia Quick Evaluation

## 2011-08-07 NOTE — Transfer of Care (Signed)
Immediate Anesthesia Transfer of Care Note  Patient: [name]  Procedure(s) Performed: Procedure(s) (LRB): CATARACT EXTRACTION PHACO AND INTRAOCULAR LENS PLACEMENT (IOC) (Right)  Patient Location: PACU and Short Stay  Anesthesia Type: MAC  Level of Consciousness: awake, alert , oriented and patient cooperative  Airway & Oxygen Therapy: Patient Spontanous Breathing  Post-op Assessment: Report given to PACU RN, Post -op Vital signs reviewed and stable and Patient moving all extremities  Post vital signs: Reviewed and stable  Complications: No apparent anesthesia complications

## 2011-08-07 NOTE — Brief Op Note (Signed)
Pre-Op Dx: Cataract OD Post-Op Dx: Cataract OD Surgeon: Zellie Jenning Anesthesia: Topical with MAC Implant: Lenstec, Model Softec HD Blood Loss: None Specimen: None Complications: None 

## 2011-08-07 NOTE — H&P (Signed)
I have reviewed the H&P, the patient was re-examined, and I have identified no interval changes in medical condition and plan of care since the history and physical of record  

## 2011-08-07 NOTE — Brief Op Note (Signed)
i stat done on arrival   Results to dr Marcos Eke

## 2011-08-10 ENCOUNTER — Encounter (HOSPITAL_COMMUNITY): Payer: Self-pay | Admitting: Ophthalmology

## 2011-12-12 ENCOUNTER — Other Ambulatory Visit (HOSPITAL_COMMUNITY): Payer: Medicare Other

## 2011-12-12 ENCOUNTER — Other Ambulatory Visit (HOSPITAL_COMMUNITY): Payer: Self-pay | Admitting: Nephrology

## 2011-12-12 DIAGNOSIS — M7989 Other specified soft tissue disorders: Secondary | ICD-10-CM

## 2011-12-15 ENCOUNTER — Ambulatory Visit (HOSPITAL_COMMUNITY)
Admission: RE | Admit: 2011-12-15 | Discharge: 2011-12-15 | Disposition: A | Discharge status: Home / Self Care | Payer: Medicare Other | Source: Ambulatory Visit | Attending: Nephrology | Admitting: Nephrology

## 2011-12-15 DIAGNOSIS — M7989 Other specified soft tissue disorders: Secondary | ICD-10-CM | POA: Insufficient documentation

## 2013-01-25 ENCOUNTER — Emergency Department (HOSPITAL_COMMUNITY)
Admission: EM | Admit: 2013-01-25 | Discharge: 2013-01-25 | Disposition: A | Discharge status: Home / Self Care | Payer: Medicare Other | Attending: Emergency Medicine | Admitting: Emergency Medicine

## 2013-01-25 ENCOUNTER — Encounter (HOSPITAL_COMMUNITY): Payer: Self-pay | Admitting: *Deleted

## 2013-01-25 DIAGNOSIS — Z794 Long term (current) use of insulin: Secondary | ICD-10-CM | POA: Insufficient documentation

## 2013-01-25 DIAGNOSIS — N186 End stage renal disease: Secondary | ICD-10-CM | POA: Insufficient documentation

## 2013-01-25 DIAGNOSIS — I120 Hypertensive chronic kidney disease with stage 5 chronic kidney disease or end stage renal disease: Secondary | ICD-10-CM | POA: Insufficient documentation

## 2013-01-25 DIAGNOSIS — W1809XA Striking against other object with subsequent fall, initial encounter: Secondary | ICD-10-CM | POA: Insufficient documentation

## 2013-01-25 DIAGNOSIS — Y9389 Activity, other specified: Secondary | ICD-10-CM | POA: Insufficient documentation

## 2013-01-25 DIAGNOSIS — Z992 Dependence on renal dialysis: Secondary | ICD-10-CM | POA: Insufficient documentation

## 2013-01-25 DIAGNOSIS — S0003XA Contusion of scalp, initial encounter: Secondary | ICD-10-CM | POA: Insufficient documentation

## 2013-01-25 DIAGNOSIS — Z87440 Personal history of urinary (tract) infections: Secondary | ICD-10-CM | POA: Insufficient documentation

## 2013-01-25 DIAGNOSIS — K219 Gastro-esophageal reflux disease without esophagitis: Secondary | ICD-10-CM | POA: Insufficient documentation

## 2013-01-25 DIAGNOSIS — Y929 Unspecified place or not applicable: Secondary | ICD-10-CM | POA: Insufficient documentation

## 2013-01-25 DIAGNOSIS — Z862 Personal history of diseases of the blood and blood-forming organs and certain disorders involving the immune mechanism: Secondary | ICD-10-CM | POA: Insufficient documentation

## 2013-01-25 DIAGNOSIS — E119 Type 2 diabetes mellitus without complications: Secondary | ICD-10-CM | POA: Insufficient documentation

## 2013-01-25 DIAGNOSIS — Z872 Personal history of diseases of the skin and subcutaneous tissue: Secondary | ICD-10-CM | POA: Insufficient documentation

## 2013-01-25 DIAGNOSIS — S0093XA Contusion of unspecified part of head, initial encounter: Secondary | ICD-10-CM

## 2013-01-25 NOTE — ED Notes (Signed)
Dr Knapp at bedside,  

## 2013-01-25 NOTE — ED Notes (Signed)
Pt was at dialysis today, had finished her treatment, went to get up, stumbled falling back and hitting her head against the glass door, pt denies any LOC, denies any pain, does have hematoma to back of head, pt hypertensive on arrival to er with bp 203/77. Pt has c-collar in place by ems, able to move all extremities,

## 2013-01-25 NOTE — ED Provider Notes (Signed)
CSN: [id number]     Arrival date & time 01/25/13  1110 History   First MD Initiated Contact with Patient 01/25/13 1234     Chief Complaint  Patient presents with  . Fall   (Consider location/radiation/quality/duration/timing/severity/associated sxs/prior Treatment) Patient is a 68 y.o. female presenting with fall. The history is provided by the patient and medical records.  Fall This is a new problem. The current episode started less than 1 hour ago. The problem occurs rarely. The problem has not changed since onset.Pertinent negatives include no chest pain, no headaches and no shortness of breath. Nothing aggravates the symptoms. Nothing relieves the symptoms. She has tried nothing for the symptoms.   HPI Comments: [name] is a 67 y.o. female who presents to the Emergency Department after falling. Patient reports she had finished her dialysis today and the staff was going to get her wheelchair, however she tried to get up before they came back to help her and she fell and hit the back of her head. She denies loss of consciousness. She denies having any discomfort. She denies nausea, vomiting, visual changes, numbness or weakness in her arms or legs. She is noted to have a small abrasion on her left forearm which she was not aware of. Patient has history of end-stage renal disease and gets dialysis on Tuesday, Thursday, and Saturday (today). She denies being in any pain. Marland Kitchen    PCP Dr Renard Matter   Past Medical History  Diagnosis Date  . Type 2 diabetes mellitus   . Hypercholesteremia   . Essential hypertension, benign   . UTI (urinary tract infection)   . Ruptured appendix   . Iron deficiency anemia   . Noncompliance   . Renal insufficiency     Dialysis Tues, Thurs, & Sat  . Dialysis patient     Tuesday, Thursday and Saturday   . GERD (gastroesophageal reflux disease)   . 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  Appendectomy  2008    Exploratory laparotomy  . Left carpal tunnel sugrey    . Stitch granuloma excision  2009  . Esophagogastroduodenoscopy  01/19/2011    Procedure: ESOPHAGOGASTRODUODENOSCOPY (EGD);  Surgeon: Malissa Hippo, MD;  Location: [facility];  Service: Endoscopy;  Laterality: N/A;  . Abdominal hysterectomy    . Tonsillectomy    . Cataract extraction w/phaco  07/10/2011    Procedure: CATARACT EXTRACTION PHACO AND INTRAOCULAR LENS PLACEMENT (IOC);  Surgeon: Gemma Payor, MD;  Location: [facility];  Service: Ophthalmology;  Laterality: Left;  CDE: 20.36  . Cataract extraction w/phaco  08/07/2011    Procedure: CATARACT EXTRACTION PHACO AND INTRAOCULAR LENS PLACEMENT (IOC);  Surgeon: Gemma Payor, MD;  Location: [facility];  Service: Ophthalmology;  Laterality: Right;  CDE:15.31   Family History  Problem Relation Age of Onset  . Hypertension    . Anesthesia problems Neg Hx   . Hypotension Neg Hx   . Malignant hyperthermia Neg Hx   . Pseudochol deficiency Neg Hx    History  Substance Use Topics  . Smoking status: Never Smoker   . Smokeless tobacco: Never Used  . Alcohol Use: No   Lives at home Lives with spouse and her 2 daughters  OB History   Grav Para Term Preterm Abortions TAB SAB Ect Mult Living                 Review of  Systems  Eyes: Negative for visual disturbance.  Respiratory: Negative for shortness of breath.   Cardiovascular: Negative for chest pain.  Gastrointestinal: Negative for nausea and vomiting.  Skin:       Skin tare on left arm  Neurological: Negative for syncope, weakness, numbness and headaches.  All other systems reviewed and are negative.    Allergies  Review of patient's allergies indicates no known allergies.  Home Medications   Current Outpatient Rx  Name  Route  Sig  Dispense  Refill  . EXPIRED: carvedilol (COREG) 12.5 MG tablet   Oral   Take 12.5 mg by mouth 2 (two) times daily.         Marland Kitchen EXPIRED: hydrALAZINE (APRESOLINE) 50 MG  tablet   Oral   Take 50 mg by mouth 4 (four) times daily.         . insulin glargine (LANTUS SOLOSTAR) 100 UNIT/ML injection   Subcutaneous   Inject 10 Units into the skin at bedtime.          Marland Kitchen EXPIRED: isosorbide mononitrate (IMDUR) 60 MG 24 hr tablet   Oral   Take 1 tablet (60 mg total) by mouth daily.         Marland Kitchen EXPIRED: pantoprazole (PROTONIX) 40 MG tablet   Oral   Take 40 mg by mouth daily at 12 noon.         . sevelamer (RENVELA) 800 MG tablet   Oral   Take 800 mg by mouth 3 (three) times daily with meals.          BP 207/75  Pulse 79  Temp(Src) 98.6 F (37 C) (Oral)  Resp 20  SpO2 95%  Vital signs normal except hypertension  Physical Exam  Nursing note and vitals reviewed. Constitutional: She is oriented to person, place, and time. She appears well-developed and well-nourished.  Non-toxic appearance. She does not appear ill. No distress.  HENT:  Head: Normocephalic and atraumatic.  Right Ear: External ear normal.  Left Ear: External ear normal.  Nose: Nose normal. No mucosal edema or rhinorrhea.  Mouth/Throat: Oropharynx is clear and moist and mucous membranes are normal. No dental abscesses or edematous.  Head is nontender to palpation, there is no swelling or abrasions seen.  Eyes: Conjunctivae and EOM are normal. Pupils are equal, round, and reactive to light.  Neck: Normal range of motion and full passive range of motion without pain. Neck supple.  Neck is nontender to palpation and she has free range of motion without difficulty  Cardiovascular: Normal rate, regular rhythm and normal heart sounds.  Exam reveals no gallop and no friction rub.   No murmur heard. Systolic murmur best heard in  RUSB  Pulmonary/Chest: Effort normal and breath sounds normal. No respiratory distress. She has no wheezes. She has no rhonchi. She has no rales. She exhibits no tenderness and no crepitus.  Abdominal: Soft. Normal appearance and bowel sounds are normal. She  exhibits no distension. There is no tenderness. There is no rebound and no guarding.  Musculoskeletal: Normal range of motion. She exhibits no edema and no tenderness.  Moves all extremities well.   Neurological: She is alert and oriented to person, place, and time. She has normal strength. No cranial nerve deficit.  Skin: Skin is warm, dry and intact. No rash noted. No erythema. No pallor.  Dried stool around toe nail and lower legs, nursing staff reports patient had dried stool from her waist down to her feet and legs. They report  her diaper was full of dried stool.  Psychiatric: She has a normal mood and affect. Her speech is normal and behavior is normal. Her mood appears not anxious.    ED Course  Procedures (including critical care time)  Patient has no complaints of injury from her fall. She does have multiple family members who can keep a watch on her. Nursing staff had to bathe the patient to remove the dried stool on her body. When her daughter was questioned about her last bath she states that the daughters have to bathe her and they give her a bath  2-3 times a week. They report she is able to take herself to the bathroom as needed. Nurses report the soiled diaper and dried stool was underneath her clothing.  Nursing staff called social services, Maximiano Coss, is aware of this patient, states she was last seen about 6 months ago and they were given appropriate referrals for help. They will go back to the home for another interview   Labs Review Results for orders placed during the hospital encounter of 01/25/13  GLUCOSE, CAPILLARY      Result Value Range   Glucose-Capillary 96  70 - 99 mg/dL   Laboratory interpretation all normal   MDM   1. Contusion of head, initial encounter      Plan discharge   Devoria Albe, MD, Franz Dell, MD 01/25/13 [phone]

## 2013-01-25 NOTE — ED Notes (Signed)
Pt given paper scrubs for discharge, abrasion to left forearm area cleaned, pt tolerated well, pt and spouse expressed understanding of discharge instructions,

## 2013-01-25 NOTE — ED Notes (Signed)
Family states patient is diabetic and needs something to eat. RN made aware.

## 2013-01-25 NOTE — ED Notes (Addendum)
Spoke with [facility] Shon Baton, who was notified of concerns with pt being taken care of. Advised that DSS would make a report.

## 2013-01-25 NOTE — ED Notes (Signed)
Pt changed into hospital gown, pt had a diaper soaked with urine, dried stool, pt had dried stool in abd folds, groin area, vaginal area, bilateral legs and feet. Denies any problems with diarrhea. States that her family helps to take care of her, skin noted to be broken, red, flaking in groin area. Denies any pain. Pt cleaned with soap, warm water and skin cleanser, tolerated well, bed sheets changed,

## 2013-04-15 ENCOUNTER — Emergency Department (HOSPITAL_COMMUNITY)
Admission: EM | Admit: 2013-04-15 | Discharge: 2013-04-15 | Disposition: A | Discharge status: Home / Self Care | Payer: Medicare Other | Attending: Emergency Medicine | Admitting: Emergency Medicine

## 2013-04-15 ENCOUNTER — Encounter (HOSPITAL_COMMUNITY): Payer: Self-pay | Admitting: Emergency Medicine

## 2013-04-15 DIAGNOSIS — S0003XA Contusion of scalp, initial encounter: Secondary | ICD-10-CM | POA: Insufficient documentation

## 2013-04-15 DIAGNOSIS — Y9389 Activity, other specified: Secondary | ICD-10-CM | POA: Insufficient documentation

## 2013-04-15 DIAGNOSIS — S0993XA Unspecified injury of face, initial encounter: Secondary | ICD-10-CM

## 2013-04-15 DIAGNOSIS — Z9119 Patient's noncompliance with other medical treatment and regimen: Secondary | ICD-10-CM | POA: Insufficient documentation

## 2013-04-15 DIAGNOSIS — Z862 Personal history of diseases of the blood and blood-forming organs and certain disorders involving the immune mechanism: Secondary | ICD-10-CM | POA: Insufficient documentation

## 2013-04-15 DIAGNOSIS — X500XXA Overexertion from strenuous movement or load, initial encounter: Secondary | ICD-10-CM | POA: Insufficient documentation

## 2013-04-15 DIAGNOSIS — K219 Gastro-esophageal reflux disease without esophagitis: Secondary | ICD-10-CM | POA: Insufficient documentation

## 2013-04-15 DIAGNOSIS — Z91199 Patient's noncompliance with other medical treatment and regimen due to unspecified reason: Secondary | ICD-10-CM | POA: Insufficient documentation

## 2013-04-15 DIAGNOSIS — Y929 Unspecified place or not applicable: Secondary | ICD-10-CM | POA: Insufficient documentation

## 2013-04-15 DIAGNOSIS — I120 Hypertensive chronic kidney disease with stage 5 chronic kidney disease or end stage renal disease: Secondary | ICD-10-CM | POA: Insufficient documentation

## 2013-04-15 DIAGNOSIS — R011 Cardiac murmur, unspecified: Secondary | ICD-10-CM | POA: Insufficient documentation

## 2013-04-15 DIAGNOSIS — Z87440 Personal history of urinary (tract) infections: Secondary | ICD-10-CM | POA: Insufficient documentation

## 2013-04-15 DIAGNOSIS — E7800 Pure hypercholesterolemia, unspecified: Secondary | ICD-10-CM | POA: Insufficient documentation

## 2013-04-15 DIAGNOSIS — Z794 Long term (current) use of insulin: Secondary | ICD-10-CM | POA: Insufficient documentation

## 2013-04-15 DIAGNOSIS — E119 Type 2 diabetes mellitus without complications: Secondary | ICD-10-CM | POA: Insufficient documentation

## 2013-04-15 DIAGNOSIS — Z992 Dependence on renal dialysis: Secondary | ICD-10-CM | POA: Insufficient documentation

## 2013-04-15 DIAGNOSIS — N186 End stage renal disease: Secondary | ICD-10-CM | POA: Insufficient documentation

## 2013-04-15 DIAGNOSIS — W1809XA Striking against other object with subsequent fall, initial encounter: Secondary | ICD-10-CM | POA: Insufficient documentation

## 2013-04-15 DIAGNOSIS — Z79899 Other long term (current) drug therapy: Secondary | ICD-10-CM | POA: Insufficient documentation

## 2013-04-15 LAB — COMPREHENSIVE METABOLIC PANEL
Alkaline Phosphatase: 125 U/L — ABNORMAL HIGH (ref 39–117)
BUN: 29 mg/dL — ABNORMAL HIGH (ref 6–23)
CO2: 26 mEq/L (ref 19–32)
Calcium: 8.4 mg/dL (ref 8.4–10.5)
GFR calc Af Amer: 17 mL/min — ABNORMAL LOW (ref >90)
GFR calc non Af Amer: 15 mL/min — ABNORMAL LOW (ref >90)
Glucose, Bld: 107 mg/dL — ABNORMAL HIGH (ref 70–99)
Total Protein: 6.8 g/dL (ref 6.0–8.3)

## 2013-04-15 LAB — CBC WITH DIFFERENTIAL/PLATELET
Eosinophils Absolute: 0 10*3/uL (ref 0.0–0.7)
Eosinophils Relative: 1 % (ref 0–5)
HCT: 30.9 % — ABNORMAL LOW (ref 36.0–46.0)
Hemoglobin: 10.4 g/dL — ABNORMAL LOW (ref 12.0–15.0)
Lymphocytes Relative: 23 % (ref 12–46)
Lymphs Abs: 1.1 10*3/uL (ref 0.7–4.0)
MCH: 30.6 pg (ref 26.0–34.0)
MCV: 90.9 fL (ref 78.0–100.0)
Monocytes Relative: 10 % (ref 3–12)
RBC: 3.4 MIL/uL — ABNORMAL LOW (ref 3.87–5.11)

## 2013-04-15 LAB — APTT: aPTT: 35 seconds (ref 24–37)

## 2013-04-15 NOTE — ED Notes (Signed)
Pt states she rolled over in bed and hit left side of face on nightstand. Bruising noted. Denies loc.

## 2013-04-15 NOTE — ED Provider Notes (Signed)
CSN: [id number]     Arrival date & time 04/15/13  1345 History  This chart was scribed for Gerhard Munch, MD by Luisa Dago, ED Scribe. This patient was seen in room APA01/APA01 and the patient's care was started at 3:25 PM.    Chief Complaint  Patient presents with  . Bleeding/Bruising   The history is provided by the patient and a relative. No language interpreter was used.   HPI Comments: LEONARDO PLAIA is a 67 y.o. female who presents to the Emergency Department complaining of an injury to the left side of her face that happened 3 days ago . Pt states that she rolled off the bed the wrong way and hit the left side of her face on the night stand. The daughters state that she was scheduled for dialysis today and they noticed that the bruising on her face was getting darker so the nurses there advised them to come to the ED to get the injury checked out. They state that the swelling has reduced from the day of injury, because they have been applying ICE to the are. Pt is not on any anticoagulants. Pt denies syncope, neck pain, vomiting, weakness, facial swelling, confusion, and visual changes.  Past Medical History  Diagnosis Date  . Type 2 diabetes mellitus   . Hypercholesteremia   . Essential hypertension, benign   . UTI (urinary tract infection)   . Ruptured appendix   . Iron deficiency anemia   . Noncompliance   . Renal insufficiency     Dialysis Tues, Thurs, & Sat  . Dialysis patient     Tuesday, Thursday and Saturday   . GERD (gastroesophageal reflux disease)   . 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 Appendectomy  2008    Exploratory laparotomy  . Left carpal tunnel sugrey    . Stitch granuloma excision  2009  . Esophagogastroduodenoscopy  01/19/2011    Procedure: ESOPHAGOGASTRODUODENOSCOPY (EGD);  Surgeon: Malissa Hippo, MD;  Location: [facility];  Service: Endoscopy;  Laterality: N/A;  . Abdominal  hysterectomy    . Tonsillectomy    . Cataract extraction w/phaco  07/10/2011    Procedure: CATARACT EXTRACTION PHACO AND INTRAOCULAR LENS PLACEMENT (IOC);  Surgeon: Gemma Payor, MD;  Location: [facility];  Service: Ophthalmology;  Laterality: Left;  CDE: 20.36  . Cataract extraction w/phaco  08/07/2011    Procedure: CATARACT EXTRACTION PHACO AND INTRAOCULAR LENS PLACEMENT (IOC);  Surgeon: Gemma Payor, MD;  Location: [facility];  Service: Ophthalmology;  Laterality: Right;  CDE:15.31   Family History  Problem Relation Age of Onset  . Hypertension    . Anesthesia problems Neg Hx   . Hypotension Neg Hx   . Malignant hyperthermia Neg Hx   . Pseudochol deficiency Neg Hx    History  Substance Use Topics  . Smoking status: Never Smoker   . Smokeless tobacco: Never Used  . Alcohol Use: No   OB History   Grav Para Term Preterm Abortions TAB SAB Ect Mult Living                 Review of Systems  HENT: Negative for facial swelling.   Eyes: Negative for visual disturbance.  Gastrointestinal: Negative for vomiting.  Musculoskeletal: Negative for neck pain.  Skin: Positive for wound (left side of her face).  Neurological: Negative for syncope, weakness and numbness.  Psychiatric/Behavioral: Negative for confusion.  All other systems reviewed and are negative.    Allergies  Review of patient's allergies indicates no known allergies.  Home Medications   Current Outpatient Rx  Name  Route  Sig  Dispense  Refill  . EXPIRED: carvedilol (COREG) 12.5 MG tablet   Oral   Take 12.5 mg by mouth 2 (two) times daily.         Marland Kitchen EXPIRED: hydrALAZINE (APRESOLINE) 50 MG tablet   Oral   Take 50 mg by mouth 4 (four) times daily.         . insulin glargine (LANTUS SOLOSTAR) 100 UNIT/ML injection   Subcutaneous   Inject 10 Units into the skin at bedtime.          Marland Kitchen EXPIRED: isosorbide mononitrate (IMDUR) 60 MG 24 hr tablet   Oral   Take 1 tablet (60 mg total) by mouth daily.         Marland Kitchen EXPIRED:  pantoprazole (PROTONIX) 40 MG tablet   Oral   Take 40 mg by mouth daily at 12 noon.         . sevelamer (RENVELA) 800 MG tablet   Oral   Take 800 mg by mouth 3 (three) times daily with meals.          Triage vitals:BP 159/74  Pulse 63  Temp(Src) 98.7 F (37.1 C) (Oral)  Resp 12  SpO2 97%  Physical Exam  Nursing note and vitals reviewed. Constitutional: She appears well-developed and well-nourished. No distress.  HENT:  Head: Normocephalic.  No facial deformities. Facial bones are stable. No tenderness, she's got ecchymotic changes on the left cheek, neck, temporal area  Eyes: Conjunctivae and EOM are normal. Right eye exhibits no discharge. Left eye exhibits no discharge.  Neck: Neck supple.  Lower ecchymotic changes to the left lateral interior neck. No deformities.   Cardiovascular: Normal rate.   Murmur heard. Pulmonary/Chest: Effort normal and breath sounds normal. No respiratory distress.  Abdominal: Soft. She exhibits no distension. There is no tenderness.  Musculoskeletal: She exhibits no edema and no tenderness.  No tenderness to scapula or left shoulder.  Neurological: She is alert.  Skin: Skin is warm and dry.  Psychiatric: She has a normal mood and affect. Her behavior is normal. Thought content normal.    ED Course  Procedures (including critical care time)  DIAGNOSTIC STUDIES: Oxygen Saturation is 97% on room air, adequate by my interpretation.    COORDINATION OF CARE: 3:27 PM-Discussed treatment plan which includes CBC with pt at bedside and pt agreed to plan.   Labs Review Labs Reviewed - No data to display Imaging Review No results found.  EKG Interpretation   None      Update: Patient in no distress She is aware of all results.  MDM  No diagnosis found.  I personally performed the services described in this documentation, which was scribed in my presence. The recorded information has been reviewed and is accurate.   Patient  presents several days after minor trauma to her face.  On exam she awake alert, denying any complaints.  She has no facial asymmetry, and throughout the past days has developed no neurologic concerns.  Thus, imaging is not indicated.  Patient's labs are reassuring, with no evidence of thrombus the pain appeared patient was discharged in stable condition with primary care followup as needed.  Gerhard Munch, MD 04/15/13 1700

## 2013-08-02 ENCOUNTER — Emergency Department (HOSPITAL_COMMUNITY)
Admission: EM | Admit: 2013-08-02 | Discharge: 2013-08-02 | Disposition: A | Discharge status: Home / Self Care | Payer: Medicare Other | Attending: Emergency Medicine | Admitting: Emergency Medicine

## 2013-08-02 ENCOUNTER — Encounter (HOSPITAL_COMMUNITY): Payer: Self-pay | Admitting: Emergency Medicine

## 2013-08-02 DIAGNOSIS — Z91199 Patient's noncompliance with other medical treatment and regimen due to unspecified reason: Secondary | ICD-10-CM | POA: Insufficient documentation

## 2013-08-02 DIAGNOSIS — R112 Nausea with vomiting, unspecified: Secondary | ICD-10-CM | POA: Insufficient documentation

## 2013-08-02 DIAGNOSIS — Z87440 Personal history of urinary (tract) infections: Secondary | ICD-10-CM | POA: Insufficient documentation

## 2013-08-02 DIAGNOSIS — I10 Essential (primary) hypertension: Secondary | ICD-10-CM

## 2013-08-02 DIAGNOSIS — Z862 Personal history of diseases of the blood and blood-forming organs and certain disorders involving the immune mechanism: Secondary | ICD-10-CM | POA: Insufficient documentation

## 2013-08-02 DIAGNOSIS — N186 End stage renal disease: Secondary | ICD-10-CM | POA: Insufficient documentation

## 2013-08-02 DIAGNOSIS — K219 Gastro-esophageal reflux disease without esophagitis: Secondary | ICD-10-CM | POA: Insufficient documentation

## 2013-08-02 DIAGNOSIS — Z794 Long term (current) use of insulin: Secondary | ICD-10-CM | POA: Insufficient documentation

## 2013-08-02 DIAGNOSIS — Z79899 Other long term (current) drug therapy: Secondary | ICD-10-CM | POA: Insufficient documentation

## 2013-08-02 DIAGNOSIS — I120 Hypertensive chronic kidney disease with stage 5 chronic kidney disease or end stage renal disease: Secondary | ICD-10-CM | POA: Insufficient documentation

## 2013-08-02 DIAGNOSIS — Z992 Dependence on renal dialysis: Secondary | ICD-10-CM | POA: Insufficient documentation

## 2013-08-02 DIAGNOSIS — E119 Type 2 diabetes mellitus without complications: Secondary | ICD-10-CM | POA: Insufficient documentation

## 2013-08-02 DIAGNOSIS — Z9119 Patient's noncompliance with other medical treatment and regimen: Secondary | ICD-10-CM | POA: Insufficient documentation

## 2013-08-02 DIAGNOSIS — F29 Unspecified psychosis not due to a substance or known physiological condition: Secondary | ICD-10-CM | POA: Insufficient documentation

## 2013-08-02 NOTE — ED Notes (Signed)
Per family at bedside, pt is "normal", Dr Rubin PayorPickering in to speak with daughters, family also report that pt takes her blood pressure medication at lunch,

## 2013-08-02 NOTE — Discharge Instructions (Signed)

## 2013-08-02 NOTE — ED Provider Notes (Signed)
CSN: [id number]     Arrival date & time 08/02/13  1025 History  This chart was scribed for [employer]. Rubin Payor, MD,  by Ashley Jacobs, ED Scribe. The patient was seen in room APA07/APA07 and the patient's care was started at 10:40 AM.    First MD Initiated Contact with Patient 08/02/13 1035     Chief Complaint  Patient presents with  . Hypertension     (Consider location/radiation/quality/duration/timing/severity/associated sxs/prior Treatment) Patient is a 68 y.o. female presenting with hypertension. The history is provided by the patient, medical records, a relative, the EMS personnel and a caregiver. No language interpreter was used.  Hypertension Pertinent negatives include no chest pain and no shortness of breath.   HPI Comments: CHARLESIA CANADAY is a 68 y.o. female who presents to the Emergency Department via EMS complaining of HTN, onset this morning while at dialysis. The staff at the center reports that she "seemed confused". She was given 0.2 mg of clonidine by the staff at the dialysis center. They report that she completed the full dialysis treatment. Per EMS she had a BP reading of 226/92, and 190/74.Pt mentions that she was nauseated and confused and the symptoms have decreased upon arrival to the ED.  Reports having breakfast this morning. Deneis headache, SOB and chest pain.  Denies dysuria. Denies any pain. Denies numbness and weakness.  Past Medical History  Diagnosis Date  . Type 2 diabetes mellitus   . Hypercholesteremia   . Essential hypertension, benign   . UTI (urinary tract infection)   . Ruptured appendix   . Iron deficiency anemia   . Noncompliance   . Renal insufficiency     Dialysis Tues, Thurs, & Sat  . Dialysis patient     Tuesday, Thursday and Saturday   . GERD (gastroesophageal reflux disease)   . 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 Appendectomy  2008    Exploratory laparotomy  .  Left carpal tunnel sugrey    . Stitch granuloma excision  2009  . Esophagogastroduodenoscopy  01/19/2011    Procedure: ESOPHAGOGASTRODUODENOSCOPY (EGD);  Surgeon: Malissa Hippo, MD;  Location: [facility];  Service: Endoscopy;  Laterality: N/A;  . Abdominal hysterectomy    . Tonsillectomy    . Cataract extraction w/phaco  07/10/2011    Procedure: CATARACT EXTRACTION PHACO AND INTRAOCULAR LENS PLACEMENT (IOC);  Surgeon: Gemma Payor, MD;  Location: [facility];  Service: Ophthalmology;  Laterality: Left;  CDE: 20.36  . Cataract extraction w/phaco  08/07/2011    Procedure: CATARACT EXTRACTION PHACO AND INTRAOCULAR LENS PLACEMENT (IOC);  Surgeon: Gemma Payor, MD;  Location: [facility];  Service: Ophthalmology;  Laterality: Right;  CDE:15.31   Family History  Problem Relation Age of Onset  . Hypertension    . Anesthesia problems Neg Hx   . Hypotension Neg Hx   . Malignant hyperthermia Neg Hx   . Pseudochol deficiency Neg Hx    History  Substance Use Topics  . Smoking status: Never Smoker   . Smokeless tobacco: Never Used  . Alcohol Use: No   OB History   Grav Para Term Preterm Abortions TAB SAB Ect Mult Living                 Review of Systems  Respiratory: Negative for shortness of breath.   Cardiovascular: Negative for chest pain.       HTN  Gastrointestinal: Positive  for nausea and vomiting.  Neurological: Negative for weakness and numbness.  All other systems reviewed and are negative.      Allergies  Review of patient's allergies indicates no known allergies.  Home Medications   Current Outpatient Rx  Name  Route  Sig  Dispense  Refill  . EXPIRED: carvedilol (COREG) 12.5 MG tablet   Oral   Take 12.5 mg by mouth 2 (two) times daily.         Marland Kitchen doxercalciferol (HECTOROL) 0.5 MCG capsule   Oral   Take 0.5 mcg by mouth 3 (three) times a week.         Marland Kitchen Epoetin Alfa (EPOGEN IJ)   Injection   Inject 33,000 Units as directed 3 (three) times a week.         Marland Kitchen EXPIRED:  hydrALAZINE (APRESOLINE) 50 MG tablet   Oral   Take 50 mg by mouth 4 (four) times daily.         . insulin glargine (LANTUS SOLOSTAR) 100 UNIT/ML injection   Subcutaneous   Inject 5-10 Units into the skin at bedtime. Only given for elevated sugar levels         . IRON PO   Oral   Take 50 mg by mouth once a week.         Marland Kitchen EXPIRED: isosorbide mononitrate (IMDUR) 60 MG 24 hr tablet   Oral   Take 1 tablet (60 mg total) by mouth daily.         . meclizine (ANTIVERT) 12.5 MG tablet   Oral   Take 12.5 mg by mouth every 4 (four) hours as needed for dizziness.         Marland Kitchen EXPIRED: pantoprazole (PROTONIX) 40 MG tablet   Oral   Take 40 mg by mouth daily at 12 noon.         . sevelamer (RENVELA) 800 MG tablet   Oral   Take 800-1,600 mg by mouth 3 (three) times daily with meals.           BP 171/79  Pulse 69  Temp(Src) 98.4 F (36.9 C) (Oral)  Ht 5' (1.524 m)  Wt 126 lb 12.2 oz (57.499 kg)  BMI 24.76 kg/m2  SpO2 95% Physical Exam  Nursing note and vitals reviewed. Constitutional: She appears well-developed and well-nourished. No distress.  HENT:  Head: Normocephalic and atraumatic.  Mouth/Throat: Oropharynx is clear and moist. No oropharyngeal exudate.  Eyes: Pupils are equal, round, and reactive to light.  Neck: Normal range of motion.  Cardiovascular: Exam reveals no gallop and no friction rub.   No murmur heard. Pulmonary/Chest: Effort normal and breath sounds normal. No respiratory distress. She has no wheezes. She has no rales.  Abdominal: Soft. There is no rebound and no guarding.  Musculoskeletal: Normal range of motion. She exhibits no edema and no tenderness.  Fistula to right medial arm   Neurological: She is alert.  Appears to be mildly confused  Skin: Skin is warm and dry. No rash noted. She is not diaphoretic.  Psychiatric: She has a normal mood and affect. Her behavior is normal.    ED Course  Procedures (including critical care  time) DIAGNOSTIC STUDIES: Oxygen Saturation is 95% on room air, normal by my interpretation.    COORDINATION OF CARE:  10:43 AM Discussed course of care with pt . Pt understands and agrees.    Labs Review Labs Reviewed - No data to display Imaging Review No results found.   EKG  Interpretation None      MDM   Final diagnoses:  Hypertension  End stage renal failure on dialysis    The patient was sent from dialysis center with hypertension there. Was 230/90. Given 0.2 clonidine. Patient's blood pressure here is 179/89. Patient denies confusion, headache or chest pain. No dysuria. Patient's family is here and states she is at her baseline. Will discharge home to followup with her primary care doctors. Doubt severe intracranial damage at this time. Doubt cardiac ischemia.  I personally performed the services described in this documentation, which was scribed in my presence. The recorded information has been reviewed and is accurate.    Juliet RudeNathan R. Rubin PayorPickering, MD 08/02/13 1100

## 2013-08-02 NOTE — ED Notes (Addendum)
Pt transported to er [facility] from dialysis  Center for further evaluation of elevated blood pressure, per ems staff readings at dialysis center and with ems were 226/92, 190/74, pt was given 0.2 mg clonidine by dialysis center staff with no improvement in blood pressure, staff at dialysis also concerned because pt "not acting right", on ems arrival to er, pt able to state name, birthday, address, name of family members living with her, pt is slow to answer questions at times, pt denies any pain, bp on arrival to er 171/79, CBG 179

## 2013-08-02 NOTE — ED Notes (Signed)
Dr Rubin PayorPickering aware of blood pressure, states " pt is able to go home, have daughter's given pt her medication at lunch per normal routine for them", message given to daughters at bedside,

## 2013-10-31 ENCOUNTER — Emergency Department (HOSPITAL_COMMUNITY)
Admission: EM | Admit: 2013-10-31 | Discharge: 2013-10-31 | Disposition: A | Discharge status: Home / Self Care | Payer: Medicare Other | Attending: Emergency Medicine | Admitting: Emergency Medicine

## 2013-10-31 ENCOUNTER — Emergency Department (HOSPITAL_COMMUNITY): Payer: Medicare Other

## 2013-10-31 ENCOUNTER — Encounter (HOSPITAL_COMMUNITY): Payer: Self-pay | Admitting: Emergency Medicine

## 2013-10-31 DIAGNOSIS — E119 Type 2 diabetes mellitus without complications: Secondary | ICD-10-CM | POA: Insufficient documentation

## 2013-10-31 DIAGNOSIS — Z87440 Personal history of urinary (tract) infections: Secondary | ICD-10-CM | POA: Insufficient documentation

## 2013-10-31 DIAGNOSIS — Z992 Dependence on renal dialysis: Secondary | ICD-10-CM | POA: Insufficient documentation

## 2013-10-31 DIAGNOSIS — K219 Gastro-esophageal reflux disease without esophagitis: Secondary | ICD-10-CM | POA: Insufficient documentation

## 2013-10-31 DIAGNOSIS — Y9389 Activity, other specified: Secondary | ICD-10-CM | POA: Insufficient documentation

## 2013-10-31 DIAGNOSIS — S0990XA Unspecified injury of head, initial encounter: Secondary | ICD-10-CM

## 2013-10-31 DIAGNOSIS — Z872 Personal history of diseases of the skin and subcutaneous tissue: Secondary | ICD-10-CM | POA: Insufficient documentation

## 2013-10-31 DIAGNOSIS — I129 Hypertensive chronic kidney disease with stage 1 through stage 4 chronic kidney disease, or unspecified chronic kidney disease: Secondary | ICD-10-CM | POA: Insufficient documentation

## 2013-10-31 DIAGNOSIS — S0510XA Contusion of eyeball and orbital tissues, unspecified eye, initial encounter: Secondary | ICD-10-CM | POA: Insufficient documentation

## 2013-10-31 DIAGNOSIS — Z794 Long term (current) use of insulin: Secondary | ICD-10-CM | POA: Insufficient documentation

## 2013-10-31 DIAGNOSIS — Z862 Personal history of diseases of the blood and blood-forming organs and certain disorders involving the immune mechanism: Secondary | ICD-10-CM | POA: Insufficient documentation

## 2013-10-31 DIAGNOSIS — N186 End stage renal disease: Secondary | ICD-10-CM | POA: Insufficient documentation

## 2013-10-31 DIAGNOSIS — W1809XA Striking against other object with subsequent fall, initial encounter: Secondary | ICD-10-CM | POA: Insufficient documentation

## 2013-10-31 DIAGNOSIS — Z79899 Other long term (current) drug therapy: Secondary | ICD-10-CM | POA: Insufficient documentation

## 2013-10-31 DIAGNOSIS — Y929 Unspecified place or not applicable: Secondary | ICD-10-CM | POA: Insufficient documentation

## 2013-10-31 DIAGNOSIS — S0010XA Contusion of unspecified eyelid and periocular area, initial encounter: Secondary | ICD-10-CM

## 2013-10-31 NOTE — ED Provider Notes (Signed)
CSN: [id number]     Arrival date & time 10/31/13  [id number] History   First MD Initiated Contact with Patient 10/31/13 1039   This chart was scribed for [name] Shelby Ramdass, MD by Gwenevere AbbotAlexis Brown, ED scribe. This patient was seen in room APA19/APA19 and the patient's care was started at 10:40 AM.    Chief Complaint  Patient presents with  . Fall   Patient is a 68 y.o. female presenting with fall. The history is provided by the patient and a relative. No language interpreter was used.  Fall This is a new problem. The current episode started less than 1 hour ago. Pertinent negatives include no chest pain, no abdominal pain, no headaches and no shortness of breath.   HPI Comments:  [name] is a 68 y.o. female who presents to the Emergency Department complaining of a fall that occurred after attempting to get out of bed. Pt reports that while trying to ambulate, she fell forwards and hit her head on the night stand. Pt generally uses a walker to ambulate. Pt denies dizziness, or that she passed out before or after incident. Pt denies CP, SOB, neck or back pain. Pt denies headache, vision change. Pt receives dialysis treatment 3 times a week, with her next  appointment scheduled for tomorrow.  Pt denies taking blood thinners.   Pt denies LOC from fall  Past Medical History  Diagnosis Date  . Type 2 diabetes mellitus   . Hypercholesteremia   . Essential hypertension, benign   . UTI (urinary tract infection)   . Ruptured appendix   . Iron deficiency anemia   . Noncompliance   . Renal insufficiency     Dialysis Tues, Thurs, & Sat  . Dialysis patient     Tuesday, Thursday and Saturday   . GERD (gastroesophageal reflux disease)   . 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 Appendectomy  2008    Exploratory laparotomy  . Left carpal tunnel sugrey    . Stitch granuloma excision  2009  . Esophagogastroduodenoscopy  01/19/2011     Procedure: ESOPHAGOGASTRODUODENOSCOPY (EGD);  Surgeon: Malissa HippoNajeeb U Rehman, MD;  Location: [facility];  Service: Endoscopy;  Laterality: N/A;  . Abdominal hysterectomy    . Tonsillectomy    . Cataract extraction Foster/phaco  07/10/2011    Procedure: CATARACT EXTRACTION PHACO AND INTRAOCULAR LENS PLACEMENT (IOC);  Surgeon: Gemma PayorKerry Hunt, MD;  Location: [facility];  Service: Ophthalmology;  Laterality: Left;  CDE: 20.36  . Cataract extraction Foster/phaco  08/07/2011    Procedure: CATARACT EXTRACTION PHACO AND INTRAOCULAR LENS PLACEMENT (IOC);  Surgeon: Gemma PayorKerry Hunt, MD;  Location: [facility];  Service: Ophthalmology;  Laterality: Right;  CDE:15.31   Family History  Problem Relation Age of Onset  . Hypertension    . Anesthesia problems Neg Hx   . Hypotension Neg Hx   . Malignant hyperthermia Neg Hx   . Pseudochol deficiency Neg Hx    History  Substance Use Topics  . Smoking status: Never Smoker   . Smokeless tobacco: Never Used  . Alcohol Use: No   OB History   Grav Para Term Preterm Abortions TAB SAB Ect Mult Living                 Review of Systems  Eyes: Negative for visual disturbance.  Respiratory: Negative for shortness of breath.   Cardiovascular: Negative for chest pain.  Gastrointestinal: Negative for abdominal pain.  Musculoskeletal: Negative for back pain and neck pain.  Neurological: Negative for dizziness, syncope and headaches.  All other systems reviewed and are negative.     Allergies  Review of patient's allergies indicates no known allergies.  Home Medications   Prior to Admission medications   Medication Sig Start Date End Date Taking? Authorizing Provider  carvedilol (COREG) 12.5 MG tablet Take 12.5 mg by mouth 2 (two) times daily. 01/24/11 04/15/13  Alice Reichert, MD  doxercalciferol (HECTOROL) 0.5 MCG capsule Take 0.5 mcg by mouth 3 (three) times a week.    Historical Provider, MD  Epoetin Alfa (EPOGEN IJ) Inject 33,000 Units as directed 3 (three) times a week.     Historical Provider, MD  hydrALAZINE (APRESOLINE) 50 MG tablet Take 50 mg by mouth 4 (four) times daily. 01/24/11 04/15/13  Angus Edilia Bo, MD  insulin glargine (LANTUS SOLOSTAR) 100 UNIT/ML injection Inject 5-10 Units into the skin at bedtime. Only given for elevated sugar levels    Historical Provider, MD  IRON PO Take 50 mg by mouth once a week.    Historical Provider, MD  isosorbide mononitrate (IMDUR) 60 MG 24 hr tablet Take 1 tablet (60 mg total) by mouth daily. 01/24/11 04/15/13  Alice Reichert, MD  meclizine (ANTIVERT) 12.5 MG tablet Take 12.5 mg by mouth every 4 (four) hours as needed for dizziness.    Historical Provider, MD  pantoprazole (PROTONIX) 40 MG tablet Take 40 mg by mouth daily at 12 noon. 01/24/11 04/15/13  Alice Reichert, MD  sevelamer (RENVELA) 800 MG tablet Take 800-1,600 mg by mouth 3 (three) times daily with meals.     Historical Provider, MD   BP 219/89  Pulse 78  Temp(Src) 98.8 F (37.1 C) (Oral)  Resp 18  Ht 5\' 4"  (1.626 m)  Wt 130 lb (58.968 kg)  BMI 22.30 kg/m2  SpO2 96% Physical Exam CONSTITUTIONAL: Well developed/well nourished HEAD: bilateral periorbital echimosis  EYES: EOMI/PERRL, no hyphema, no subconjunctival hemmorage  ENMT: Mucous membranes moist, no nasal or dental injury noted, face is stable, no septal hematoma NECK: supple no meningeal signs SPINE:entire spine nontender CV: S1/S2 noted, no murmurs/rubs/gallops noted LUNGS: Lungs are clear to auscultation bilaterally, no apparent distress ABDOMEN: soft, nontender, no rebound or guarding GU:no cva tenderness NEURO: Pt is awake/alert, moves all extremitiesx4 EXTREMITIES: pulses normal, full ROM, dialysis fistula,with thrill noted to left arm  No signs of trauma noted to extremities SKIN: warm, color normal PSYCH: no abnormalities of mood noted   ED Course  Procedures (including critical care time) 10:44 AM Patient and Family informed of clinical course, understand medical decision-making  process, and agree with plan.  Ct Head Wo Contrast  10/31/2013   CLINICAL DATA:  Recent traumatic injury with pain  EXAM: CT HEAD AND ORBITS WITHOUT CONTRAST  TECHNIQUE: Contiguous axial images were obtained from the base of the skull through the vertex without contrast. Multidetector CT imaging of the orbits was performed using the standard protocol without intravenous contrast.  COMPARISON:  01/31/2011  FINDINGS: CT HEAD FINDINGS  The bony calvarium is intact. Mild atrophic changes are seen. Chronic white matter ischemic changes noted. No findings to suggest acute hemorrhage, acute infarction or space-occupying mass lesion are noted.  CT ORBITS FINDINGS  Mild soft tissue swelling is noted in the frontal region related to the recent injury. The orbits and their contents are within normal limits bilaterally. No acute fracture is noted. Paranasal sinuses are within  normal limits. The ostiomeatal complexes are unremarkable.  IMPRESSION: CT of the head: No acute intracranial abnormality is noted. Chronic changes are seen.  CT of the orbits: Mild frontal soft tissue swelling is noted. No other focal soft tissue or bony abnormality is seen.   Electronically Signed   By: Alcide CleverMark  Lukens M.D.   On: 10/31/2013 11:14   Ct Orbitss Foster/o Cm  10/31/2013   CLINICAL DATA:  Recent traumatic injury with pain  EXAM: CT HEAD AND ORBITS WITHOUT CONTRAST  TECHNIQUE: Contiguous axial images were obtained from the base of the skull through the vertex without contrast. Multidetector CT imaging of the orbits was performed using the standard protocol without intravenous contrast.  COMPARISON:  01/31/2011  FINDINGS: CT HEAD FINDINGS  The bony calvarium is intact. Mild atrophic changes are seen. Chronic white matter ischemic changes noted. No findings to suggest acute hemorrhage, acute infarction or space-occupying mass lesion are noted.  CT ORBITS FINDINGS  Mild soft tissue swelling is noted in the frontal region related to the recent injury.  The orbits and their contents are within normal limits bilaterally. No acute fracture is noted. Paranasal sinuses are within normal limits. The ostiomeatal complexes are unremarkable.  IMPRESSION: CT of the head: No acute intracranial abnormality is noted. Chronic changes are seen.  CT of the orbits: Mild frontal soft tissue swelling is noted. No other focal soft tissue or bony abnormality is seen.   Electronically Signed   By: Alcide CleverMark  Lukens M.D.   On: 10/31/2013 11:14    Ct imaging negative Pt has no complaints No signs of orbital/globe injury Stable for d/c home  MDM   Final diagnoses:  Periorbital ecchymosis, unspecified laterality, initial encounter  Minor head injury without loss of consciousness, initial encounter    Nursing notes including past medical history and social history reviewed and considered in documentation  I personally performed the services described in this documentation, which was scribed in my presence. The recorded information has been reviewed and is accurate.        [name] Baraa Tubbs, MD 10/31/13 1535

## 2013-10-31 NOTE — Discharge Instructions (Signed)
Concussion °A concussion is a brain injury. It is caused by: °· A hit to the head. °· A quick and sudden movement (jolt) of the head or neck. °A concussion is usually not life-threatening. Even so, it can cause serious problems. If you had a concussion before, you may have concussion-like problems after a hit to your head. °HOME CARE °General Instructions °· Follow your doctor's directions carefully. °· Take medicines only as told by your doctor. °· Only take medicines your doctor says are safe. °· Do not drink alcohol until your doctor says it is okay. Alcohol and some drugs can slow down healing. They can also put you at risk for further injury. °· If you are having trouble remembering things, write them down. °· Try to do one thing at a time if you get distracted easily. For example, do not watch TV while making dinner. °· Talk to your family members or close friends when making important decisions. °· Follow up with your doctor as told. °· Watch your symptoms. Tell others to do the same. Serious problems can sometimes happen after a concussion. Older adults are more likely to have these problems. °· Tell your teachers, school nurse, school counselor, coach, athletic trainer, or work manager about your concussion. Tell them about what you can or cannot do. They should watch to see if: °¨ It gets even harder for you to pay attention or concentrate. °¨ It gets even harder for you to remember things or learn new things. °¨ You need more time than normal to finish things. °¨ You become annoyed (irritable) more than before. °¨ You are not able to deal with stress as well. °¨ You have more problems than before. °· Rest. Make sure you: °¨ Get plenty of sleep at night. °¨ Go to sleep early. °¨ Go to bed at the same time every day. Try to wake up at the same time. °¨ Rest during the day. °¨ Take naps when you feel tired. °· Limit activities where you have to think a lot or concentrate. These include: °¨ Doing  homework. °¨ Doing work related to a job. °¨ Watching TV. °¨ Using the computer. °Returning To Your Regular Activities °Return to your normal activities slowly, not all at once. You must give your body and brain enough time to heal.  °· Do not play sports or do other athletic activities until your doctor says it is okay. °· Ask your doctor when you can drive, ride a bicycle, or work other vehicles or machines. Never do these things if you feel dizzy. °· Ask your doctor about when you can return to work or school. °Preventing Another Concussion °It is very important to avoid another brain injury, especially before you have healed. In rare cases, another injury can lead to permanent brain damage, brain swelling, or death. The risk of this is greatest during the first 7-10 days after your injury. Avoid injuries by:  °· Wearing a seat belt when riding in a car. °· Not drinking too much alcohol. °· Avoiding activities that could lead to a second concussion (such as contact sports). °· Wearing a helmet when doing activities like: °¨ Biking. °¨ Skiing. °¨ Skateboarding. °¨ Skating. °· Making your home safer by: °¨ Removing things from the floor or stairways that could make you trip. °¨ Using grab bars in bathrooms and handrails by stairs. °¨ Placing non-slip mats on floors and in bathtubs. °¨ Improve lighting in dark areas. °GET HELP IF: °· It gets   even harder for you to pay attention or concentrate. °· It gets even harder for you to remember things or learn new things. °· You need more time than normal to finish things. °· You become annoyed (irritable) more than before. °· You are not able to deal with stress as well. °· You have more problems than before. °· You have problems keeping your balance. °· You are not able to react quickly when you should. °Get help if you have any of these problems for more than 2 weeks:  °· Lasting (chronic) headaches. °· Dizziness or trouble balancing. °· Feeling sick to your stomach  (nausea). °· Seeing (vision) problems. °· Being affected by noises or light more than normal. °· Feeling sad, low, down in the dumps, blue, gloomy, or empty (depressed). °· Mood changes (mood swings). °· Feeling of fear or nervousness about what may happen (anxiety). °· Feeling annoyed. °· Memory problems. °· Problems concentrating or paying attention. °· Sleep problems. °· Feeling tired all the time. °GET HELP RIGHT AWAY IF:  °· You have bad headaches or your headaches get worse. °· You have weakness (even if it is in one hand, leg, or part of the face). °· You have loss of feeling (numbness). °· You feel off balance. °· You keep throwing up (vomiting). °· You feel tired. °· One black center of your eye (pupil) is larger than the other. °· You twitch or shake violently (convulse). °· Your speech is not clear (slurred). °· You are more confused, easily angered (agitated), or annoyed than before. °· You have more trouble resting than before. °· You are unable to recognize people or places. °· You have neck pain. °· It is difficult to wake you up. °· You have unusual behavior changes. °· You pass out (lose consciousness). °MAKE SURE YOU:  °· Understand these instructions. °· Will watch your condition. °· Will get help right away if you are not doing well or get worse. °Document Released: 04/05/2009 Document Revised: 04/22/2013 Document Reviewed: 11/07/2012 °ExitCare® Patient Information ©2015 [employer]. This information is not intended to replace advice given to you by your health care provider. Make sure you discuss any questions you have with your health care provider. ° °

## 2013-10-31 NOTE — ED Notes (Signed)
Fell this morning.  Hit head on a night stand.

## 2014-02-01 ENCOUNTER — Inpatient Hospital Stay (HOSPITAL_COMMUNITY)
Admission: EM | Admit: 2014-02-01 | Discharge: 2014-02-06 | DRG: 602 | Disposition: A | Discharge status: Home / Self Care | Payer: Medicare Other | Attending: Family Medicine | Admitting: Family Medicine

## 2014-02-01 ENCOUNTER — Emergency Department (HOSPITAL_COMMUNITY): Payer: Medicare Other

## 2014-02-01 ENCOUNTER — Encounter (HOSPITAL_COMMUNITY): Payer: Self-pay | Admitting: Emergency Medicine

## 2014-02-01 DIAGNOSIS — L03116 Cellulitis of left lower limb: Secondary | ICD-10-CM | POA: Diagnosis present

## 2014-02-01 DIAGNOSIS — I120 Hypertensive chronic kidney disease with stage 5 chronic kidney disease or end stage renal disease: Secondary | ICD-10-CM | POA: Diagnosis present

## 2014-02-01 DIAGNOSIS — E0865 Diabetes mellitus due to underlying condition with hyperglycemia: Secondary | ICD-10-CM

## 2014-02-01 DIAGNOSIS — E876 Hypokalemia: Secondary | ICD-10-CM | POA: Diagnosis present

## 2014-02-01 DIAGNOSIS — L97929 Non-pressure chronic ulcer of unspecified part of left lower leg with unspecified severity: Secondary | ICD-10-CM | POA: Diagnosis present

## 2014-02-01 DIAGNOSIS — I5042 Chronic combined systolic (congestive) and diastolic (congestive) heart failure: Secondary | ICD-10-CM | POA: Diagnosis present

## 2014-02-01 DIAGNOSIS — F0390 Unspecified dementia without behavioral disturbance: Secondary | ICD-10-CM | POA: Diagnosis present

## 2014-02-01 DIAGNOSIS — Z79899 Other long term (current) drug therapy: Secondary | ICD-10-CM

## 2014-02-01 DIAGNOSIS — T798XXA Other early complications of trauma, initial encounter: Secondary | ICD-10-CM

## 2014-02-01 DIAGNOSIS — K219 Gastro-esophageal reflux disease without esophagitis: Secondary | ICD-10-CM | POA: Diagnosis present

## 2014-02-01 DIAGNOSIS — D649 Anemia, unspecified: Secondary | ICD-10-CM | POA: Diagnosis present

## 2014-02-01 DIAGNOSIS — Z992 Dependence on renal dialysis: Secondary | ICD-10-CM | POA: Diagnosis not present

## 2014-02-01 DIAGNOSIS — E1140 Type 2 diabetes mellitus with diabetic neuropathy, unspecified: Secondary | ICD-10-CM | POA: Diagnosis present

## 2014-02-01 DIAGNOSIS — Z8249 Family history of ischemic heart disease and other diseases of the circulatory system: Secondary | ICD-10-CM

## 2014-02-01 DIAGNOSIS — N186 End stage renal disease: Secondary | ICD-10-CM | POA: Diagnosis present

## 2014-02-01 DIAGNOSIS — Z993 Dependence on wheelchair: Secondary | ICD-10-CM

## 2014-02-01 DIAGNOSIS — E871 Hypo-osmolality and hyponatremia: Secondary | ICD-10-CM | POA: Diagnosis present

## 2014-02-01 DIAGNOSIS — E11622 Type 2 diabetes mellitus with other skin ulcer: Secondary | ICD-10-CM | POA: Diagnosis present

## 2014-02-01 DIAGNOSIS — Z794 Long term (current) use of insulin: Secondary | ICD-10-CM | POA: Diagnosis not present

## 2014-02-01 DIAGNOSIS — L089 Local infection of the skin and subcutaneous tissue, unspecified: Secondary | ICD-10-CM | POA: Diagnosis present

## 2014-02-01 DIAGNOSIS — L0390 Cellulitis, unspecified: Secondary | ICD-10-CM | POA: Diagnosis present

## 2014-02-01 DIAGNOSIS — E780 Pure hypercholesterolemia: Secondary | ICD-10-CM | POA: Diagnosis present

## 2014-02-01 DIAGNOSIS — M79605 Pain in left leg: Secondary | ICD-10-CM | POA: Diagnosis present

## 2014-02-01 DIAGNOSIS — T148XXA Other injury of unspecified body region, initial encounter: Secondary | ICD-10-CM

## 2014-02-01 LAB — GLUCOSE, CAPILLARY
GLUCOSE-CAPILLARY: 180 mg/dL — AB (ref 70–99)
Glucose-Capillary: 83 mg/dL (ref 70–99)

## 2014-02-01 LAB — COMPREHENSIVE METABOLIC PANEL
ALBUMIN: 3.7 g/dL (ref 3.5–5.2)
ALT: 11 U/L (ref 0–35)
AST: 18 U/L (ref 0–37)
Alkaline Phosphatase: 92 U/L (ref 39–117)
Anion gap: 14 (ref 5–15)
BUN: 32 mg/dL — AB (ref 6–23)
CALCIUM: 9 mg/dL (ref 8.4–10.5)
CO2: 29 mEq/L (ref 19–32)
CREATININE: 3.72 mg/dL — AB (ref 0.50–1.10)
Chloride: 96 mEq/L (ref 96–112)
GFR calc Af Amer: 13 mL/min — ABNORMAL LOW (ref >90)
GFR calc non Af Amer: 12 mL/min — ABNORMAL LOW (ref >90)
Glucose, Bld: 95 mg/dL (ref 70–99)
Potassium: 4 mEq/L (ref 3.7–5.3)
Sodium: 139 mEq/L (ref 137–147)
Total Bilirubin: 0.6 mg/dL (ref 0.3–1.2)
Total Protein: 7.6 g/dL (ref 6.0–8.3)

## 2014-02-01 LAB — CBC WITH DIFFERENTIAL/PLATELET
BASOS ABS: 0 10*3/uL (ref 0.0–0.1)
Basophils Relative: 0 % (ref 0–1)
EOS PCT: 1 % (ref 0–5)
Eosinophils Absolute: 0 10*3/uL (ref 0.0–0.7)
HEMATOCRIT: 36.2 % (ref 36.0–46.0)
Hemoglobin: 12.1 g/dL (ref 12.0–15.0)
LYMPHS ABS: 1.1 10*3/uL (ref 0.7–4.0)
LYMPHS PCT: 16 % (ref 12–46)
MCH: 31.3 pg (ref 26.0–34.0)
MCHC: 33.4 g/dL (ref 30.0–36.0)
MCV: 93.8 fL (ref 78.0–100.0)
MONO ABS: 0.7 10*3/uL (ref 0.1–1.0)
Monocytes Relative: 11 % (ref 3–12)
Neutro Abs: 4.9 10*3/uL (ref 1.7–7.7)
Neutrophils Relative %: 72 % (ref 43–77)
Platelets: 322 10*3/uL (ref 150–400)
RBC: 3.86 MIL/uL — ABNORMAL LOW (ref 3.87–5.11)
RDW: 13.7 % (ref 11.5–15.5)
WBC: 6.8 10*3/uL (ref 4.0–10.5)

## 2014-02-01 LAB — MRSA PCR SCREENING: MRSA by PCR: NEGATIVE

## 2014-02-01 LAB — SEDIMENTATION RATE: Sed Rate: 41 mm/hr — ABNORMAL HIGH (ref 0–22)

## 2014-02-01 LAB — I-STAT CG4 LACTIC ACID, ED: Lactic Acid, Venous: 0.66 mmol/L (ref 0.5–2.2)

## 2014-02-01 MED ORDER — ONDANSETRON HCL 4 MG PO TABS: 4.0000 mg | ORAL_TABLET | Freq: Four times a day (QID) | ORAL | Status: DC | PRN

## 2014-02-01 MED ORDER — ISOSORBIDE MONONITRATE ER 60 MG PO TB24
60.0000 mg | ORAL_TABLET | Freq: Every day | ORAL | Status: DC
  Administered 2014-02-01 – 2014-02-06 (×6): 60 mg via ORAL
  Filled 2014-02-01 (×8): qty 1

## 2014-02-01 MED ORDER — HYDRALAZINE HCL 25 MG PO TABS
50.0000 mg | ORAL_TABLET | Freq: Four times a day (QID) | ORAL | Status: DC
Start: 2014-02-01 — End: 2014-02-07
  Administered 2014-02-01 – 2014-02-06 (×13): 50 mg via ORAL
  Filled 2014-02-01 (×9): qty 2
  Filled 2014-02-01: qty 1
  Filled 2014-02-01 (×4): qty 2
  Filled 2014-02-01: qty 1
  Filled 2014-02-01: qty 2
  Filled 2014-02-01: qty 1
  Filled 2014-02-01: qty 2
  Filled 2014-02-01: qty 1
  Filled 2014-02-01 (×2): qty 2
  Filled 2014-02-01: qty 1
  Filled 2014-02-01: qty 2

## 2014-02-01 MED ORDER — SODIUM CHLORIDE 0.9 % IV SOLN
Freq: Once | INTRAVENOUS | Status: AC
  Administered 2014-02-01: 10:00:00 via INTRAVENOUS

## 2014-02-01 MED ORDER — INSULIN ASPART 100 UNIT/ML ~~LOC~~ SOLN
0.0000 [IU] | Freq: Three times a day (TID) | SUBCUTANEOUS | Status: DC
  Administered 2014-02-02: 3 [IU] via SUBCUTANEOUS
  Administered 2014-02-03 – 2014-02-04 (×4): 2 [IU] via SUBCUTANEOUS
  Administered 2014-02-05: 3 [IU] via SUBCUTANEOUS
  Administered 2014-02-05: 2 [IU] via SUBCUTANEOUS

## 2014-02-01 MED ORDER — PIPERACILLIN-TAZOBACTAM 3.375 G IVPB 30 MIN
3.3750 g | Freq: Once | INTRAVENOUS | Status: AC
  Administered 2014-02-01: 3.375 g via INTRAVENOUS
  Filled 2014-02-01: qty 50

## 2014-02-01 MED ORDER — CARVEDILOL 12.5 MG PO TABS
12.5000 mg | ORAL_TABLET | Freq: Two times a day (BID) | ORAL | Status: DC
  Administered 2014-02-01 – 2014-02-06 (×12): 12.5 mg via ORAL
  Filled 2014-02-01 (×15): qty 1

## 2014-02-01 MED ORDER — INSULIN ASPART 100 UNIT/ML ~~LOC~~ SOLN: 0.0000 [IU] | Freq: Every day | SUBCUTANEOUS | Status: DC

## 2014-02-01 MED ORDER — DOXERCALCIFEROL 0.5 MCG PO CAPS
0.5000 ug | ORAL_CAPSULE | ORAL | Status: DC
  Administered 2014-02-02 – 2014-02-06 (×3): 0.5 ug via ORAL
  Filled 2014-02-01 (×4): qty 1

## 2014-02-01 MED ORDER — PANTOPRAZOLE SODIUM 40 MG PO TBEC
40.0000 mg | DELAYED_RELEASE_TABLET | Freq: Every day | ORAL | Status: DC
Start: 2014-02-01 — End: 2014-02-07
  Administered 2014-02-01 – 2014-02-06 (×5): 40 mg via ORAL
  Filled 2014-02-01 (×5): qty 1

## 2014-02-01 MED ORDER — HEPARIN SODIUM (PORCINE) 5000 UNIT/ML IJ SOLN
5000.0000 [IU] | Freq: Three times a day (TID) | INTRAMUSCULAR | Status: DC
  Administered 2014-02-01 – 2014-02-06 (×15): 5000 [IU] via SUBCUTANEOUS
  Filled 2014-02-01 (×15): qty 1

## 2014-02-01 MED ORDER — ACETAMINOPHEN 650 MG RE SUPP: 650.0000 mg | Freq: Four times a day (QID) | RECTAL | Status: DC | PRN

## 2014-02-01 MED ORDER — SEVELAMER CARBONATE 800 MG PO TABS
800.0000 mg | ORAL_TABLET | Freq: Three times a day (TID) | ORAL | Status: DC
  Administered 2014-02-01 – 2014-02-03 (×5): 800 mg via ORAL
  Filled 2014-02-01 (×5): qty 1

## 2014-02-01 MED ORDER — MECLIZINE HCL 12.5 MG PO TABS: 12.5000 mg | ORAL_TABLET | ORAL | Status: DC | PRN

## 2014-02-01 MED ORDER — MORPHINE SULFATE 2 MG/ML IJ SOLN: 2.0000 mg | INTRAMUSCULAR | Status: DC | PRN

## 2014-02-01 MED ORDER — ONDANSETRON HCL 4 MG/2ML IJ SOLN: 4.0000 mg | Freq: Four times a day (QID) | INTRAMUSCULAR | Status: DC | PRN

## 2014-02-01 MED ORDER — PIPERACILLIN-TAZOBACTAM IN DEX 2-0.25 GM/50ML IV SOLN
2.2500 g | Freq: Three times a day (TID) | INTRAVENOUS | Status: DC
  Administered 2014-02-01 – 2014-02-06 (×14): 2.25 g via INTRAVENOUS
  Filled 2014-02-01 (×24): qty 50

## 2014-02-01 MED ORDER — VANCOMYCIN HCL IN DEXTROSE 1-5 GM/200ML-% IV SOLN
1000.0000 mg | Freq: Once | INTRAVENOUS | Status: AC
  Administered 2014-02-01: 1000 mg via INTRAVENOUS
  Filled 2014-02-01: qty 200

## 2014-02-01 MED ORDER — ACETAMINOPHEN 325 MG PO TABS: 650.0000 mg | ORAL_TABLET | Freq: Four times a day (QID) | ORAL | Status: DC | PRN

## 2014-02-01 NOTE — ED Notes (Signed)
PT has necrotic wound to left lower extremity with foul odor 1-2 weeks per husband and was given a 7 day course of bactrim on 01/20/14 by dialysis MD. PT denies any pain at this time. ER MD at bedside at this time.

## 2014-02-01 NOTE — ED Provider Notes (Signed)
CSN: [id number]     Arrival date & time 02/01/14  8469 History   First MD Initiated Contact with Patient 02/01/14 [phone]     Chief Complaint  Patient presents with  . Wound Check     (Consider location/radiation/quality/duration/timing/severity/associated sxs/prior Treatment) HPI   [name] is a 68 y.o. female who has h/o DM and is a dialysis patient, presents to the Emergency Department with her spouse, whom both are poor historians, complaining of a non-healing wound for "several days".  The patient believes she struck her leg on something, but she is not completely sure how or when her symptoms began.  Her husband reports noticing a foul odor and states the wound is not healing. Patient states that her kidney doctor prescribed her an antibiotic and she has finished the medication without improvement.  The antibiotic taken was Bactrim DS twice daily for 7 days prescribed on 01/20/14.  Patient denies pain, fever, chills, vomiting, swelling or numbness of her leg. Nothing makes her symptoms better or worse.   Past Medical History  Diagnosis Date  . Type 2 diabetes mellitus   . Hypercholesteremia   . Essential hypertension, benign   . UTI (urinary tract infection)   . Ruptured appendix   . Iron deficiency anemia   . Noncompliance   . Renal insufficiency     Dialysis Tues, Thurs, & Sat  . Dialysis patient     Tuesday, Thursday and Saturday   . GERD (gastroesophageal reflux disease)   . 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 Appendectomy  2008    Exploratory laparotomy  . Left carpal tunnel sugrey    . Stitch granuloma excision  2009  . Esophagogastroduodenoscopy  01/19/2011    Procedure: ESOPHAGOGASTRODUODENOSCOPY (EGD);  Surgeon: Malissa Hippo, MD;  Location: [facility];  Service: Endoscopy;  Laterality: N/A;  . Abdominal hysterectomy    . Tonsillectomy    . Cataract extraction w/phaco  07/10/2011   Procedure: CATARACT EXTRACTION PHACO AND INTRAOCULAR LENS PLACEMENT (IOC);  Surgeon: Gemma Payor, MD;  Location: [facility];  Service: Ophthalmology;  Laterality: Left;  CDE: 20.36  . Cataract extraction w/phaco  08/07/2011    Procedure: CATARACT EXTRACTION PHACO AND INTRAOCULAR LENS PLACEMENT (IOC);  Surgeon: Gemma Payor, MD;  Location: [facility];  Service: Ophthalmology;  Laterality: Right;  CDE:15.31   Family History  Problem Relation Age of Onset  . Hypertension    . Anesthesia problems Neg Hx   . Hypotension Neg Hx   . Malignant hyperthermia Neg Hx   . Pseudochol deficiency Neg Hx    History  Substance Use Topics  . Smoking status: Never Smoker   . Smokeless tobacco: Never Used  . Alcohol Use: No   OB History   Grav Para Term Preterm Abortions TAB SAB Ect Mult Living                 Review of Systems  Constitutional: Negative for fever, chills, activity change and appetite change.  Respiratory: Negative for chest tightness and shortness of breath.   Cardiovascular: Negative for chest pain.  Gastrointestinal: Negative for nausea and vomiting.  Genitourinary: Negative for dysuria and difficulty urinating.  Musculoskeletal: Negative for arthralgias, back pain, joint swelling and myalgias.  Skin: Positive for color change and wound.       Open wound to the left lower leg  Neurological: Negative for dizziness, syncope, weakness  and numbness.  All other systems reviewed and are negative.     Allergies  Review of patient's allergies indicates no known allergies.  Home Medications   Prior to Admission medications   Medication Sig Start Date End Date Taking? Authorizing Provider  carvedilol (COREG) 12.5 MG tablet Take 12.5 mg by mouth 2 (two) times daily. 01/24/11 04/15/13  Alice Reichert, MD  doxercalciferol (HECTOROL) 0.5 MCG capsule Take 0.5 mcg by mouth 3 (three) times a week.    Historical Provider, MD  Epoetin Alfa (EPOGEN IJ) Inject 33,000 Units as directed 3 (three) times a  week.    Historical Provider, MD  hydrALAZINE (APRESOLINE) 50 MG tablet Take 50 mg by mouth 4 (four) times daily. 01/24/11 04/15/13  Angus Edilia Bo, MD  insulin glargine (LANTUS SOLOSTAR) 100 UNIT/ML injection Inject 5-10 Units into the skin at bedtime. Only given for elevated sugar levels    Historical Provider, MD  IRON PO Take 50 mg by mouth once a week.    Historical Provider, MD  isosorbide mononitrate (IMDUR) 60 MG 24 hr tablet Take 1 tablet (60 mg total) by mouth daily. 01/24/11 04/15/13  Alice Reichert, MD  meclizine (ANTIVERT) 12.5 MG tablet Take 12.5 mg by mouth every 4 (four) hours as needed for dizziness.    Historical Provider, MD  pantoprazole (PROTONIX) 40 MG tablet Take 40 mg by mouth daily at 12 noon. 01/24/11 04/15/13  Alice Reichert, MD  sevelamer (RENVELA) 800 MG tablet Take 800-1,600 mg by mouth 3 (three) times daily with meals.     Historical Provider, MD  sulfamethoxazole-trimethoprim (BACTRIM DS) 800-160 MG per tablet Take 1 tablet by mouth daily. Starting 01/20/2014 x 7 days.    Historical Provider, MD   BP 177/66  Pulse 68  Temp(Src) 98.7 F (37.1 C) (Oral)  Resp 18  SpO2 98% Physical Exam  Nursing note and vitals reviewed. Constitutional: She is oriented to person, place, and time. No distress.  Frail appearing  HENT:  Head: Normocephalic and atraumatic.  Neck: Normal range of motion. Neck supple.  Cardiovascular: Normal rate and intact distal pulses.   Murmur heard. Pulmonary/Chest: Effort normal and breath sounds normal. No respiratory distress.  Abdominal: Soft. She exhibits no distension. There is no tenderness. There is no rebound and no guarding.  Musculoskeletal: She exhibits no tenderness.  Compartments of the LLE are soft.    Neurological: She is alert and oriented to person, place, and time. She exhibits normal muscle tone. Coordination normal.  Palpable DP and PT pulses   Skin: Skin is warm. There is erythema.  Malodorous, open 5 cm wound to the  medial left lower leg, just proximal to the ankle.  Localized STS and mild to moderate surround erythema.  Old appearing coagulated blood present around the wound.      ED Course  Procedures (including critical care time) Labs Review Labs Reviewed  CBC WITH DIFFERENTIAL - Abnormal; Notable for the following:    RBC 3.86 (*)    All other components within normal limits  COMPREHENSIVE METABOLIC PANEL - Abnormal; Notable for the following:    BUN 32 (*)    Creatinine, Ser 3.72 (*)    GFR calc non Af Amer 12 (*)    GFR calc Af Amer 13 (*)    All other components within normal limits  SEDIMENTATION RATE - Abnormal; Notable for the following:    Sed Rate 41 (*)    All other components within normal limits  CULTURE,  BLOOD (ROUTINE X 2)  CULTURE, BLOOD (ROUTINE X 2)  MRSA PCR SCREENING  I-STAT CG4 LACTIC ACID, ED    Imaging Review Dg Tibia/fibula Left  02/01/2014   CLINICAL DATA:  worsening wound to her left lower medial leg. She has treated the wound with bactrim without resolution; the bactrim was prescribed 9/22 and finished 9/30. Her husband reports the wound is associated with malodor and discharge. No known cause of the wound. No known injury. Hx diabetes, HTN, Pt is on dialysis  EXAM: LEFT TIBIA AND FIBULA - 2 VIEW  COMPARISON:  None.  FINDINGS: There is no evidence of fracture or other focal bone lesions. Soft tissue defect at the posteromedial aspect of the distal tibial shaft. No subcutaneous gas. No focal cortical destruction. No radiodense foreign body. Extensive arterial calcifications.  IMPRESSION: Soft tissue defect without bone abnormality.   Electronically Signed   By: Oley Balmaniel  Hassell M.D.   On: 02/01/2014 11:31     EKG Interpretation None      MDM   Final diagnoses:  Wound infection, initial encounter  Cellulitis of left lower extremity    Patient with infectious appearing wound to the LLE, failed outpatient therapy with Bactrim.  Patient also seen by Dr. Jodi MourningZavitz,  care plan discussed.  Plan includes labs, XR and IV antibiotics ordered.  Will likely admit.  Patient is frail appearing but non-toxic.  Bandages on the wound appeared dirty and old.  Patient nor her husband could tell me when the dressing was last changed.     Blood cultures pending, patient receiving vancomycin and Zosyn IV.   1316  consult hospitalist, Dr.Chiu will admit patient to Med-Surg for wound infection.  Masson Nalepa L. Trisha Mangleriplett, PA-C 02/01/14 1646

## 2014-02-01 NOTE — H&P (Signed)
[facility] History and Physical  KENYON ESHLEMAN AVW:[id number] DOB: 05-08-1945 DOA: 02/01/2014  Referring physician: Emergency department PCP: Alice Reichert, MD  Specialists:   Chief Complaint: LLE wound  HPI: [name] is a 68 y.o. female  With a hx of ESRD on TTS HD, DM2, HTN who presents to the ED for follow up of LLE wound that was treated as an outpatient. Pt and family are very poor historians. Per history, pt noted to have a non-healing ulcer to the medial lower leg that had failed treatment with bactrim as an outpatient. The patient subsequently presented to ED with increased drainage and odor. In the ED, pt was found not to have leukocytosis or fevers. The patient was started on empiric vanc and zosyn and the hospitalist service consulted for consideration for admission  Review of Systems:   Per above, the remainder of the 10pt ros reviewed and are neg  Past Medical History  Diagnosis Date  . Type 2 diabetes mellitus   . Hypercholesteremia   . Essential hypertension, benign   . UTI (urinary tract infection)   . Ruptured appendix   . Iron deficiency anemia   . Noncompliance   . Renal insufficiency     Dialysis Tues, Thurs, & Sat  . Dialysis patient     Tuesday, Thursday and Saturday   . GERD (gastroesophageal reflux disease)   . 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 Appendectomy  2008    Exploratory laparotomy  . Left carpal tunnel sugrey    . Stitch granuloma excision  2009  . Esophagogastroduodenoscopy  01/19/2011    Procedure: ESOPHAGOGASTRODUODENOSCOPY (EGD);  Surgeon: Malissa Hippo, MD;  Location: [facility];  Service: Endoscopy;  Laterality: N/A;  . Abdominal hysterectomy    . Tonsillectomy    . Cataract extraction w/phaco  07/10/2011    Procedure: CATARACT EXTRACTION PHACO AND INTRAOCULAR LENS PLACEMENT (IOC);  Surgeon: Gemma Payor, MD;  Location: [facility];  Service: Ophthalmology;   Laterality: Left;  CDE: 20.36  . Cataract extraction w/phaco  08/07/2011    Procedure: CATARACT EXTRACTION PHACO AND INTRAOCULAR LENS PLACEMENT (IOC);  Surgeon: Gemma Payor, MD;  Location: [facility];  Service: Ophthalmology;  Laterality: Right;  CDE:15.31   Social History:  reports that she has never smoked. She has never used smokeless tobacco. She reports that she does not drink alcohol or use illicit drugs. where does patient live--home, ALF, SNF? and with whom if at home?  Can patient participate in ADLs?  No Known Allergies  Family History  Problem Relation Age of Onset  . Hypertension    . Anesthesia problems Neg Hx   . Hypotension Neg Hx   . Malignant hyperthermia Neg Hx   . Pseudochol deficiency Neg Hx     (be sure to complete)  Prior to Admission medications   Medication Sig Start Date End Date Taking? Authorizing Provider  carvedilol (COREG) 12.5 MG tablet Take 12.5 mg by mouth 2 (two) times daily. 01/24/11 04/15/13  Alice Reichert, MD  doxercalciferol (HECTOROL) 0.5 MCG capsule Take 0.5 mcg by mouth 3 (three) times a week.    Historical Provider, MD  Epoetin Alfa (EPOGEN IJ) Inject 33,000 Units as directed 3 (three) times a week.    Historical Provider, MD  hydrALAZINE (APRESOLINE) 50 MG tablet Take 50 mg by mouth 4 (four) times daily. 01/24/11 04/15/13  Alice Reichert, MD  insulin glargine (LANTUS SOLOSTAR) 100 UNIT/ML injection Inject 5-10 Units into the skin at bedtime. Only given for elevated sugar levels    Historical Provider, MD  IRON PO Take 50 mg by mouth once a week.    Historical Provider, MD  isosorbide mononitrate (IMDUR) 60 MG 24 hr tablet Take 1 tablet (60 mg total) by mouth daily. 01/24/11 04/15/13  Alice ReichertAngus G McInnis, MD  meclizine (ANTIVERT) 12.5 MG tablet Take 12.5 mg by mouth every 4 (four) hours as needed for dizziness.    Historical Provider, MD  pantoprazole (PROTONIX) 40 MG tablet Take 40 mg by mouth daily at 12 noon. 01/24/11 04/15/13  Alice ReichertAngus G McInnis, MD   sevelamer (RENVELA) 800 MG tablet Take 800-1,600 mg by mouth 3 (three) times daily with meals.     Historical Provider, MD  sulfamethoxazole-trimethoprim (BACTRIM DS) 800-160 MG per tablet Take 1 tablet by mouth daily. Starting 01/20/2014 x 7 days.    Historical Provider, MD   Physical Exam: Filed Vitals:   02/01/14 1213 02/01/14 1231 02/01/14 1300 02/01/14 1340  BP: 197/80 183/80 185/79 188/80  Pulse: 67 68 68 70  Temp:  98.2 F (36.8 C)    TempSrc:  Oral    Resp: 16 18  18   SpO2: 98% 98%  96%     General:  Awake, in nad  Eyes: PERRL B  ENT: membranes moist, dentition fair  Neck: trachea midline, neck supple  Cardiovascular: regular, s1, s2  Respiratory: normal resp effort, no wheezing  Abdomen: soft, nondistended  Skin: normal skin turgor, large ulceration along medial aspect of lower L leg with bloody, malodorous drainage  Musculoskeletal: perfused, no clubbing  Psychiatric: mood/affect normal//no auditory/visual hallucinations  Neurologic: cn2-12 grossly intact, strength/sensation intact  Labs on Admission:  Basic Metabolic Panel:  Recent Labs Lab 02/01/14 1001  NA 139  K 4.0  CL 96  CO2 29  GLUCOSE 95  BUN 32*  CREATININE 3.72*  CALCIUM 9.0   Liver Function Tests:  Recent Labs Lab 02/01/14 1001  AST 18  ALT 11  ALKPHOS 92  BILITOT 0.6  PROT 7.6  ALBUMIN 3.7   No results found for this basename: LIPASE, AMYLASE,  in the last 168 hours No results found for this basename: AMMONIA,  in the last 168 hours CBC:  Recent Labs Lab 02/01/14 1001  WBC 6.8  NEUTROABS 4.9  HGB 12.1  HCT 36.2  MCV 93.8  PLT 322   Cardiac Enzymes: No results found for this basename: CKTOTAL, CKMB, CKMBINDEX, TROPONINI,  in the last 168 hours  BNP (last 3 results) No results found for this basename: PROBNP,  in the last 8760 hours CBG: No results found for this basename: GLUCAP,  in the last 168 hours  Radiological Exams on Admission: Dg Tibia/fibula  Left  02/01/2014   CLINICAL DATA:  worsening wound to her left lower medial leg. She has treated the wound with bactrim without resolution; the bactrim was prescribed 9/22 and finished 9/30. Her husband reports the wound is associated with malodor and discharge. No known cause of the wound. No known injury. Hx diabetes, HTN, Pt is on dialysis  EXAM: LEFT TIBIA AND FIBULA - 2 VIEW  COMPARISON:  None.  FINDINGS: There is no evidence of fracture or other focal bone lesions. Soft tissue defect at the posteromedial aspect of the distal tibial shaft. No subcutaneous gas. No focal cortical destruction. No radiodense foreign body. Extensive arterial calcifications.  IMPRESSION: Soft tissue defect without bone abnormality.  Electronically Signed   By: Oley Balm M.D.   On: 02/01/2014 11:31    Assessment/Plan Active Problems:   Wound infection   Cellulitis   1. Diabetic LLE wound/cellulitis failing outpatient antibiotics 1. Blood cultures ordered through ED, pending 2. Continue on Vanc and Zosyn for now 3. Consider Orthopedic consultation when available 4. Admit to med-surg 2. DM2 1. Will continue on SSI coverage 3. ESRD 1. On TTS HD 2. Will consult Nephrology 4. HTN 1. BP stable 2. Cont home regimen 5. DVT prophylaxis 1. Heparin subQ  Code Status: Full (must indicate code status--if unknown or must be presumed, indicate so) Family Communication: Pt and family at bedside (indicate person spoken with, if applicable, with phone number if by telephone) Disposition Plan: pending (indicate anticipated LOS)  Time spent:  Tynlee Bayle, Scheryl Marten [facility] Pager [phone]  If 7PM-7AM, please contact night-coverage [email or web address] Password [email or web address] 02/01/2014, 2:00 PM

## 2014-02-01 NOTE — Progress Notes (Signed)
ANTIBIOTIC CONSULT NOTE  Pharmacy Consult for Vancomycin and Zosyn  Indication: cellulitis / Wound  No Known Allergies  Patient Measurements: Weight: 110 lb 12.8 oz (50.259 kg)  Vital Signs: Temp: 98.7 F (37.1 C) (10/04 1425) Temp Source: Oral (10/04 1425) BP: 175/70 mmHg (10/04 1425) Pulse Rate: 67 (10/04 1425) Intake/Output from previous day:   Intake/Output from this shift:    Labs:  Recent Labs  02/01/14 1001  WBC 6.8  HGB 12.1  PLT 322  CREATININE 3.72*   The CrCl is unknown because both a height and weight (above a minimum accepted value) are required for this calculation. No results found for this basename: VANCOTROUGH, Leodis BinetVANCOPEAK, VANCORANDOM, GENTTROUGH, GENTPEAK, GENTRANDOM, TOBRATROUGH, TOBRAPEAK, TOBRARND, AMIKACINPEAK, AMIKACINTROU, AMIKACIN,  in the last 72 hours   Microbiology: Recent Results (from the past 720 hour(s))  CULTURE, BLOOD (ROUTINE X 2)     Status: None   Collection Time    02/01/14 10:40 AM      Result Value Ref Range Status   Specimen Description BLOOD RIGHT ARM   Final   Special Requests     Final   Value: BOTTLES DRAWN AEROBIC AND ANAEROBIC 8CC EACH BOTTLE   Culture PENDING   Incomplete   Report Status PENDING   Incomplete  CULTURE, BLOOD (ROUTINE X 2)     Status: None   Collection Time    02/01/14 10:44 AM      Result Value Ref Range Status   Specimen Description BLOOD RIGHT HAND   Final   Special Requests BOTTLES DRAWN AEROBIC ONLY 8CC BOTTLE   Final   Culture PENDING   Incomplete   Report Status PENDING   Incomplete    Anti-infectives   Start     Dose/Rate Route Frequency Ordered Stop   02/01/14 1030  vancomycin (VANCOCIN) IVPB 1000 mg/200 mL premix     1,000 mg 200 mL/hr over 60 Minutes Intravenous  Once 02/01/14 1019 02/01/14 1300   02/01/14 1030  piperacillin-tazobactam (ZOSYN) IVPB 3.375 g     3.375 g 100 mL/hr over 30 Minutes Intravenous  Once 02/01/14 1019 02/01/14 1104      Assessment: Okay for Protocol,  usually ESRD HD on TTS.  Received initial doses in ED.  Goal of Therapy:  Eradicate infection.  Pre-Hemodialysis Vancomycin level goal range =15-25 mcg/ml  Plan:  Zosyn 2.25gm IV every 8 hours. F/U HD plans for further Vancomycin doses. Measure antibiotic drug levels at steady state Follow up culture results  Mady GemmaHayes, Mikah Poss R 02/01/2014,2:43 PM

## 2014-02-01 NOTE — ED Notes (Signed)
Pt here for evaluation of wound on left lower leg.  Husband brings pt in and is unsure of how long the wound has been in place, how long the dressing has been in place or how the wound happened (or what caused it).  Pt has been on bactrim for this.  Wound has foul odor.  As far as husband knows the dressing has not been changed lately

## 2014-02-01 NOTE — ED Notes (Signed)
Report given to GrenadaBrittany, Charity fundraiserN on 3A from room 305

## 2014-02-02 ENCOUNTER — Inpatient Hospital Stay (HOSPITAL_COMMUNITY): Payer: Medicare Other

## 2014-02-02 LAB — GLUCOSE, CAPILLARY
GLUCOSE-CAPILLARY: 173 mg/dL — AB (ref 70–99)
Glucose-Capillary: 90 mg/dL (ref 70–99)
Glucose-Capillary: 96 mg/dL (ref 70–99)
Glucose-Capillary: 128 mg/dL — ABNORMAL HIGH (ref 70–99)

## 2014-02-02 LAB — CBC
HCT: 27.5 % — ABNORMAL LOW (ref 36.0–46.0)
HEMOGLOBIN: 9.4 g/dL — AB (ref 12.0–15.0)
MCH: 31.9 pg (ref 26.0–34.0)
MCHC: 34.2 g/dL (ref 30.0–36.0)
MCV: 93.2 fL (ref 78.0–100.0)
Platelets: 303 10*3/uL (ref 150–400)
RBC: 2.95 MIL/uL — ABNORMAL LOW (ref 3.87–5.11)
RDW: 13.8 % (ref 11.5–15.5)
WBC: 6.6 10*3/uL (ref 4.0–10.5)

## 2014-02-02 LAB — COMPREHENSIVE METABOLIC PANEL
ALT: 7 U/L (ref 0–35)
ANION GAP: 14 (ref 5–15)
AST: 12 U/L (ref 0–37)
Albumin: 2.9 g/dL — ABNORMAL LOW (ref 3.5–5.2)
Alkaline Phosphatase: 68 U/L (ref 39–117)
BUN: 46 mg/dL — AB (ref 6–23)
CO2: 26 mEq/L (ref 19–32)
CREATININE: 4.95 mg/dL — AB (ref 0.50–1.10)
Calcium: 8.2 mg/dL — ABNORMAL LOW (ref 8.4–10.5)
Chloride: 96 mEq/L (ref 96–112)
GFR calc non Af Amer: 8 mL/min — ABNORMAL LOW (ref >90)
GFR, EST AFRICAN AMERICAN: 9 mL/min — AB (ref >90)
GLUCOSE: 81 mg/dL (ref 70–99)
POTASSIUM: 3.8 meq/L (ref 3.7–5.3)
Sodium: 136 mEq/L — ABNORMAL LOW (ref 137–147)
TOTAL PROTEIN: 6.2 g/dL (ref 6.0–8.3)
Total Bilirubin: 0.5 mg/dL (ref 0.3–1.2)

## 2014-02-02 LAB — CLOSTRIDIUM DIFFICILE BY PCR: Toxigenic C. Difficile by PCR: NEGATIVE

## 2014-02-02 MED ORDER — PIPERACILLIN-TAZOBACTAM IN DEX 2-0.25 GM/50ML IV SOLN
INTRAVENOUS | Status: AC
  Filled 2014-02-02: qty 50

## 2014-02-02 MED ORDER — SILVER SULFADIAZINE 1 % EX CREA
TOPICAL_CREAM | Freq: Every day | CUTANEOUS | Status: DC
  Filled 2014-02-02: qty 85

## 2014-02-02 MED ORDER — EPOETIN ALFA 10000 UNIT/ML IJ SOLN
10000.0000 [IU] | INTRAMUSCULAR | Status: DC
  Administered 2014-02-03 – 2014-02-05 (×2): 10000 [IU] via INTRAVENOUS
  Filled 2014-02-02 (×3): qty 1

## 2014-02-02 MED ORDER — VANCOMYCIN HCL 500 MG IV SOLR
500.0000 mg | INTRAVENOUS | Status: DC
  Administered 2014-02-03: 500 mg via INTRAVENOUS
  Filled 2014-02-02 (×2): qty 500

## 2014-02-02 NOTE — Progress Notes (Signed)
Surgery  Asked to see this 67 yr. Old W female diabetic with end stage renal disease on dialysis for punched out wound ~ 4 inches in diameter on her left lower leg which has been present for at least 2 weeks.  Apparently pt traumatized her lower leg and likely developed an infected hematoma.  Clinically the pt has a palpable dorsalis pedis pulse., and appears to have associated cellulitis and an infected hematoma.  Pt is essentially wheelchair bound and is nonambulatory.   Pt has recently had a large liquid BM and is quite a mess at present.  I will return and plan to debride this and get a closer appraisal of the viability of the tissues after debridement.   Typically these pt have a nonhealing situation, given her anemia, diabetes and renal failure.  I will check  Arterial doppler studies.  Plane films show no osteo.  Will follow pt with you.  Thank you for consult.  Dict# [id number].

## 2014-02-02 NOTE — Progress Notes (Signed)
[facility] in [place] called and made aware of patients admission per husbands request.

## 2014-02-02 NOTE — Consult Note (Signed)
Reason for Consult: End-stage renal disease Referring Physician: Dr. Dia Sitter [name] is an 68 y.o. female.  HPI: She is a patient who has history of diabetes, hypertension and end-stage renal disease on maintenance hemodialysis presently brought by her family's because of worsening of her her wound and drainage. Initially patient was seen about 3 weeks ago I will that time she had a small wound on the anterior of her left leg after patient bumped against her wheelchair at home. Since it was inflamed patient was put on antibiotics. However presently patient seems to have another wound on the medial side which seems to be significantly worse. Patient was history of dementia he is very difficult to get any additional information. Presently she denies any fever chills or sweating.  Past Medical History  Diagnosis Date  . Type 2 diabetes mellitus   . Hypercholesteremia   . Essential hypertension, benign   . UTI (urinary tract infection)   . Ruptured appendix   . Iron deficiency anemia   . Noncompliance   . Renal insufficiency     Dialysis Tues, Thurs, & Sat  . Dialysis patient     Tuesday, Thursday and Saturday   . GERD (gastroesophageal reflux disease)   . 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 Appendectomy  2008    Exploratory laparotomy  . Left carpal tunnel sugrey    . Stitch granuloma excision  2009  . Esophagogastroduodenoscopy  01/19/2011    Procedure: ESOPHAGOGASTRODUODENOSCOPY (EGD);  Surgeon: Rogene Houston, MD;  Location: [facility];  Service: Endoscopy;  Laterality: N/A;  . Abdominal hysterectomy    . Tonsillectomy    . Cataract extraction w/phaco  07/10/2011    Procedure: CATARACT EXTRACTION PHACO AND INTRAOCULAR LENS PLACEMENT (IOC);  Surgeon: Tonny Branch, MD;  Location: [facility] ORS;  Service: Ophthalmology;  Laterality: Left;  CDE: 20.36  . Cataract extraction w/phaco  08/07/2011    Procedure: CATARACT  EXTRACTION PHACO AND INTRAOCULAR LENS PLACEMENT (IOC);  Surgeon: Tonny Branch, MD;  Location: [facility];  Service: Ophthalmology;  Laterality: Right;  CDE:15.31    Family History  Problem Relation Age of Onset  . Hypertension    . Anesthesia problems Neg Hx   . Hypotension Neg Hx   . Malignant hyperthermia Neg Hx   . Pseudochol deficiency Neg Hx     Social History:  reports that she has never smoked. She has never used smokeless tobacco. She reports that she does not drink alcohol or use illicit drugs.  Allergies: No Known Allergies  Medications: I have reviewed the patient's current medications.  Results for orders placed during the hospital encounter of 02/01/14 (from the past 48 hour(s))  CBC WITH DIFFERENTIAL     Status: Abnormal   Collection Time    02/01/14 10:01 AM      Result Value Ref Range   WBC 6.8  4.0 - 10.5 K/uL   RBC 3.86 (*) 3.87 - 5.11 MIL/uL   Hemoglobin 12.1  12.0 - 15.0 g/dL   HCT 36.2  36.0 - 46.0 %   MCV 93.8  78.0 - 100.0 fL   MCH 31.3  26.0 - 34.0 pg   MCHC 33.4  30.0 - 36.0 g/dL   RDW 13.7  11.5 - 15.5 %   Platelets 322  150 - 400 K/uL   Neutrophils Relative % 72  43 - 77 %  Neutro Abs 4.9  1.7 - 7.7 K/uL   Lymphocytes Relative 16  12 - 46 %   Lymphs Abs 1.1  0.7 - 4.0 K/uL   Monocytes Relative 11  3 - 12 %   Monocytes Absolute 0.7  0.1 - 1.0 K/uL   Eosinophils Relative 1  0 - 5 %   Eosinophils Absolute 0.0  0.0 - 0.7 K/uL   Basophils Relative 0  0 - 1 %   Basophils Absolute 0.0  0.0 - 0.1 K/uL  COMPREHENSIVE METABOLIC PANEL     Status: Abnormal   Collection Time    02/01/14 10:01 AM      Result Value Ref Range   Sodium 139  137 - 147 mEq/L   Potassium 4.0  3.7 - 5.3 mEq/L   Chloride 96  96 - 112 mEq/L   CO2 29  19 - 32 mEq/L   Glucose, Bld 95  70 - 99 mg/dL   BUN 32 (*) 6 - 23 mg/dL   Creatinine, Ser 3.72 (*) 0.50 - 1.10 mg/dL   Calcium 9.0  8.4 - 10.5 mg/dL   Total Protein 7.6  6.0 - 8.3 g/dL   Albumin 3.7  3.5 - 5.2 g/dL   AST 18  0 - 37  U/L   ALT 11  0 - 35 U/L   Alkaline Phosphatase 92  39 - 117 U/L   Total Bilirubin 0.6  0.3 - 1.2 mg/dL   GFR calc non Af Amer 12 (*) >90 mL/min   GFR calc Af Amer 13 (*) >90 mL/min   Comment: (NOTE)     The eGFR has been calculated using the CKD EPI equation.     This calculation has not been validated in all clinical situations.     eGFR's persistently <90 mL/min signify possible Chronic Kidney     Disease.   Anion gap 14  5 - 15  SEDIMENTATION RATE     Status: Abnormal   Collection Time    02/01/14 10:01 AM      Result Value Ref Range   Sed Rate 41 (*) 0 - 22 mm/hr  I-STAT CG4 LACTIC ACID, ED     Status: None   Collection Time    02/01/14 10:25 AM      Result Value Ref Range   Lactic Acid, Venous 0.66  0.5 - 2.2 mmol/L  CULTURE, BLOOD (ROUTINE X 2)     Status: None   Collection Time    02/01/14 10:40 AM      Result Value Ref Range   Specimen Description BLOOD RIGHT ARM     Special Requests       Value: BOTTLES DRAWN AEROBIC AND ANAEROBIC 8CC EACH BOTTLE   Culture NO GROWTH 1 DAY     Report Status PENDING    CULTURE, BLOOD (ROUTINE X 2)     Status: None   Collection Time    02/01/14 10:44 AM      Result Value Ref Range   Specimen Description BLOOD RIGHT HAND     Special Requests BOTTLES DRAWN AEROBIC ONLY 8CC BOTTLE     Culture NO GROWTH 1 DAY     Report Status PENDING    MRSA PCR SCREENING     Status: None   Collection Time    02/01/14  4:30 PM      Result Value Ref Range   MRSA by PCR NEGATIVE  NEGATIVE   Comment:  The GeneXpert MRSA Assay (FDA     approved for NASAL specimens     only), is one component of a     comprehensive MRSA colonization     surveillance program. It is not     intended to diagnose MRSA     infection nor to guide or     monitor treatment for     MRSA infections.  GLUCOSE, CAPILLARY     Status: None   Collection Time    02/01/14  4:44 PM      Result Value Ref Range   Glucose-Capillary 83  70 - 99 mg/dL  GLUCOSE, CAPILLARY      Status: Abnormal   Collection Time    02/01/14  8:21 PM      Result Value Ref Range   Glucose-Capillary 180 (*) 70 - 99 mg/dL   Comment 1 Documented in Chart     Comment 2 Notify RN    COMPREHENSIVE METABOLIC PANEL     Status: Abnormal   Collection Time    02/02/14  5:17 AM      Result Value Ref Range   Sodium 136 (*) 137 - 147 mEq/L   Potassium 3.8  3.7 - 5.3 mEq/L   Chloride 96  96 - 112 mEq/L   CO2 26  19 - 32 mEq/L   Glucose, Bld 81  70 - 99 mg/dL   BUN 46 (*) 6 - 23 mg/dL   Creatinine, Ser 4.95 (*) 0.50 - 1.10 mg/dL   Calcium 8.2 (*) 8.4 - 10.5 mg/dL   Total Protein 6.2  6.0 - 8.3 g/dL   Albumin 2.9 (*) 3.5 - 5.2 g/dL   AST 12  0 - 37 U/L   ALT 7  0 - 35 U/L   Alkaline Phosphatase 68  39 - 117 U/L   Total Bilirubin 0.5  0.3 - 1.2 mg/dL   GFR calc non Af Amer 8 (*) >90 mL/min   GFR calc Af Amer 9 (*) >90 mL/min   Comment: (NOTE)     The eGFR has been calculated using the CKD EPI equation.     This calculation has not been validated in all clinical situations.     eGFR's persistently <90 mL/min signify possible Chronic Kidney     Disease.   Anion gap 14  5 - 15  CBC     Status: Abnormal   Collection Time    02/02/14  5:17 AM      Result Value Ref Range   WBC 6.6  4.0 - 10.5 K/uL   RBC 2.95 (*) 3.87 - 5.11 MIL/uL   Hemoglobin 9.4 (*) 12.0 - 15.0 g/dL   Comment: DELTA CHECK NOTED     RESULT REPEATED AND VERIFIED   HCT 27.5 (*) 36.0 - 46.0 %   MCV 93.2  78.0 - 100.0 fL   MCH 31.9  26.0 - 34.0 pg   MCHC 34.2  30.0 - 36.0 g/dL   RDW 13.8  11.5 - 15.5 %   Platelets 303  150 - 400 K/uL  GLUCOSE, CAPILLARY     Status: None   Collection Time    02/02/14  7:46 AM      Result Value Ref Range   Glucose-Capillary 90  70 - 99 mg/dL  GLUCOSE, CAPILLARY     Status: Abnormal   Collection Time    02/02/14 11:25 AM      Result Value Ref Range   Glucose-Capillary 173 (*) 70 - 99  mg/dL   Comment 1 Notify RN      Dg Tibia/fibula Left  02/01/2014   CLINICAL DATA:   worsening wound to her left lower medial leg. She has treated the wound with bactrim without resolution; the bactrim was prescribed 9/22 and finished 9/30. Her husband reports the wound is associated with malodor and discharge. No known cause of the wound. No known injury. Hx diabetes, HTN, Pt is on dialysis  EXAM: LEFT TIBIA AND FIBULA - 2 VIEW  COMPARISON:  None.  FINDINGS: There is no evidence of fracture or other focal bone lesions. Soft tissue defect at the posteromedial aspect of the distal tibial shaft. No subcutaneous gas. No focal cortical destruction. No radiodense foreign body. Extensive arterial calcifications.  IMPRESSION: Soft tissue defect without bone abnormality.   Electronically Signed   By: Arne Cleveland M.D.   On: 02/01/2014 11:31    Review of Systems  Constitutional: Negative for fever and chills.  Respiratory: Negative for shortness of breath.   Cardiovascular: Negative for chest pain, orthopnea and leg swelling.  Gastrointestinal: Negative for nausea and vomiting.   Blood pressure 144/51, pulse 63, temperature 98.8 F (37.1 C), temperature source Oral, resp. rate 14, height _0  (1.549 m), weight 51.256 kg (113 lb), SpO2 98.00%. Physical Exam  Constitutional: No distress.  Eyes: Left eye exhibits no discharge.  Neck: No JVD present.  Cardiovascular: Normal rate and regular rhythm.   No murmur heard. Respiratory: No respiratory distress. She has no wheezes.  GI: She exhibits distension. There is no tenderness. There is no rebound.  Musculoskeletal: She exhibits edema.  Patient with wound on the medial side of her left leg above the medial malealous. With some erythema and necrotic tissue    Assessment/Plan: Problem #1 cellulitis of the medial side of her left leg. Presently patient is a febrile and her white blood cell count is normal. Patient is started on antibiotics. Problem #2 end-stage renal disease: She status post hemodialysis on Saturday patient does not have  any uremic signs and symptoms. Problem #3 hypertension: Her blood pressure is reasonably controlled Problem #4 diabetes Problem #5 anemia: Her hemoglobin is below range Problem #6 metabolic bone disease : Her calcium is range phosphorus is not available. Plan: We'll make arrangements for patient to get dialysis tomorrow We used Epogen after each dialysis We'll check her basic metabolic panel, phosphorus and CBC in the morning.   Renaldo Gornick S 02/02/2014, 1:17 PM

## 2014-02-02 NOTE — Care Management Note (Addendum)
    Page 1 of 2   02/06/2014     12:08:51 PM CARE MANAGEMENT NOTE 02/06/2014  Patient:  [name],[name]   Account Number:  [id number]  Date Initiated:  02/02/2014  Documentation initiated by:  Sharrie RothmanBLACKWELL,Cashus Halterman C  Subjective/Objective Assessment:   Pt admitted from home with wound infection. Pt lives at home with husband and 2 daughters who provide the care to the pt. Pt has a w/c and walker for home use. [facility] [place] and family provides transportation.     Action/Plan:   Pts husband refuses any HH at discharge. Will continue to follow for discharge planning needs.   Anticipated DC Date:  02/06/2014   Anticipated DC Plan:  HOME W HOME HEALTH SERVICES      DC Planning Services  CM consult      [facility]  [facility]   Choice offered to / List presented to:  C-3 Spouse   DME arranged  VAC      DME agency  [facility]     HH arranged  HH-1 RN      [facility]   Status of service:  Completed, signed off Medicare Important Message given?  YES (If response is "NO", the following Medicare IM given date fields will be blank) Date Medicare IM given:  02/06/2014 Medicare IM given by:  Sharrie RothmanBLACKWELL,Karmen Altamirano C Date Additional Medicare IM given:   Additional Medicare IM given by:    Discharge Disposition:  HOME W HOME HEALTH SERVICES  Per UR Regulation:  Reviewed for med. necessity/level of care/duration of stay  If discussed at Long Length of Stay Meetings, dates discussed:    Comments:  02/06/14 1205 Arlyss Queenammy Margot Oriordan, RN BSN CM Pt to be discharged home today with wound vac and [facility] (per family choice). Alroy BailiffLinda Lothian of [facility] is aware and will collect the pts information from the chart. HH services to start this pm once pt arrives home to attach wound vac. Wound vac and supplies to be delivered to pts hospital room prior to d/c by [facility]. [facility] No other CM needs noted.  02/05/14 1400 Geneva Bolden RN/CM Pt now has  wound VAC and will go home with this. Spouse OK with this, but does not really quite understand the VAC. [facility] notified and will do paperwork. HH to follow to do dressing changes. Explained RN will do the changes. Daughters will probably not need to learn this dressing.  Spouse and one daughter to be here in am for possible D/C. Daughter will take pt home. 02/04/14 1100 Geneva Bolden RN/CM Spoke with pt and spouse again and explained that pt will need wound care to leg. Explained that someone will have to do this at home. Mr Sherrie MustacheFisher says his daughters will be willing to do this he thinks. They both work at [employer], but he will try to find out their schedules and have them come to assist and learn the dressing change routine. He agrees [facility] may visit to assist with this 02/02/14 1300 Arlyss Queenammy Maxxon Schwanke, Charity fundraiserN BSN CM

## 2014-02-02 NOTE — Progress Notes (Signed)
Dr. Renard MatterMcInnis called to clarify need for surgery consult - was mentioned in note, but not ordered.  MD requests consult to be placed to Dr. Malvin JohnsBradford concerning L leg wound.  Secretary made aware and Dr. Malvin JohnsBradford added to treatment team.

## 2014-02-02 NOTE — Progress Notes (Signed)
Surgery  Filed Vitals:   02/02/14 1541  BP: 128/48  Pulse: 62  Temp: 98.1 F (36.7 C)  Resp: 16    Wound debrided of a large hematoma.  I didn't"t culture this as she has been on multiple antibiotics.  No pus or crepitance encountered.  Wound irrigated with saline and packed with roll gauze.  Will need daily dressing changes  For dialysis in AM.

## 2014-02-02 NOTE — Consult Note (Signed)
Surgical consult pending, WOC will hold off on consultation for this reason.  Once surgery has evaluated patient if still needed will complete consultation at that time.   Harveen Flesch BayonneAustin RN,CWOCN [phone]

## 2014-02-02 NOTE — Progress Notes (Signed)
UR chart review completed.  

## 2014-02-02 NOTE — Progress Notes (Signed)
Patient had two loose BM's back to back while getting bathed this AM.  C.diff protocol started and sample sent.  Patient also has redness to sacrum that is blanchable.  Foam dressing placed and patient turned.

## 2014-02-02 NOTE — Progress Notes (Signed)
ANTIBIOTIC CONSULT NOTE  Pharmacy Consult for Vancomycin and Zosyn  Indication: cellulitis / Wound  No Known Allergies  Patient Measurements: Height: 5\' 1"  (154.9 cm) Weight: 113 lb (51.256 kg) IBW/kg (Calculated) : 47.8  Vital Signs: Temp: 98.1 F (36.7 C) (10/05 1541) Temp Source: Oral (10/05 1541) BP: 128/48 mmHg (10/05 1541) Pulse Rate: 62 (10/05 1541) Intake/Output from previous day: 10/04 0701 - 10/05 0700 In: 240 [P.O.:240] Out: -  Intake/Output from this shift:    Labs:  Recent Labs  02/01/14 1001 02/02/14 0517  WBC 6.8 6.6  HGB 12.1 9.4*  PLT 322 303  CREATININE 3.72* 4.95*   Estimated Creatinine Clearance: 8.2 ml/min (by C-G formula based on Cr of 4.95). No results found for this basename: VANCOTROUGH, Leodis Binet, VANCORANDOM, GENTTROUGH, GENTPEAK, GENTRANDOM, TOBRATROUGH, TOBRAPEAK, TOBRARND, AMIKACINPEAK, AMIKACINTROU, AMIKACIN,  in the last 72 hours   Microbiology: Recent Results (from the past 720 hour(s))  CULTURE, BLOOD (ROUTINE X 2)     Status: None   Collection Time    02/01/14 10:40 AM      Result Value Ref Range Status   Specimen Description BLOOD RIGHT ARM   Final   Special Requests     Final   Value: BOTTLES DRAWN AEROBIC AND ANAEROBIC 8CC EACH BOTTLE   Culture NO GROWTH 1 DAY   Final   Report Status PENDING   Incomplete  CULTURE, BLOOD (ROUTINE X 2)     Status: None   Collection Time    02/01/14 10:44 AM      Result Value Ref Range Status   Specimen Description BLOOD RIGHT HAND   Final   Special Requests BOTTLES DRAWN AEROBIC ONLY 8CC BOTTLE   Final   Culture NO GROWTH 1 DAY   Final   Report Status PENDING   Incomplete  MRSA PCR SCREENING     Status: None   Collection Time    02/01/14  4:30 PM      Result Value Ref Range Status   MRSA by PCR NEGATIVE  NEGATIVE Final   Comment:            The GeneXpert MRSA Assay (FDA     approved for NASAL specimens     only), is one component of a     comprehensive MRSA colonization   surveillance program. It is not     intended to diagnose MRSA     infection nor to guide or     monitor treatment for     MRSA infections.  CLOSTRIDIUM DIFFICILE BY PCR     Status: None   Collection Time    02/02/14 10:31 AM      Result Value Ref Range Status   C difficile by pcr NEGATIVE  NEGATIVE Final    Anti-infectives   Start     Dose/Rate Route Frequency Ordered Stop   02/03/14 1200  vancomycin (VANCOCIN) 500 mg in sodium chloride 0.9 % 100 mL IVPB     500 mg 100 mL/hr over 60 Minutes Intravenous Every T-Th-Sa (Hemodialysis) 02/02/14 1601     02/01/14 1800  piperacillin-tazobactam (ZOSYN) IVPB 2.25 g     2.25 g 100 mL/hr over 30 Minutes Intravenous Every 8 hours 02/01/14 1447     02/01/14 1030  vancomycin (VANCOCIN) IVPB 1000 mg/200 mL premix     1,000 mg 200 mL/hr over 60 Minutes Intravenous  Once 02/01/14 1019 02/01/14 1300   02/01/14 1030  piperacillin-tazobactam (ZOSYN) IVPB 3.375 g     3.375 g 100  mL/hr over 30 Minutes Intravenous  Once 02/01/14 1019 02/01/14 1104     Assessment: Okay for Protocol, 67yo femaiel with ESRD requiring HD on TTS.  Received initial doses in ED.  Plan to resume dialysis tomorrow.  Goal of Therapy:  Eradicate infection.  Pre-Hemodialysis Vancomycin level goal range =15-25 mcg/ml  Plan:  Zosyn 2.25gm IV every 8 hours. Vancomycin 500mg  IV after each HD (T-Th-Sa) Measure antibiotic drug levels at steady state Follow up culture results  Valrie HartHall, Ludie Hudon A 02/02/2014,4:02 PM

## 2014-02-02 NOTE — ED Provider Notes (Signed)
This chart was scribed for Enid SkeensJoshua M Shiah Berhow, MD, by Yevette EdwardsAngela Bracken, ED Scribe. This patient was seen in room APA08/APA08 and the patient's care was started at 10:12 AM.  HPI Comments:  [name] is a 68 y.o. female, with a h/o insulin-dependent DM, who presents to the Emergency Department complaining of a worsening wound to her left lower medial leg. She has treated the wound with bactrim without resolution; the bactrim was prescribed 9/22 and finished 9/30. Her husband reports the wound is associated with malodor and discharge. He denies fever, chills, abdominal pain, back pain, or nausea. She has Tuesday, Thursday, and Saturday dialysis.  PE:  Eye: No scleral icterus; pupils equal.  Abdomen: Soft, nontender  Skin: To the left lower extremity, the pt has approximately 10 cm length of swelling, warmth and erythema with large deep central ulceration which is approximately 5 cm in length. She has mild drainage and clotted blood within. Neurovascularly intact to left lower extremity. Bone not visualized, however, palpated at the base of the wound.  Advised pt and her husband of plan for hospital admission for possible osteomyelitis and wound care. They agreed.    Enid SkeensJoshua M Kymora Sciara, MD 02/02/14 [id number]

## 2014-02-02 NOTE — Progress Notes (Signed)
Subjective: The patient is fairly comfortable this morning. She does not have any complaints but she does have a large wound on left lower leg with surrounding cellulitis and drainage. She does have end-stage renal disease and is on dialysis. She does have a history diabetes mellitus hypertension and diastolic systolic congestive heart failure.  Objective: Vital signs in last 24 hours: Temp:  [98.2 F (36.8 C)-98.8 F (37.1 C)] 98.8 F (37.1 C) (10/04 2225) Pulse Rate:  [63-73] 63 (10/04 2225) Resp:  [14-20] 16 (10/04 2225) BP: (122-197)/(56-80) 122/56 mmHg (10/04 2225) SpO2:  [95 %-99 %] 98 % (10/04 2225) Weight:  [50.258 kg (110 lb 12.8 oz)-50.259 kg (110 lb 12.8 oz)] 50.258 kg (110 lb 12.8 oz) (10/04 2225) Weight change:  Last BM Date: 02/01/14  Intake/Output from previous day: 10/04 0701 - 10/05 0700 In: 240 [P.O.:240] Out: -  Intake/Output this shift:    Physical Exam: General appearance the patient is awake and alert with minimal complaints  HEENT negative  Neck supple no JVD or thyroid abnormalities  Heart regular rhythm no murmurs  Lungs clear to P&A  Abdomen no palpable organs or masses  Skin large ulcerated lesion on the medial aspect of left lower leg with malodorous drainage   Recent Labs  02/01/14 1001  WBC 6.8  HGB 12.1  HCT 36.2  PLT 322   BMET  Recent Labs  02/01/14 1001  NA 139  K 4.0  CL 96  CO2 29  GLUCOSE 95  BUN 32*  CREATININE 3.72*  CALCIUM 9.0    Studies/Results: Dg Tibia/fibula Left  02/01/2014   CLINICAL DATA:  worsening wound to her left lower medial leg. She has treated the wound with bactrim without resolution; the bactrim was prescribed 9/22 and finished 9/30. Her husband reports the wound is associated with malodor and discharge. No known cause of the wound. No known injury. Hx diabetes, HTN, Pt is on dialysis  EXAM: LEFT TIBIA AND FIBULA - 2 VIEW  COMPARISON:  None.  FINDINGS: There is no evidence of fracture or other  focal bone lesions. Soft tissue defect at the posteromedial aspect of the distal tibial shaft. No subcutaneous gas. No focal cortical destruction. No radiodense foreign body. Extensive arterial calcifications.  IMPRESSION: Soft tissue defect without bone abnormality.   Electronically Signed   By: Oley Balmaniel  Hassell M.D.   On: 02/01/2014 11:31    Medications:  . carvedilol  12.5 mg Oral BID  . doxercalciferol  0.5 mcg Oral Once per day on Mon Wed Fri  . heparin  5,000 Units Subcutaneous 3 times per day  . hydrALAZINE  50 mg Oral QID  . insulin aspart  0-15 Units Subcutaneous TID WC  . insulin aspart  0-5 Units Subcutaneous QHS  . isosorbide mononitrate  60 mg Oral Daily  . pantoprazole  40 mg Oral Q1200  . piperacillin-tazobactam (ZOSYN)  IV  2.25 g Intravenous Q8H  . sevelamer carbonate  800-1,600 mg Oral TID WC        Assessment/Plan: Wound infection and cellulitis left lower leg-blood cultures have been ordered the patient is on vancomycin and Zosyn-will obtain wound care consult and surgical consult  2 end-stage renal failure-continue continue dialysis as scheduled we'll order nephrology consult  3. Diabetes mellitus type 2-to continue current insulin dosage and diabetic diet  4. Essential hypertension condition stable continue to monitor continue current meds   LOS: 1 day   Sheril Hammond G 02/02/2014, 6:24 AM

## 2014-02-02 NOTE — Progress Notes (Signed)
Patient had another large loose BM.  Light ring of what appeared to be blood noted on pad.  Pt does have hemorrhoids, but not bleeding as far as I can tell.  Questionable patient passing some urine with blood tinge?  Will continue to monitor for overt bleeding.  Patients c. Diff sample negative.  Precautions D/C'd..Marland Kitchen

## 2014-02-03 LAB — GLUCOSE, CAPILLARY
GLUCOSE-CAPILLARY: 123 mg/dL — AB (ref 70–99)
GLUCOSE-CAPILLARY: 126 mg/dL — AB (ref 70–99)
Glucose-Capillary: 138 mg/dL — ABNORMAL HIGH (ref 70–99)

## 2014-02-03 LAB — BASIC METABOLIC PANEL
Anion gap: 16 — ABNORMAL HIGH (ref 5–15)
BUN: 61 mg/dL — ABNORMAL HIGH (ref 6–23)
CO2: 25 mEq/L (ref 19–32)
Calcium: 8.1 mg/dL — ABNORMAL LOW (ref 8.4–10.5)
Chloride: 94 mEq/L — ABNORMAL LOW (ref 96–112)
Creatinine, Ser: 6.36 mg/dL — ABNORMAL HIGH (ref 0.50–1.10)
GFR, EST AFRICAN AMERICAN: 7 mL/min — AB (ref >90)
GFR, EST NON AFRICAN AMERICAN: 6 mL/min — AB (ref >90)
GLUCOSE: 137 mg/dL — AB (ref 70–99)
Potassium: 3.5 mEq/L — ABNORMAL LOW (ref 3.7–5.3)
SODIUM: 135 meq/L — AB (ref 137–147)

## 2014-02-03 LAB — CBC
HCT: 27.4 % — ABNORMAL LOW (ref 36.0–46.0)
Hemoglobin: 9.4 g/dL — ABNORMAL LOW (ref 12.0–15.0)
MCH: 31.6 pg (ref 26.0–34.0)
MCHC: 34.3 g/dL (ref 30.0–36.0)
MCV: 92.3 fL (ref 78.0–100.0)
PLATELETS: 277 10*3/uL (ref 150–400)
RBC: 2.97 MIL/uL — ABNORMAL LOW (ref 3.87–5.11)
RDW: 14 % (ref 11.5–15.5)
WBC: 7.3 10*3/uL (ref 4.0–10.5)

## 2014-02-03 LAB — PRO B NATRIURETIC PEPTIDE: Pro B Natriuretic peptide (BNP): >70000 pg/mL — ABNORMAL HIGH (ref 0.0–100.0)

## 2014-02-03 LAB — PHOSPHORUS: Phosphorus: 4.8 mg/dL — ABNORMAL HIGH (ref 2.3–4.6)

## 2014-02-03 LAB — HEPATITIS B SURFACE ANTIGEN: Hepatitis B Surface Ag: NEGATIVE

## 2014-02-03 MED ORDER — HEPARIN SODIUM (PORCINE) 1000 UNIT/ML DIALYSIS
1000.0000 [IU] | INTRAMUSCULAR | Status: DC | PRN
  Filled 2014-02-03: qty 1

## 2014-02-03 MED ORDER — ALTEPLASE 2 MG IJ SOLR
2.0000 mg | Freq: Once | INTRAMUSCULAR | Status: AC | PRN
  Filled 2014-02-03: qty 2

## 2014-02-03 MED ORDER — SODIUM CHLORIDE 0.9 % IV SOLN: 100.0000 mL | INTRAVENOUS | Status: DC | PRN

## 2014-02-03 MED ORDER — LIDOCAINE-PRILOCAINE 2.5-2.5 % EX CREA: 1.0000 "application " | TOPICAL_CREAM | CUTANEOUS | Status: DC | PRN

## 2014-02-03 MED ORDER — SEVELAMER CARBONATE 800 MG PO TABS: 800.0000 mg | ORAL_TABLET | Freq: Two times a day (BID) | ORAL | Status: DC | PRN

## 2014-02-03 MED ORDER — LIDOCAINE HCL (PF) 1 % IJ SOLN
5.0000 mL | INTRAMUSCULAR | Status: DC | PRN
Start: 2014-02-03 — End: 2014-02-07

## 2014-02-03 MED ORDER — PENTAFLUOROPROP-TETRAFLUOROETH EX AERO
1.0000 "application " | INHALATION_SPRAY | CUTANEOUS | Status: DC | PRN
  Filled 2014-02-03: qty 30

## 2014-02-03 MED ORDER — SEVELAMER CARBONATE 800 MG PO TABS
2400.0000 mg | ORAL_TABLET | Freq: Three times a day (TID) | ORAL | Status: DC
  Administered 2014-02-03 – 2014-02-06 (×8): 2400 mg via ORAL
  Filled 2014-02-03 (×8): qty 3

## 2014-02-03 MED ORDER — NEPRO/CARBSTEADY PO LIQD: 237.0000 mL | ORAL | Status: DC | PRN

## 2014-02-03 NOTE — Consult Note (Signed)
NAMMarland Kitchen:  Bing MatterFISHER, Lenyx                ACCOUNT NO.:  [id number]  MEDICAL RECORD NO.:  [id number]  LOCATION:  A305                          FACILITY:  [facility]  PHYSICIAN:  Barbaraann BarthelWilliam Aleysia Oltmann, M.D. DATE OF BIRTH:  1946/01/02  DATE OF CONSULTATION:  02/02/2014 DATE OF DISCHARGE:                                CONSULTATION   NOTE:  Surgery was asked to see this 68 year old white female for a left lower leg wound.  In essence, she apparently traumatized her left lower leg at least a couple of weeks ago or so.  She has an open punched-out ulcer in this area on the slightly medial aspect of her left lower leg. It appears that she had an infected hematoma, which developed into cellulitis and an abscess, which has opened.  She has been admitted to the Medical Service and Surgery was asked to take a look at her leg.  I have reviewed her history and physical and examined the patient.  In essence, this is a difficult wound to treat with very poor likelihood of healing, this will likely be nonhealing type wound, which hopefully we can clean up and hopefully prevent dealing with an equally difficult or more difficult problem with trying to heal with amputation.  At any rate, because of her anemia, diabetes and renal failure, this makes chance for healing rather problematic.  Clinically, she has an ulcer at least 4 inches in diameter.  It appears to be an infected hematoma.  I palpated a dorsalis pedis pulse and posterior tibial pulse.  I will check some Doppler studies, however, as these appeared to be rather faint.  It should be noted that this patient is wheelchair bound and essentially nonambulatory.  The cellulitis appears to be resolving and I will return and debride this at the bedside.  I will have to work around her dialysis schedule.  Hopefully, I can do this because of her diabetic neuropathy, she will probably not require much local anesthesia for pain control while doing this.  I will be able  after debriding to evaluate the viability of the tissues after the debridement.  At present, she has had a large liquid bowel movement and she is quite messy and we will wait to clean this up and I will return later for the debridement.  I have asked the nurse to obtain the things that are necessary for that.  I have also reviewed her films that show no osteo and as I said, we will plan for arterial Doppler studies.  I will follow along with you.  Thank you for consult.     Barbaraann BarthelWilliam Mabry Tift, M.D.     WB/MEDQ  D:  02/02/2014  T:  02/03/2014  Job:  [id number]  cc:   Angus G. Renard MatterMcInnis, MD Fax: [phone]

## 2014-02-03 NOTE — Progress Notes (Signed)
Surgery  Pt undergoing dialysis no debridement possible.  Will debride if necessary in AM.

## 2014-02-03 NOTE — Progress Notes (Signed)
Subjective: The patient is more alert today and fairly comfortable. She was seen by surgical service yesterday and debridement of wound is being planned. She does have this large wound on left lower leg with surrounding cellulitis and drainage. She does have end-stage renal disease and is on dialysis. Dialysis is being planned for today. She does have diabetes mellitus hypertension and diastolic and systolic congestive heart failure.  Objective: Vital signs in last 24 hours: Temp:  [97.9 F (36.6 C)-98.8 F (37.1 C)] 98.1 F (36.7 C) (10/06 0535) Pulse Rate:  [57-67] 67 (10/06 0535) Resp:  [14-20] 20 (10/06 0535) BP: (117-151)/(48-79) 117/79 mmHg (10/06 0535) SpO2:  [95 %-99 %] 95 % (10/06 0535) Weight:  [51.256 kg (113 lb)-57.788 kg (127 lb 6.4 oz)] 57.788 kg (127 lb 6.4 oz) (10/06 0535) Weight change: 7.53 kg (16 lb 9.6 oz) Last BM Date: 02/02/14  Intake/Output from previous day: 10/05 0701 - 10/06 0700 In: 628 [P.O.:240; I.V.:338; IV Piggyback:50] Out: -  Intake/Output this shift: Total I/O In: 388 [I.V.:338; IV Piggyback:50] Out: -   Physical Exam: General appearance the patient is awake and alert with minimal complaints  HEENT negative  Neck supple no JVD or thyroid abnormalities  Heart regular rhythm no murmurs  Lungs clear to P&A  Abdomen no palpable organs or masses  Skin there is a large ulcerated lesion on the medial aspect of left lower leg with drainage   Recent Labs  02/02/14 0517 02/03/14 0500  WBC 6.6 7.3  HGB 9.4* 9.4*  HCT 27.5* 27.4*  PLT 303 277   BMET  Recent Labs  02/02/14 0517 02/03/14 0500  NA 136* 135*  K 3.8 3.5*  CL 96 94*  CO2 26 25  GLUCOSE 81 137*  BUN 46* 61*  CREATININE 4.95* 6.36*  CALCIUM 8.2* 8.1*    Studies/Results: Dg Tibia/fibula Left  02/01/2014   CLINICAL DATA:  worsening wound to her left lower medial leg. She has treated the wound with bactrim without resolution; the bactrim was prescribed 9/22 and finished  9/30. Her husband reports the wound is associated with malodor and discharge. No known cause of the wound. No known injury. Hx diabetes, HTN, Pt is on dialysis  EXAM: LEFT TIBIA AND FIBULA - 2 VIEW  COMPARISON:  None.  FINDINGS: There is no evidence of fracture or other focal bone lesions. Soft tissue defect at the posteromedial aspect of the distal tibial shaft. No subcutaneous gas. No focal cortical destruction. No radiodense foreign body. Extensive arterial calcifications.  IMPRESSION: Soft tissue defect without bone abnormality.   Electronically Signed   By: Oley Balmaniel  Hassell M.D.   On: 02/01/2014 11:31    Medications:  . carvedilol  12.5 mg Oral BID  . doxercalciferol  0.5 mcg Oral Once per day on Mon Wed Fri  . epoetin alfa  10,000 Units Intravenous Q T,Th,Sa-HD  . heparin  5,000 Units Subcutaneous 3 times per day  . hydrALAZINE  50 mg Oral QID  . insulin aspart  0-15 Units Subcutaneous TID WC  . insulin aspart  0-5 Units Subcutaneous QHS  . isosorbide mononitrate  60 mg Oral Daily  . pantoprazole  40 mg Oral Q1200  . piperacillin-tazobactam (ZOSYN)  IV  2.25 g Intravenous Q8H  . sevelamer carbonate  800-1,600 mg Oral TID WC  . silver sulfADIAZINE   Topical Daily  . vancomycin  500 mg Intravenous Q T,Th,Sa-HD        Assessment/Plan: Ulcerated lesion on the left lower leg probably infected  hematoma-plan debridement by surgery to continue vancomycin and Zosyn dosed by pharmacy  End-stage renal failure-patient is to have dialysis today is being seen by nephrology creatinine 6.36  Diabetes mellitus type 2 range 96-173 plan to continue monitor monitoring sugar continue sliding scale insulin  Hypertension-condition stable to continue current meds  Systolic and diastolic congestive heart failure-to continue carvedilol and if needed furosemide-will repeat pro BNP   LOS: 2 days   Thomas Rhude G 02/03/2014, 6:26 AM

## 2014-02-03 NOTE — Progress Notes (Signed)
Subjective: Interval History: has no complaint of nausea or vomiting. Patient presently denies any difficulty in breathing. Overall patient offers no complaints..  Objective: Vital signs in last 24 hours: Temp:  [97.9 F (36.6 C)-98.1 F (36.7 C)] 98.1 F (36.7 C) (10/06 0535) Pulse Rate:  [57-67] 67 (10/06 0535) Resp:  [14-20] 20 (10/06 0535) BP: (117-146)/(48-79) 117/79 mmHg (10/06 0535) SpO2:  [95 %-99 %] 95 % (10/06 0535) Weight:  [57.788 kg (127 lb 6.4 oz)] 57.788 kg (127 lb 6.4 oz) (10/06 0535) Weight change: 7.53 kg (16 lb 9.6 oz)  Intake/Output from previous day: 10/05 0701 - 10/06 0700 In: 628 [P.O.:240; I.V.:338; IV Piggyback:50] Out: -  Intake/Output this shift:    Generally she's alert and in no apparent distress. Chest is clear to auscultation Heart exam regular rate and rhythm no murmur Abdomen soft positive bowel sound Extremities no edema. Patient has dressing on her left leg.  Lab Results:  Recent Labs  02/02/14 0517 02/03/14 0500  WBC 6.6 7.3  HGB 9.4* 9.4*  HCT 27.5* 27.4*  PLT 303 277   BMET:  Recent Labs  02/02/14 0517 02/03/14 0500  NA 136* 135*  K 3.8 3.5*  CL 96 94*  CO2 26 25  GLUCOSE 81 137*  BUN 46* 61*  CREATININE 4.95* 6.36*  CALCIUM 8.2* 8.1*   No results found for this basename: PTH,  in the last 72 hours Iron Studies: No results found for this basename: IRON, TIBC, TRANSFERRIN, FERRITIN,  in the last 72 hours  Studies/Results: Dg Tibia/fibula Left  02/01/2014   CLINICAL DATA:  worsening wound to her left lower medial leg. She has treated the wound with bactrim without resolution; the bactrim was prescribed 9/22 and finished 9/30. Her husband reports the wound is associated with malodor and discharge. No known cause of the wound. No known injury. Hx diabetes, HTN, Pt is on dialysis  EXAM: LEFT TIBIA AND FIBULA - 2 VIEW  COMPARISON:  None.  FINDINGS: There is no evidence of fracture or other focal bone lesions. Soft tissue  defect at the posteromedial aspect of the distal tibial shaft. No subcutaneous gas. No focal cortical destruction. No radiodense foreign body. Extensive arterial calcifications.  IMPRESSION: Soft tissue defect without bone abnormality.   Electronically Signed   By: Oley Balmaniel  Hassell M.D.   On: 02/01/2014 11:31    I have reviewed the patient's current medications.  Assessment/Plan: Problem #1 cellulitis of left leg, medially, above the medial malleolus. Presently doesn't have any drainage. Patient has some dressing on it. Patient presently a febrile and her white cell count is normal. Patient is on antibiotics. Problem #2 end-stage renal disease: She status post hemodialysis on Saturday. Presently patient is for dialysis today and she didn't have any nausea or vomiting. Problem #3 diabetes Problem #4 hypertension: Her blood pressure is reasonably controlled Problem #5 metabolic bone disease: Calcium and phosphorus is range Problem #6 anemia: Her hemoglobin and hematocrit is below our target called. Problem #7 fluid management: Patient doesn't have any sign of fluid overload. Plan: We'll make arrangements for patient to get dialysis today Was used 4K./2.5 calcium bath. We'll give her Epogen 10,000 units IV after each dialysis. We'll continue his other medications as before.    LOS: 2 days   Krystelle Prashad S 02/03/2014,7:47 AM

## 2014-02-03 NOTE — Procedures (Signed)
   HEMODIALYSIS TREATMENT NOTE:  3.5 hour heparin-free dialysis completed via left upper arm AVG (16g ante/retrograde).  Goal met:  Tolerated removal of 2.1 liters without interruption in ultrafiltration.  All blood was reinfused and hemostasis was achieved within 15 minutes.  Report given to Jovita Kussmaul, Therapist, sports.  Diamond Jentz L.  Zerby, RN, CDN

## 2014-02-04 LAB — GLUCOSE, CAPILLARY
GLUCOSE-CAPILLARY: 137 mg/dL — AB (ref 70–99)
Glucose-Capillary: 106 mg/dL — ABNORMAL HIGH (ref 70–99)
Glucose-Capillary: 149 mg/dL — ABNORMAL HIGH (ref 70–99)
Glucose-Capillary: 110 mg/dL — ABNORMAL HIGH (ref 70–99)

## 2014-02-04 NOTE — Progress Notes (Signed)
Subjective: The patient remains alert and fairly comfortable. She did have dialysis yesterday and tolerated this well the wound on her left lower leg was not debrided yesterday by surgery because of dialysis. She remains on IV antibiotics. She does have end-stage renal disease diabetes mellitus hypertension and diastolic and systolic congestive heart failure.  Objective: Vital signs in last 24 hours: Temp:  [98 F (36.7 C)-99.2 F (37.3 C)] 99.2 F (37.3 C) (10/06 2111) Pulse Rate:  [58-71] 63 (10/06 2111) Resp:  [14-20] 16 (10/06 2111) BP: (100-188)/(43-83) 109/45 mmHg (10/06 2111) SpO2:  [95 %-100 %] 95 % (10/06 2111) Weight:  [50.1 kg (110 lb 7.2 oz)-52.708 kg (116 lb 3.2 oz)] 50.1 kg (110 lb 7.2 oz) (10/06 1440) Weight change: -5.08 kg (-11 lb 3.2 oz) Last BM Date: 02/02/14  Intake/Output from previous day: 10/06 0701 - 10/07 0700 In: 720 [P.O.:720] Out: 2100  Intake/Output this shift:    Physical Exam: General appearance the patient is awake and alert  HEENT negative  Neck supple no JVD or thyroid abnormalities  Heart regular rhythm systolic murmur heard at base of heart  Lungs clear to P&A  Abdomen no palpable organs or masses  Skin is a large ulcerated lesion on the medial aspect left lower leg with some drainage   Recent Labs  02/02/14 0517 02/03/14 0500  WBC 6.6 7.3  HGB 9.4* 9.4*  HCT 27.5* 27.4*  PLT 303 277   BMET  Recent Labs  02/02/14 0517 02/03/14 0500  NA 136* 135*  K 3.8 3.5*  CL 96 94*  CO2 26 25  GLUCOSE 81 137*  BUN 46* 61*  CREATININE 4.95* 6.36*  CALCIUM 8.2* 8.1*    Studies/Results: [place] Arterial Seg Multiple  02/03/2014   CLINICAL DATA:  Left lower extremity ulcerations. History of diabetes, hypertension, hyperlipidemia and end-stage renal disease.  EXAM: NONINVASIVE PHYSIOLOGIC VASCULAR STUDY OF BILATERAL LOWER EXTREMITIES  TECHNIQUE: Evaluation of both lower extremities was performed at rest, including calculation of  ankle-brachial indices, multiple segmental pressure evaluation, segmental Doppler and segmental pulse volume recording.  COMPARISON:  None.  FINDINGS: Right ABI:  Non calculable due to noncompressible vessels.  Left ABI: Calculated at 0.63 based on dorsalis pedis waveform alone. The posterior tibial artery was noncompressible.  Right Lower Extremity: Segmental evaluation shows complete lead noncompressible arteries throughout the right lower extremity, limiting segmental pressure evaluation. Waveform analysis shows biphasic waveforms at the femoral, superficial femoral and popliteal artery levels and essentially monophasic waveforms at the posterior tibial and dorsalis pedis level. Digital waveforms are fairly unremarkable.  Left Lower Extremity: Similar finding of noncompressible arteries throughout the left lower extremity except for the dorsalis pedis artery. Waveform analysis shows monophasic femoral waveform, biphasic superficial femoral and popliteal waveforms and monophasic posterior tibial and dorsalis pedis waveforms. Digital waveforms show some mild depression in the first through third toes moderate depression in the fourth and fifth toes.  IMPRESSION: Limited noninvasive study of the lower extremities due to noncompressible arteries. Ankle-brachial indices are therefore not accurate. Segmental evaluation does suggest the presence of multilevel disease in both lower extremities, likely most significantly in the tibial arteries and digital arteries. If there are significant nonhealing wounds, consider further anatomic vascular imaging study such as CT angiography with lower extremity runoff.   Electronically Signed   By: Irish LackGlenn  Yamagata M.D.   On: 02/03/2014 11:13    Medications:  . carvedilol  12.5 mg Oral BID  . doxercalciferol  0.5 mcg Oral Once per day on  Mon Wed Fri  . epoetin alfa  10,000 Units Intravenous Q T,Th,Sa-HD  . heparin  5,000 Units Subcutaneous 3 times per day  . hydrALAZINE  50 mg  Oral QID  . insulin aspart  0-15 Units Subcutaneous TID WC  . insulin aspart  0-5 Units Subcutaneous QHS  . isosorbide mononitrate  60 mg Oral Daily  . pantoprazole  40 mg Oral Q1200  . piperacillin-tazobactam (ZOSYN)  IV  2.25 g Intravenous Q8H  . sevelamer carbonate  2,400 mg Oral TID WC  . silver sulfADIAZINE   Topical Daily  . vancomycin  500 mg Intravenous Q T,Th,Sa-HD        Assessment/Plan: 1 ulcerated lesion on left lower leg probably infected hematoma-plan debridement by surgery to continue vancomycin Zosyn dosed by pharmacy  2. End-stage renal failure-patient had dialysis yesterday and is seen by nephrology  3. Diabetes mellitus type 2 plan to continue monitoring sure the sliding scale insulin  4. Hypertension essential-plan to continue current medications  5. Systolic and diastolic congestive heart failure-to continue carvedilol and furosemide as needed   LOS: 3 days   Mella Inclan G 02/04/2014, 6:14 AM

## 2014-02-04 NOTE — Progress Notes (Signed)
Surgery  Filed Vitals:   02/04/14 1426  BP: 106/44  Pulse: 56  Temp: 99 F (37.2 C)  Resp: 16    Wound irrigated and repacked.  There is no residual infection.   Beefy red granulation tissue present.  Arterial doppler studies not optimal due to calcification of vessels, but I don't think further studies are warrented at this point with this non ambulatory pt. Judging clinically by appearance, however, I think this  wound ought to heal, albeit slowly.  I think pt might benefit from wound vac.  Will consult wound care nurse to coordinate.  Continue present therapy.

## 2014-02-04 NOTE — Progress Notes (Signed)
[name]  MRN: [id number]  DOB/AGE: 1945-05-16 68 y.o.  Primary Care Physician:MCINNIS,ANGUS G, MD  Admit date: 02/01/2014  Chief Complaint:  Chief Complaint  Patient presents with  . Wound Check    S-Pt presented on  02/01/2014 with  Chief Complaint  Patient presents with  . Wound Check  .    Pt today feels better  Meds . carvedilol  12.5 mg Oral BID  . doxercalciferol  0.5 mcg Oral Once per day on Mon Wed Fri  . epoetin alfa  10,000 Units Intravenous Q T,Th,Sa-HD  . heparin  5,000 Units Subcutaneous 3 times per day  . hydrALAZINE  50 mg Oral QID  . insulin aspart  0-15 Units Subcutaneous TID WC  . insulin aspart  0-5 Units Subcutaneous QHS  . isosorbide mononitrate  60 mg Oral Daily  . pantoprazole  40 mg Oral Q1200  . piperacillin-tazobactam (ZOSYN)  IV  2.25 g Intravenous Q8H  . sevelamer carbonate  2,400 mg Oral TID WC  . silver sulfADIAZINE   Topical Daily  . vancomycin  500 mg Intravenous Q T,Th,Sa-HD      Physical Exam: Vital signs in last 24 hours: Temp:  [97.8 F (36.6 C)-99.2 F (37.3 C)] 97.8 F (36.6 C) (10/07 0630) Pulse Rate:  [58-71] 67 (10/07 0630) Resp:  [14-20] 16 (10/07 0630) BP: (100-188)/(43-83) 114/56 mmHg (10/07 0630) SpO2:  [95 %-100 %] 96 % (10/07 0630) Weight:  [110 lb 7.2 oz (50.1 kg)-116 lb 3.2 oz (52.708 kg)] 112 lb 8 oz (51.03 kg) (10/07 0630) Weight change: -11 lb 3.2 oz (-5.08 kg) Last BM Date: 02/02/14  Intake/Output from previous day: 10/06 0701 - 10/07 0700 In: 720 [P.O.:720] Out: 2100  Total I/O In: 240 [P.O.:240] Out: -    Physical Exam: General- pt is awake,alert, oriented to time place and person Resp- No acute REsp distress, decreased Bs at bases CVS- S1S2 regular in rate and rhythm, SEM + GIT- BS+, soft, NT, ND EXT- NO Cyanosis Access- left AVF +  Lab Results: CBC  Recent Labs  02/02/14 0517 02/03/14 0500  WBC 6.6 7.3  HGB 9.4* 9.4*  HCT 27.5* 27.4*  PLT 303 277    BMET  Recent Labs  02/02/14 0517 02/03/14 0500  NA 136* 135*  K 3.8 3.5*  CL 96 94*  CO2 26 25  GLUCOSE 81 137*  BUN 46* 61*  CREATININE 4.95* 6.36*  CALCIUM 8.2* 8.1*    MICRO Recent Results (from the past 240 hour(s))  CULTURE, BLOOD (ROUTINE X 2)     Status: None   Collection Time    02/01/14 10:40 AM      Result Value Ref Range Status   Specimen Description BLOOD RIGHT ARM   Final   Special Requests     Final   Value: BOTTLES DRAWN AEROBIC AND ANAEROBIC 8CC EACH BOTTLE   Culture NO GROWTH 2 DAYS   Final   Report Status PENDING   Incomplete  CULTURE, BLOOD (ROUTINE X 2)     Status: None   Collection Time    02/01/14 10:44 AM      Result Value Ref Range Status   Specimen Description BLOOD RIGHT HAND   Final   Special Requests BOTTLES DRAWN AEROBIC ONLY 8CC BOTTLE   Final   Culture NO GROWTH 2 DAYS   Final   Report Status PENDING   Incomplete  MRSA PCR SCREENING     Status: None   Collection Time  02/01/14  4:30 PM      Result Value Ref Range Status   MRSA by PCR NEGATIVE  NEGATIVE Final   Comment:            The GeneXpert MRSA Assay (FDA     approved for NASAL specimens     only), is one component of a     comprehensive MRSA colonization     surveillance program. It is not     intended to diagnose MRSA     infection nor to guide or     monitor treatment for     MRSA infections.  CLOSTRIDIUM DIFFICILE BY PCR     Status: None   Collection Time    02/02/14 10:31 AM      Result Value Ref Range Status   C difficile by pcr NEGATIVE  NEGATIVE Final      Lab Results  Component Value Date   CALCIUM 8.1* 02/03/2014   PHOS 4.8* 02/03/2014     Impression: 1)Renal  ESRD on HD                ON TTS schedule                 Pt last dialyzed yesterday                 NO need of HD today  2)HTN Medication- On Alpha and beta Blocker On Vasodilators  3)Anemia HGb at goal (9--11) On Epo  4)CKD Mineral-Bone Disorder  Phosphorus at goal. Calcium at goal  5)ID Infected  Hematoma On IV ABX  Primary MD following  6)Electrolytes  Hypokalemic Hyponatremic   7)Acid base Co2 at goal     Plan:  will ask for bMet. Will continue current care      Tashay Bozich S 02/04/2014, 9:12 AM

## 2014-02-05 LAB — CBC
HCT: 30.6 % — ABNORMAL LOW (ref 36.0–46.0)
Hemoglobin: 10.1 g/dL — ABNORMAL LOW (ref 12.0–15.0)
MCH: 31.1 pg (ref 26.0–34.0)
MCHC: 33 g/dL (ref 30.0–36.0)
MCV: 94.2 fL (ref 78.0–100.0)
Platelets: 313 10*3/uL (ref 150–400)
RBC: 3.25 MIL/uL — ABNORMAL LOW (ref 3.87–5.11)
RDW: 14.4 % (ref 11.5–15.5)
WBC: 7 10*3/uL (ref 4.0–10.5)

## 2014-02-05 LAB — BASIC METABOLIC PANEL
Anion gap: 16 — ABNORMAL HIGH (ref 5–15)
BUN: 38 mg/dL — ABNORMAL HIGH (ref 6–23)
CO2: 27 mEq/L (ref 19–32)
Calcium: 8.4 mg/dL (ref 8.4–10.5)
Chloride: 94 mEq/L — ABNORMAL LOW (ref 96–112)
Creatinine, Ser: 5.56 mg/dL — ABNORMAL HIGH (ref 0.50–1.10)
GFR calc Af Amer: 8 mL/min — ABNORMAL LOW (ref >90)
GFR calc non Af Amer: 7 mL/min — ABNORMAL LOW (ref >90)
Glucose, Bld: 104 mg/dL — ABNORMAL HIGH (ref 70–99)
Potassium: 3.8 mEq/L (ref 3.7–5.3)
Sodium: 137 mEq/L (ref 137–147)

## 2014-02-05 LAB — GLUCOSE, CAPILLARY
Glucose-Capillary: 124 mg/dL — ABNORMAL HIGH (ref 70–99)
Glucose-Capillary: 188 mg/dL — ABNORMAL HIGH (ref 70–99)
Glucose-Capillary: 181 mg/dL — ABNORMAL HIGH (ref 70–99)

## 2014-02-05 LAB — VANCOMYCIN, RANDOM: VANCOMYCIN RM: 14.5 ug/mL

## 2014-02-05 MED ORDER — VANCOMYCIN HCL IN DEXTROSE 750-5 MG/150ML-% IV SOLN
750.0000 mg | INTRAVENOUS | Status: DC
  Administered 2014-02-05: 750 mg via INTRAVENOUS
  Filled 2014-02-05 (×2): qty 150

## 2014-02-05 NOTE — Progress Notes (Signed)
Surgery  Pt. At dialysis.  Wound vac placed.  This will be coordinated with home health.  Will need 3x/week treatment.  Will follow as needed as OP.

## 2014-02-05 NOTE — Progress Notes (Signed)
ANTIBIOTIC CONSULT NOTE  Pharmacy Consult for Vancomycin and Zosyn  Indication: Wound  No Known Allergies  Patient Measurements: Height: 5\' 1"  (154.9 cm) Weight: 113 lb 8.6 oz (51.5 kg) IBW/kg (Calculated) : 47.8  Vital Signs: Temp: 98.5 F (36.9 C) (10/08 0442) Temp Source: Oral (10/08 0442) BP: 109/52 mmHg (10/08 0442) Pulse Rate: 66 (10/08 0442) Intake/Output from previous day: 10/07 0701 - 10/08 0700 In: 770 [P.O.:720; IV Piggyback:50] Out: -  Intake/Output from this shift:    Labs:  Recent Labs  02/03/14 0500 02/05/14 0526  WBC 7.3 7.0  HGB 9.4* 10.1*  PLT 277 313  CREATININE 6.36* 5.56*   Estimated Creatinine Clearance: 7.3 ml/min (by C-G formula based on Cr of 5.56).  Recent Labs  02/05/14 0526  [facility]    Microbiology: Recent Results (from the past 720 hour(s))  CULTURE, BLOOD (ROUTINE X 2)     Status: None   Collection Time    02/01/14 10:40 AM      Result Value Ref Range Status   Specimen Description BLOOD RIGHT ARM   Final   Special Requests     Final   Value: BOTTLES DRAWN AEROBIC AND ANAEROBIC 8CC EACH BOTTLE   Culture NO GROWTH 3 DAYS   Final   Report Status PENDING   Incomplete  CULTURE, BLOOD (ROUTINE X 2)     Status: None   Collection Time    02/01/14 10:44 AM      Result Value Ref Range Status   Specimen Description BLOOD RIGHT HAND   Final   Special Requests BOTTLES DRAWN AEROBIC ONLY 8CC BOTTLE   Final   Culture NO GROWTH 3 DAYS   Final   Report Status PENDING   Incomplete  MRSA PCR SCREENING     Status: None   Collection Time    02/01/14  4:30 PM      Result Value Ref Range Status   MRSA by PCR NEGATIVE  NEGATIVE Final   Comment:            The GeneXpert MRSA Assay (FDA     approved for NASAL specimens     only), is one component of a     comprehensive MRSA colonization     surveillance program. It is not     intended to diagnose MRSA     infection nor to guide or     monitor treatment for     MRSA infections.   CLOSTRIDIUM DIFFICILE BY PCR     Status: None   Collection Time    02/02/14 10:31 AM      Result Value Ref Range Status   C difficile by pcr NEGATIVE  NEGATIVE Final    Anti-infectives   Start     Dose/Rate Route Frequency Ordered Stop   02/05/14 1200  vancomycin (VANCOCIN) IVPB 750 mg/150 ml premix     750 mg 150 mL/hr over 60 Minutes Intravenous Every T-Th-Sa (Hemodialysis) 02/05/14 0841     02/03/14 1200  vancomycin (VANCOCIN) 500 mg in sodium chloride 0.9 % 100 mL IVPB  Status:  Discontinued     500 mg 100 mL/hr over 60 Minutes Intravenous Every T-Th-Sa (Hemodialysis) 02/02/14 1601 02/05/14 0841   02/01/14 1800  piperacillin-tazobactam (ZOSYN) IVPB 2.25 g     2.25 g 100 mL/hr over 30 Minutes Intravenous Every 8 hours 02/01/14 1447     02/01/14 1030  vancomycin (VANCOCIN) IVPB 1000 mg/200 mL premix     1,000 mg 200 mL/hr over  60 Minutes Intravenous  Once 02/01/14 1019 02/01/14 1300   02/01/14 1030  piperacillin-tazobactam (ZOSYN) IVPB 3.375 g     3.375 g 100 mL/hr over 30 Minutes Intravenous  Once 02/01/14 1019 02/01/14 1104     Assessment: Okay for Protocol, 68yo female with ESRD requiring HD on TTS.   Pre-dialysis Vancomycin level is below goal.  Afebrile.  Goal of Therapy:  Eradicate infection.  Pre-Hemodialysis Vancomycin level goal range =15-25 mcg/ml  Plan:  Zosyn 2.25gm IV every 8 hours. Increase Vancomycin to 750mg  IV after each HD (T-Th-Sa) Measure antibiotic drug levels at steady state Follow up culture results  Valrie Hart A 02/05/2014,8:44 AM

## 2014-02-05 NOTE — Progress Notes (Signed)
Subjective: Interval History: has no complaint  Objective: Vital signs in last 24 hours: Temp:  [98.5 F (36.9 C)-99 F (37.2 C)] 98.5 F (36.9 C) (10/08 0442) Pulse Rate:  [56-66] 66 (10/08 0442) Resp:  [16] 16 (10/08 0442) BP: (104-142)/(43-64) 109/52 mmHg (10/08 0442) SpO2:  [95 %-96 %] 96 % (10/08 0442) Weight:  [51.5 kg (113 lb 8.6 oz)] 51.5 kg (113 lb 8.6 oz) (10/08 0442) Weight change: -1.208 kg (-2 lb 10.6 oz)  Intake/Output from previous day: 10/07 0701 - 10/08 0700 In: 770 [P.O.:720; IV Piggyback:50] Out: -  Intake/Output this shift:    Generally she's alert and in no apparent distress. Eating her break fast Chest is clear to auscultation Heart exam regular rate and rhythm no murmur Abdomen soft positive bowel sound Extremities no edema. Patient has has wound he him heVAC on her left leg  Lab Results:  Recent Labs  02/03/14 0500 02/05/14 0526  WBC 7.3 7.0  HGB 9.4* 10.1*  HCT 27.4* 30.6*  PLT 277 313   BMET:   Recent Labs  02/03/14 0500 02/05/14 0526  NA 135* 137  K 3.5* 3.8  CL 94* 94*  CO2 25 27  GLUCOSE 137* 104*  BUN 61* 38*  CREATININE 6.36* 5.56*  CALCIUM 8.1* 8.4   No results found for this basename: PTH,  in the last 72 hours Iron Studies: No results found for this basename: IRON, TIBC, TRANSFERRIN, FERRITIN,  in the last 72 hours  Studies/Results: [place] Arterial Seg Multiple  02/03/2014   CLINICAL DATA:  Left lower extremity ulcerations. History of diabetes, hypertension, hyperlipidemia and end-stage renal disease.  EXAM: NONINVASIVE PHYSIOLOGIC VASCULAR STUDY OF BILATERAL LOWER EXTREMITIES  TECHNIQUE: Evaluation of both lower extremities was performed at rest, including calculation of ankle-brachial indices, multiple segmental pressure evaluation, segmental Doppler and segmental pulse volume recording.  COMPARISON:  None.  FINDINGS: Right ABI:  Non calculable due to noncompressible vessels.  Left ABI: Calculated at 0.63 based on dorsalis  pedis waveform alone. The posterior tibial artery was noncompressible.  Right Lower Extremity: Segmental evaluation shows complete lead noncompressible arteries throughout the right lower extremity, limiting segmental pressure evaluation. Waveform analysis shows biphasic waveforms at the femoral, superficial femoral and popliteal artery levels and essentially monophasic waveforms at the posterior tibial and dorsalis pedis level. Digital waveforms are fairly unremarkable.  Left Lower Extremity: Similar finding of noncompressible arteries throughout the left lower extremity except for the dorsalis pedis artery. Waveform analysis shows monophasic femoral waveform, biphasic superficial femoral and popliteal waveforms and monophasic posterior tibial and dorsalis pedis waveforms. Digital waveforms show some mild depression in the first through third toes moderate depression in the fourth and fifth toes.  IMPRESSION: Limited noninvasive study of the lower extremities due to noncompressible arteries. Ankle-brachial indices are therefore not accurate. Segmental evaluation does suggest the presence of multilevel disease in both lower extremities, likely most significantly in the tibial arteries and digital arteries. If there are significant nonhealing wounds, consider further anatomic vascular imaging study such as CT angiography with lower extremity runoff.   Electronically Signed   By: Irish LackGlenn  Yamagata M.D.   On: 02/03/2014 11:13    I have reviewed the patient's current medications.  Assessment/Plan: Problem #1 cellulitis of left leg: s/p debridement on wound VAC . Patient also on antibiotics . She is afebrile with normal blood count. Problem #2 end-stage renal disease: She status post hemodialysis on Tuesday. She is do for dialysis today Problem #3 diabetes Problem #4 hypertension: Her blood  pressure is reasonably controlled Problem #5 metabolic bone disease: Calcium and phosphorus is range Problem #6 anemia: Her  hemoglobin and hematocrit has come up to our target goal Problem #7 fluid management: Patient doesn't have any sign of fluid overload. Plan: We'll make arrangements for patient to get dialysis today Was used 4K./2.5 calcium bath. We'll continue with Epogen    LOS: 4 days   Marquise Wicke S 02/05/2014,9:21 AM

## 2014-02-05 NOTE — Progress Notes (Signed)
Subjective: The patient remains fairly comfortable and alert. She is scheduled for dialysis today. The wound has been debrided by surgical service and apparently took some better. She remains on IV antibiotics. She does have end-stage renal disease diabetes mellitus hypertension diastolic and systolic congestive heart failure. Apparently her wound VAC will be done today.  Objective: Vital signs in last 24 hours: Temp:  [97.8 F (36.6 C)-99 F (37.2 C)] 98.5 F (36.9 C) (10/08 0442) Pulse Rate:  [56-67] 66 (10/08 0442) Resp:  [16] 16 (10/08 0442) BP: (104-142)/(43-64) 109/52 mmHg (10/08 0442) SpO2:  [95 %-96 %] 96 % (10/08 0442) Weight:  [51.03 kg (112 lb 8 oz)-51.5 kg (113 lb 8.6 oz)] 51.5 kg (113 lb 8.6 oz) (10/08 0442) Weight change: -1.208 kg (-2 lb 10.6 oz) Last BM Date: 02/04/14  Intake/Output from previous day: 10/07 0701 - 10/08 0700 In: 770 [P.O.:720; IV Piggyback:50] Out: -  Intake/Output this shift:    Physical Exam: General appearance patient awake alert  HEENT negative  Neck supple no JVD or thyroid abnormalities  Heart regular rhythm systolic murmur heard at base of heart  Lungs clear to P&A  Abdomen no palpable organs or masses  Skin-is a large ulcerated lesion with medial aspect left lower leg  Extremities free of edema   Recent Labs  02/03/14 0500  WBC 7.3  HGB 9.4*  HCT 27.4*  PLT 277   BMET  Recent Labs  02/03/14 0500  NA 135*  K 3.5*  CL 94*  CO2 25  GLUCOSE 137*  BUN 61*  CREATININE 6.36*  CALCIUM 8.1*    Studies/Results: [place] Arterial Seg Multiple  02/03/2014   CLINICAL DATA:  Left lower extremity ulcerations. History of diabetes, hypertension, hyperlipidemia and end-stage renal disease.  EXAM: NONINVASIVE PHYSIOLOGIC VASCULAR STUDY OF BILATERAL LOWER EXTREMITIES  TECHNIQUE: Evaluation of both lower extremities was performed at rest, including calculation of ankle-brachial indices, multiple segmental pressure evaluation, segmental  Doppler and segmental pulse volume recording.  COMPARISON:  None.  FINDINGS: Right ABI:  Non calculable due to noncompressible vessels.  Left ABI: Calculated at 0.63 based on dorsalis pedis waveform alone. The posterior tibial artery was noncompressible.  Right Lower Extremity: Segmental evaluation shows complete lead noncompressible arteries throughout the right lower extremity, limiting segmental pressure evaluation. Waveform analysis shows biphasic waveforms at the femoral, superficial femoral and popliteal artery levels and essentially monophasic waveforms at the posterior tibial and dorsalis pedis level. Digital waveforms are fairly unremarkable.  Left Lower Extremity: Similar finding of noncompressible arteries throughout the left lower extremity except for the dorsalis pedis artery. Waveform analysis shows monophasic femoral waveform, biphasic superficial femoral and popliteal waveforms and monophasic posterior tibial and dorsalis pedis waveforms. Digital waveforms show some mild depression in the first through third toes moderate depression in the fourth and fifth toes.  IMPRESSION: Limited noninvasive study of the lower extremities due to noncompressible arteries. Ankle-brachial indices are therefore not accurate. Segmental evaluation does suggest the presence of multilevel disease in both lower extremities, likely most significantly in the tibial arteries and digital arteries. If there are significant nonhealing wounds, consider further anatomic vascular imaging study such as CT angiography with lower extremity runoff.   Electronically Signed   By: Irish LackGlenn  Yamagata M.D.   On: 02/03/2014 11:13    Medications:  . carvedilol  12.5 mg Oral BID  . doxercalciferol  0.5 mcg Oral Once per day on Mon Wed Fri  . epoetin alfa  10,000 Units Intravenous Q T,Th,Sa-HD  . heparin  5,000 Units Subcutaneous 3 times per day  . hydrALAZINE  50 mg Oral QID  . insulin aspart  0-15 Units Subcutaneous TID WC  . insulin  aspart  0-5 Units Subcutaneous QHS  . isosorbide mononitrate  60 mg Oral Daily  . pantoprazole  40 mg Oral Q1200  . piperacillin-tazobactam (ZOSYN)  IV  2.25 g Intravenous Q8H  . sevelamer carbonate  2,400 mg Oral TID WC  . silver sulfADIAZINE   Topical Daily  . vancomycin  500 mg Intravenous Q T,Th,Sa-HD        Assessment/Plan: 1. Ulcerated lesion on left lower leg probably infected hematoma-plan to continue IV vancomycin and Zosyn dosed by pharmacy and patient will have wound VAC for Ultram left lower leg  2. End-stage renal failure-patient is to have dialysis today is being seen by nephrology  3. Diabetes mellitus type 2 with adequate control plan to continue current insulin dosages  4. Hypertension essential plan to continue current medications  5. Systolic and diastolic congestive heart failure with elevated troponin-plan to continue carvedilol, furosemide if needed. Dialysis helps as well.   LOS: 4 days   Analaura Messler G 02/05/2014, 6:23 AM

## 2014-02-05 NOTE — Consult Note (Signed)
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 Seen by surgery and cleared to initiate VAC NPWT.  Wound type: Full thickness neuropathic ulcer.   Measurement: 5.5 cm x 2.5 cm x 1.2 cm with undermining extending  2 cm circumferentially. Wound bed: Ruddy red, bleeds easily.   Drainage (amount, consistency, odor) Minimal, serosanguinous drainage noted. No odor.  Periwound: Intact Dressing procedure/placement/frequency: Cleanse left lower leg ulcer with NS and pat gently dry.  Apply nonadherent contact layer to wound bed, silicone contact layer used ([place]son# [id number]).  Pack wound with black granufoam, ensuring that the undermining is filled as well.  Cover with adherent VAC drape and secure TRAC pad.  Change Tues/Thurs/Sat.  Bedside  Nurse may perform dressing changes.  Conservative sharp wound debridement (CSWD performed at the bedside): Seen by surgery and no debridement needed at this time.  Will not follow at this time.  Please re-consult if needed.  Maple HudsonKaren Basir Niven RN BSN CWON Pager [phone]

## 2014-02-06 LAB — CULTURE, BLOOD (ROUTINE X 2)
Culture: NO GROWTH
Culture: NO GROWTH

## 2014-02-06 LAB — GLUCOSE, CAPILLARY
GLUCOSE-CAPILLARY: 107 mg/dL — AB (ref 70–99)
GLUCOSE-CAPILLARY: 114 mg/dL — AB (ref 70–99)
GLUCOSE-CAPILLARY: 120 mg/dL — AB (ref 70–99)

## 2014-02-06 NOTE — Progress Notes (Signed)
Family has left already, not knowing when EMS would come but they are here now and patient is leaving the floor. 2200 meds given before she left Bennett ScrapeAlicia Wright RN

## 2014-02-06 NOTE — Progress Notes (Signed)
Subjective: Interval History: Patient denies any difficulty in brathing  Objective: Vital signs in last 24 hours: Temp:  [98.7 F (37.1 C)-98.9 F (37.2 C)] 98.9 F (37.2 C) (10/09 0526) Pulse Rate:  [58-62] 58 (10/09 0526) Resp:  [14-18] 16 (10/09 0526) BP: (78-124)/(30-53) 97/52 mmHg (10/09 0526) SpO2:  [94 %-96 %] 96 % (10/09 0526) Weight:  [52.4 kg (115 lb 8.3 oz)] 52.4 kg (115 lb 8.3 oz) (10/08 1105) Weight change: 0.9 kg (1 lb 15.8 oz)  Intake/Output from previous day: 10/08 0701 - 10/09 0700 In: 250 [IV Piggyback:250] Out: 1933 [Urine:900] Intake/Output this shift:    Generally she's alert and in no apparent distress.  Chest is clear to auscultation. No rales or rhonchi Heart exam regular rate and rhythm no murmur Abdomen soft positive bowel sound Extremities no edema. Patient has has wound he him heVAC on her left leg  Lab Results:  Recent Labs  02/05/14 0526  WBC 7.0  HGB 10.1*  HCT 30.6*  PLT 313   BMET:   Recent Labs  02/05/14 0526  NA 137  K 3.8  CL 94*  CO2 27  GLUCOSE 104*  BUN 38*  CREATININE 5.56*  CALCIUM 8.4   No results found for this basename: PTH,  in the last 72 hours Iron Studies: No results found for this basename: IRON, TIBC, TRANSFERRIN, FERRITIN,  in the last 72 hours  Studies/Results: No results found.  I have reviewed the patient's current medications.  Assessment/Plan: Problem #1 cellulitis of left leg: s/p debridement on wound VAC . Patient also on antibiotics . She is afebrile with normal blood count. Problem #2 end-stage renal disease: She status post hemodialysis yesterday. Her potassium is normal; and she is asympomatic Problem #3 diabetes Problem #4 hypertension: Her blood pressure is reasonably controlled Problem #5 metabolic bone disease: Calcium and phosphorus is range. She is on binder Problem #6 anemia: Her hemoglobin and hematocrit is with in our target goal Problem #7 fluid management: Patient doesn't have  any sign of fluid overload. Plan:Her next dialysis will be tomorrow and if patient is going to be discharged she will get her dialysis ans an out patient.   LOS: 5 days   Eiley Mcginnity S 02/06/2014,8:29 AM

## 2014-02-06 NOTE — Progress Notes (Signed)
Spoke with Dr Renard MatterMcInnis.  No antibiotics intended for discharge.  Discharge med list updated.    Junita PushMichelle Hershall Benkert, PharmD, BCPS 02/06/2014@11 :30 AM

## 2014-02-06 NOTE — Progress Notes (Signed)
Calling EMS now to verify they are aware to pick up patient. Secretary on phone with EMS now. Bennett ScrapeAlicia Wright RN

## 2014-02-06 NOTE — Clinical Social Work Note (Signed)
CSW arranged transport [facility]. Verified address and that family would be present upon arrival.   Derenda FennelKara Oluwadamilola [place], [place] [phone]

## 2014-02-06 NOTE — Progress Notes (Signed)
EMS stated they are backed up with several people waiting to be picked up, family was informed.  Bennett ScrapeAlicia Wright RN

## 2014-02-06 NOTE — Discharge Summary (Signed)
Physician Discharge Summary  KOLBIE CLARKSTON AVW:[id number] DOB: 1946-03-13 DOA: 02/01/2014  PCP: Alice Reichert, MD  Admit date: 02/01/2014 Discharge date: 02/06/2014     Discharge Diagnoses:  1. Infected hematoma left lower leg-cellulitis 2. End-stage renal failure 3. Diabetes mellitus type 2 4. Hypertension essential Systolic and diastolic congestive heart failure  Discharge Condition: Stable Disposition: Home  Diet recommendation: 2 g low sodium Filed Weights   02/04/14 0630 02/05/14 0442 02/05/14 1105  Weight: 51.03 kg (112 lb 8 oz) 51.5 kg (113 lb 8.6 oz) 52.4 kg (115 lb 8.3 oz)    History of present illness:  The patient is on hemodialysis. She apparently had injury to the left lower leg. This developed into hematoma which became infected. She presented to the emergency room in this fashion. There has been increased drainage and odor. The patient was started empirically on vancomycin and Zosyn and admitted to the hospital for further care  Hospital Course:  The patient was maintained on intravenous fluids throughout the hospital course. She was started on vancomycin and Zosyn and dosed by pharmacy. She was seen both by surgery and by nephrology. She did have dialysis on 2 occasions while in the hospital. On examination it was noted patient had a 5.5 x 2.5 x 1.2 cm wound on lower lateral left leg. Surgery did see patient i and debridement was accomplished. Wound VAC was started . The patient was maintained on the following medications and on sliding scale insulin. Her congestive heart failure was stable and she was continued on carvedilol 12.5 mg twice a day and was continued on Catapres and amlodipine and Apresoline for blood pressure control. She was also continued on medications listed below. It was felt that the patient could be discharged home and wound can be dressed there home health nursing   Medication List         amLODipine 10 MG tablet  Commonly known as:  NORVASC   Take 10 mg by mouth daily.     b complex-vitamin c-folic acid 0.8 MG Tabs tablet  Take 1 tablet by mouth daily.     carvedilol 12.5 MG tablet  Commonly known as:  COREG  Take 12.5 mg by mouth 2 (two) times daily.     cloNIDine 0.1 MG tablet  Commonly known as:  CATAPRES  Take 0.1 mg by mouth 2 (two) times daily. Take before dialysis     hydrALAZINE 50 MG tablet  Commonly known as:  APRESOLINE  Take 50 mg by mouth 4 (four) times daily.     HYDROcodone-acetaminophen 5-325 MG per tablet  Commonly known as:  NORCO/VICODIN  Take 1 tablet by mouth every 6 (six) hours as needed (pain).     isosorbide mononitrate 60 MG 24 hr tablet  Commonly known as:  IMDUR  Take 60 mg by mouth daily.     meclizine 12.5 MG tablet  Commonly known as:  ANTIVERT  Take 12.5 mg by mouth every 4 (four) hours as needed for dizziness.     NEPRO Liqd  Take 1 Can by mouth 2 (two) times daily.     pantoprazole 40 MG tablet  Commonly known as:  PROTONIX  Take 40 mg by mouth daily.     sevelamer carbonate 800 MG tablet  Commonly known as:  RENVELA  Take 800-2,400 mg by mouth 3 (three) times daily with meals. Patient takes 3 tabs with meals and 1 tab with snack     sulfamethoxazole-trimethoprim 800-160 MG per tablet  Commonly  known as:  BACTRIM DS  Take 1 tablet by mouth daily. Starting 01/20/2014 x 7 days.       No Known Allergies  The results of significant diagnostics from this hospitalization (including imaging, microbiology, ancillary and laboratory) are listed below for reference.    Significant Diagnostic Studies: Dg Tibia/fibula Left  02/01/2014   CLINICAL DATA:  worsening wound to her left lower medial leg. She has treated the wound with bactrim without resolution; the bactrim was prescribed 9/22 and finished 9/30. Her husband reports the wound is associated with malodor and discharge. No known cause of the wound. No known injury. Hx diabetes, HTN, Pt is on dialysis  EXAM: LEFT TIBIA AND  FIBULA - 2 VIEW  COMPARISON:  None.  FINDINGS: There is no evidence of fracture or other focal bone lesions. Soft tissue defect at the posteromedial aspect of the distal tibial shaft. No subcutaneous gas. No focal cortical destruction. No radiodense foreign body. Extensive arterial calcifications.  IMPRESSION: Soft tissue defect without bone abnormality.   Electronically Signed   By: Oley Balmaniel  Hassell M.D.   On: 02/01/2014 11:31   [place] Arterial Seg Multiple  02/03/2014   CLINICAL DATA:  Left lower extremity ulcerations. History of diabetes, hypertension, hyperlipidemia and end-stage renal disease.  EXAM: NONINVASIVE PHYSIOLOGIC VASCULAR STUDY OF BILATERAL LOWER EXTREMITIES  TECHNIQUE: Evaluation of both lower extremities was performed at rest, including calculation of ankle-brachial indices, multiple segmental pressure evaluation, segmental Doppler and segmental pulse volume recording.  COMPARISON:  None.  FINDINGS: Right ABI:  Non calculable due to noncompressible vessels.  Left ABI: Calculated at 0.63 based on dorsalis pedis waveform alone. The posterior tibial artery was noncompressible.  Right Lower Extremity: Segmental evaluation shows complete lead noncompressible arteries throughout the right lower extremity, limiting segmental pressure evaluation. Waveform analysis shows biphasic waveforms at the femoral, superficial femoral and popliteal artery levels and essentially monophasic waveforms at the posterior tibial and dorsalis pedis level. Digital waveforms are fairly unremarkable.  Left Lower Extremity: Similar finding of noncompressible arteries throughout the left lower extremity except for the dorsalis pedis artery. Waveform analysis shows monophasic femoral waveform, biphasic superficial femoral and popliteal waveforms and monophasic posterior tibial and dorsalis pedis waveforms. Digital waveforms show some mild depression in the first through third toes moderate depression in the fourth and fifth toes.   IMPRESSION: Limited noninvasive study of the lower extremities due to noncompressible arteries. Ankle-brachial indices are therefore not accurate. Segmental evaluation does suggest the presence of multilevel disease in both lower extremities, likely most significantly in the tibial arteries and digital arteries. If there are significant nonhealing wounds, consider further anatomic vascular imaging study such as CT angiography with lower extremity runoff.   Electronically Signed   By: Irish LackGlenn  Yamagata M.D.   On: 02/03/2014 11:13    Microbiology: Recent Results (from the past 240 hour(s))  CULTURE, BLOOD (ROUTINE X 2)     Status: None   Collection Time    02/01/14 10:40 AM      Result Value Ref Range Status   Specimen Description BLOOD RIGHT ARM   Final   Special Requests     Final   Value: BOTTLES DRAWN AEROBIC AND ANAEROBIC 8CC EACH BOTTLE   Culture NO GROWTH 4 DAYS   Final   Report Status PENDING   Incomplete  CULTURE, BLOOD (ROUTINE X 2)     Status: None   Collection Time    02/01/14 10:44 AM      Result Value Ref Range Status  Specimen Description BLOOD RIGHT HAND   Final   Special Requests BOTTLES DRAWN AEROBIC ONLY 8CC BOTTLE   Final   Culture NO GROWTH 4 DAYS   Final   Report Status PENDING   Incomplete  MRSA PCR SCREENING     Status: None   Collection Time    02/01/14  4:30 PM      Result Value Ref Range Status   MRSA by PCR NEGATIVE  NEGATIVE Final   Comment:            The GeneXpert MRSA Assay (FDA     approved for NASAL specimens     only), is one component of a     comprehensive MRSA colonization     surveillance program. It is not     intended to diagnose MRSA     infection nor to guide or     monitor treatment for     MRSA infections.  CLOSTRIDIUM DIFFICILE BY PCR     Status: None   Collection Time    02/02/14 10:31 AM      Result Value Ref Range Status   C difficile by pcr NEGATIVE  NEGATIVE Final     Labs: Basic Metabolic Panel:  Recent Labs Lab  02/01/14 1001 02/02/14 0517 02/03/14 0500 02/05/14 0526  NA 139 136* 135* 137  K 4.0 3.8 3.5* 3.8  CL 96 96 94* 94*  CO2 29 26 25 27   GLUCOSE 95 81 137* 104*  BUN 32* 46* 61* 38*  CREATININE 3.72* 4.95* 6.36* 5.56*  CALCIUM 9.0 8.2* 8.1* 8.4  PHOS  --   --  4.8*  --    Liver Function Tests:  Recent Labs Lab 02/01/14 1001 02/02/14 0517  AST 18 12  ALT 11 7  ALKPHOS 92 68  BILITOT 0.6 0.5  PROT 7.6 6.2  ALBUMIN 3.7 2.9*   No results found for this basename: LIPASE, AMYLASE,  in the last 168 hours No results found for this basename: AMMONIA,  in the last 168 hours CBC:  Recent Labs Lab 02/01/14 1001 02/02/14 0517 02/03/14 0500 02/05/14 0526  WBC 6.8 6.6 7.3 7.0  NEUTROABS 4.9  --   --   --   HGB 12.1 9.4* 9.4* 10.1*  HCT 36.2 27.5* 27.4* 30.6*  MCV 93.8 93.2 92.3 94.2  PLT 322 303 277 313   Cardiac Enzymes: No results found for this basename: CKTOTAL, CKMB, CKMBINDEX, TROPONINI,  in the last 168 hours BNP: BNP (last 3 results)  Recent Labs  02/03/14 0649  PROBNP >70000.0*   CBG:  Recent Labs Lab 02/04/14 1628 02/04/14 2043 02/05/14 0733 02/05/14 1616 02/05/14 2009  GLUCAP 137* 110* 124* 188* 181*    Active Problems:   Wound infection   Cellulitis   Time coordinating discharge: 45 minutes Signed:  Butch PennyAngus Mateen Franssen, MD 02/06/2014, 6:21 AM

## 2014-02-06 NOTE — Progress Notes (Signed)
Pt being turned to be cleaned up following a bowel movement and there was a small amount of watery blood. Pt in no distress. MD paged and ordered to monitor patient

## 2014-04-03 ENCOUNTER — Ambulatory Visit: Payer: Self-pay | Admitting: Vascular Surgery

## 2014-04-03 LAB — COMPREHENSIVE METABOLIC PANEL
ALK PHOS: 70 U/L
Albumin: 3.3 g/dL — ABNORMAL LOW (ref 3.4–5.0)
Anion Gap: 12 (ref 7–16)
BUN: 59 mg/dL — ABNORMAL HIGH (ref 7–18)
Bilirubin,Total: 0.7 mg/dL (ref 0.2–1.0)
CHLORIDE: 101 mmol/L (ref 98–107)
Calcium, Total: 8 mg/dL — ABNORMAL LOW (ref 8.5–10.1)
Co2: 27 mmol/L (ref 21–32)
Creatinine: 5.36 mg/dL — ABNORMAL HIGH (ref 0.60–1.30)
EGFR (Non-African Amer.): 8 — ABNORMAL LOW
GFR CALC AF AMER: 10 — AB
GLUCOSE: 95 mg/dL (ref 65–99)
OSMOLALITY: 296 (ref 275–301)
Potassium: 3.9 mmol/L (ref 3.5–5.1)
SGOT(AST): 14 U/L — ABNORMAL LOW (ref 15–37)
SGPT (ALT): 21 U/L
Sodium: 140 mmol/L (ref 136–145)
Total Protein: 7 g/dL (ref 6.4–8.2)

## 2014-04-03 LAB — CBC WITH DIFFERENTIAL/PLATELET
Basophil #: 0.1 10*3/uL (ref 0.0–0.1)
Basophil %: 0.8 %
EOS ABS: 0.1 10*3/uL (ref 0.0–0.7)
Eosinophil %: 0.6 %
HCT: 35.4 % (ref 35.0–47.0)
HGB: 11.8 g/dL — ABNORMAL LOW (ref 12.0–16.0)
Lymphocyte #: 1.5 10*3/uL (ref 1.0–3.6)
Lymphocyte %: 14.9 %
MCH: 32.4 pg (ref 26.0–34.0)
MCHC: 33.4 g/dL (ref 32.0–36.0)
MCV: 97 fL (ref 80–100)
Monocyte #: 0.8 x10 3/mm (ref 0.2–0.9)
Monocyte %: 8.2 %
NEUTROS ABS: 7.6 10*3/uL — AB (ref 1.4–6.5)
NEUTROS PCT: 75.5 %
Platelet: 242 10*3/uL (ref 150–440)
RBC: 3.65 10*6/uL — AB (ref 3.80–5.20)
RDW: 13.9 % (ref 11.5–14.5)
WBC: 10 10*3/uL (ref 3.6–11.0)

## 2014-04-03 LAB — TSH: THYROID STIMULATING HORM: 2.53 u[IU]/mL

## 2014-04-03 LAB — AMMONIA: Ammonia, Plasma: 13 mcmol/L (ref 11–32)

## 2014-04-11 ENCOUNTER — Emergency Department (HOSPITAL_COMMUNITY): Payer: Medicare Other

## 2014-04-11 ENCOUNTER — Emergency Department (HOSPITAL_COMMUNITY)
Admission: EM | Admit: 2014-04-11 | Discharge: 2014-04-11 | Disposition: A | Discharge status: Home / Self Care | Payer: Medicare Other | Attending: Emergency Medicine | Admitting: Emergency Medicine

## 2014-04-11 ENCOUNTER — Encounter (HOSPITAL_COMMUNITY): Payer: Self-pay | Admitting: Emergency Medicine

## 2014-04-11 DIAGNOSIS — Y9389 Activity, other specified: Secondary | ICD-10-CM | POA: Diagnosis not present

## 2014-04-11 DIAGNOSIS — R011 Cardiac murmur, unspecified: Secondary | ICD-10-CM | POA: Insufficient documentation

## 2014-04-11 DIAGNOSIS — W07XXXA Fall from chair, initial encounter: Secondary | ICD-10-CM | POA: Insufficient documentation

## 2014-04-11 DIAGNOSIS — Y92019 Unspecified place in single-family (private) house as the place of occurrence of the external cause: Secondary | ICD-10-CM | POA: Diagnosis not present

## 2014-04-11 DIAGNOSIS — Z79899 Other long term (current) drug therapy: Secondary | ICD-10-CM | POA: Insufficient documentation

## 2014-04-11 DIAGNOSIS — Y998 Other external cause status: Secondary | ICD-10-CM | POA: Insufficient documentation

## 2014-04-11 DIAGNOSIS — Z043 Encounter for examination and observation following other accident: Secondary | ICD-10-CM | POA: Insufficient documentation

## 2014-04-11 DIAGNOSIS — Z87440 Personal history of urinary (tract) infections: Secondary | ICD-10-CM | POA: Diagnosis not present

## 2014-04-11 DIAGNOSIS — E119 Type 2 diabetes mellitus without complications: Secondary | ICD-10-CM | POA: Diagnosis not present

## 2014-04-11 DIAGNOSIS — Z992 Dependence on renal dialysis: Secondary | ICD-10-CM | POA: Insufficient documentation

## 2014-04-11 DIAGNOSIS — K219 Gastro-esophageal reflux disease without esophagitis: Secondary | ICD-10-CM | POA: Insufficient documentation

## 2014-04-11 DIAGNOSIS — I10 Essential (primary) hypertension: Secondary | ICD-10-CM | POA: Insufficient documentation

## 2014-04-11 DIAGNOSIS — W19XXXA Unspecified fall, initial encounter: Secondary | ICD-10-CM

## 2014-04-11 DIAGNOSIS — I6782 Cerebral ischemia: Secondary | ICD-10-CM | POA: Insufficient documentation

## 2014-04-11 LAB — COMPREHENSIVE METABOLIC PANEL
ALK PHOS: 69 U/L (ref 39–117)
ALT: 9 U/L (ref 0–35)
AST: 22 U/L (ref 0–37)
Albumin: 3.4 g/dL — ABNORMAL LOW (ref 3.5–5.2)
Anion gap: 14 (ref 5–15)
BUN: 31 mg/dL — ABNORMAL HIGH (ref 6–23)
CO2: 30 meq/L (ref 19–32)
Calcium: 8.8 mg/dL (ref 8.4–10.5)
Chloride: 94 mEq/L — ABNORMAL LOW (ref 96–112)
Creatinine, Ser: 3.53 mg/dL — ABNORMAL HIGH (ref 0.50–1.10)
GFR, EST AFRICAN AMERICAN: 14 mL/min — AB (ref >90)
GFR, EST NON AFRICAN AMERICAN: 12 mL/min — AB (ref >90)
GLUCOSE: 84 mg/dL (ref 70–99)
POTASSIUM: 4.2 meq/L (ref 3.7–5.3)
Sodium: 138 mEq/L (ref 137–147)
Total Bilirubin: 0.5 mg/dL (ref 0.3–1.2)
Total Protein: 7 g/dL (ref 6.0–8.3)

## 2014-04-11 LAB — CBC WITH DIFFERENTIAL/PLATELET
Basophils Absolute: 0 10*3/uL (ref 0.0–0.1)
Basophils Relative: 0 % (ref 0–1)
EOS PCT: 2 % (ref 0–5)
Eosinophils Absolute: 0.1 10*3/uL (ref 0.0–0.7)
HCT: 31.7 % — ABNORMAL LOW (ref 36.0–46.0)
Hemoglobin: 10.6 g/dL — ABNORMAL LOW (ref 12.0–15.0)
LYMPHS ABS: 1 10*3/uL (ref 0.7–4.0)
LYMPHS PCT: 20 % (ref 12–46)
MCH: 32.1 pg (ref 26.0–34.0)
MCHC: 33.4 g/dL (ref 30.0–36.0)
MCV: 96.1 fL (ref 78.0–100.0)
Monocytes Absolute: 0.8 10*3/uL (ref 0.1–1.0)
Monocytes Relative: 15 % — ABNORMAL HIGH (ref 3–12)
NEUTROS ABS: 3.3 10*3/uL (ref 1.7–7.7)
NEUTROS PCT: 63 % (ref 43–77)
Platelets: 230 10*3/uL (ref 150–400)
RBC: 3.3 MIL/uL — AB (ref 3.87–5.11)
RDW: 13 % (ref 11.5–15.5)
WBC: 5.3 10*3/uL (ref 4.0–10.5)

## 2014-04-11 LAB — URINALYSIS, ROUTINE W REFLEX MICROSCOPIC
Bilirubin Urine: NEGATIVE
GLUCOSE, UA: 100 mg/dL — AB
HGB URINE DIPSTICK: NEGATIVE
KETONES UR: NEGATIVE mg/dL
LEUKOCYTES UA: NEGATIVE
Nitrite: NEGATIVE
Protein, ur: 100 mg/dL — AB
Specific Gravity, Urine: 1.02 (ref 1.005–1.030)
Urobilinogen, UA: 0.2 mg/dL (ref 0.0–1.0)
pH: 8 (ref 5.0–8.0)

## 2014-04-11 LAB — TROPONIN I: Troponin I: <0.3 ng/mL (ref <0.30)

## 2014-04-11 LAB — URINE MICROSCOPIC-ADD ON

## 2014-04-11 NOTE — Discharge Instructions (Signed)
Fall Prevention and Home Safety °Falls cause injuries and can affect all age groups. It is possible to prevent falls.  °HOW TO PREVENT FALLS °· Wear shoes with rubber soles that do not have an opening for your toes. °· Keep the inside and outside of your house well lit. °· Use night lights throughout your home. °· Remove clutter from floors. °· Clean up floor spills. °· Remove throw rugs or fasten them to the floor with carpet tape. °· Do not place electrical cords across pathways. °· Put grab bars by your tub, shower, and toilet. Do not use towel bars as grab bars. °· Put handrails on both sides of the stairway. Fix loose handrails. °· Do not climb on stools or stepladders, if possible. °· Do not wax your floors. °· Repair uneven or unsafe sidewalks, walkways, or stairs. °· Keep items you use a lot within reach. °· Be aware of pets. °· Keep emergency numbers next to the telephone. °· Put smoke detectors in your home and near bedrooms. °Ask your doctor what other things you can do to prevent falls. °Document Released: 02/11/2009 Document Revised: 10/17/2011 Document Reviewed: 07/18/2011 °Exit[employer]® Patient Information ©2015 [employer]. This information is not intended to replace advice given to you by your health care provider. Make sure you discuss any questions you have with your health care provider. ° °

## 2014-04-11 NOTE — ED Notes (Signed)
MD at bedside. 

## 2014-04-11 NOTE — ED Provider Notes (Signed)
CSN: [id number]     Arrival date & time 04/11/14  1817 History  This chart was scribed for Glynn OctaveStephen Jernee Murtaugh, MD by Modena JanskyAlbert Thayil, ED Scribe. This patient was seen in room [facility]06/[facility]06 and the patient's care was started at 6:30 PM.    Chief Complaint  Patient presents with  . Fall   The history is provided by the patient. No language interpreter was used.    Level 5 Caveat due to possible AMS   HPI Comments: [name] is a 68 y.o. female who presents to the Emergency Department complaining of a fall that occurred today. EMS states that pt fell at home. Pt responds to question by shaking head. Pt denies any pain. Oriented to person and place. She does not ambulate at baseline is wheelchair bound. Fall was witnessed by patient's sister who was in the other room. Patient was sitting in a chair and fell forward striking her head. Did not lose consciousness. She appears to be at her baseline mental status.    Past Medical History  Diagnosis Date  . Type 2 diabetes mellitus   . Hypercholesteremia   . Essential hypertension, benign   . UTI (urinary tract infection)   . Ruptured appendix   . Iron deficiency anemia   . Noncompliance   . Renal insufficiency     Dialysis Tues, Thurs, & Sat  . Dialysis patient     Tuesday, Thursday and Saturday   . GERD (gastroesophageal reflux disease)   . 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 Appendectomy  2008    Exploratory laparotomy  . Left carpal tunnel sugrey    . Stitch granuloma excision  2009  . Esophagogastroduodenoscopy  01/19/2011    Procedure: ESOPHAGOGASTRODUODENOSCOPY (EGD);  Surgeon: Malissa HippoNajeeb U Rehman, MD;  Location: [facility];  Service: Endoscopy;  Laterality: N/A;  . Abdominal hysterectomy    . Tonsillectomy    . Cataract extraction w/phaco  07/10/2011    Procedure: CATARACT EXTRACTION PHACO AND INTRAOCULAR LENS PLACEMENT (IOC);  Surgeon: Gemma PayorKerry Hunt, MD;  Location: [facility]  ORS;  Service: Ophthalmology;  Laterality: Left;  CDE: 20.36  . Cataract extraction w/phaco  08/07/2011    Procedure: CATARACT EXTRACTION PHACO AND INTRAOCULAR LENS PLACEMENT (IOC);  Surgeon: Gemma PayorKerry Hunt, MD;  Location: [facility];  Service: Ophthalmology;  Laterality: Right;  CDE:15.31   Family History  Problem Relation Age of Onset  . Hypertension    . Anesthesia problems Neg Hx   . Hypotension Neg Hx   . Malignant hyperthermia Neg Hx   . Pseudochol deficiency Neg Hx    History  Substance Use Topics  . Smoking status: Never Smoker   . Smokeless tobacco: Never Used  . Alcohol Use: No   OB History    No data available     Review of Systems A complete 10 system review of systems was obtained and all systems are negative except as noted in the HPI and PMH.   Allergies  Review of patient's allergies indicates no known allergies.  Home Medications   Prior to Admission medications   Medication Sig Start Date End Date Taking? Authorizing Provider  amLODipine (NORVASC) 10 MG tablet Take 10 mg by mouth daily.    Historical Provider, MD  b complex-vitamin c-folic acid (NEPHRO-VITE) 0.8 MG TABS tablet Take 1 tablet by mouth daily.    Historical Provider, MD  carvedilol (COREG) 12.5 MG tablet  Take 12.5 mg by mouth 2 (two) times daily.    Historical Provider, MD  cloNIDine (CATAPRES) 0.1 MG tablet Take 0.1 mg by mouth 2 (two) times daily. Take before dialysis    Historical Provider, MD  hydrALAZINE (APRESOLINE) 50 MG tablet Take 50 mg by mouth 4 (four) times daily. 01/24/11 04/15/13  Alice Reichert, MD  HYDROcodone-acetaminophen (NORCO/VICODIN) 5-325 MG per tablet Take 1 tablet by mouth every 6 (six) hours as needed (pain).    Historical Provider, MD  isosorbide mononitrate (IMDUR) 60 MG 24 hr tablet Take 60 mg by mouth daily.    Historical Provider, MD  meclizine (ANTIVERT) 12.5 MG tablet Take 12.5 mg by mouth every 4 (four) hours as needed for dizziness.    Historical Provider, MD  Nutritional  Supplements (NEPRO) LIQD Take 1 Can by mouth 2 (two) times daily.    Historical Provider, MD  pantoprazole (PROTONIX) 40 MG tablet Take 40 mg by mouth daily.    Historical Provider, MD  sevelamer carbonate (RENVELA) 800 MG tablet Take 800-2,400 mg by mouth 3 (three) times daily with meals. Patient takes 3 tabs with meals and 1 tab with snack    Historical Provider, MD   BP 122/64 mmHg  Pulse 64  Temp(Src) 98 F (36.7 C) (Oral)  Resp 20  Ht 5\' 4"  (1.626 m)  Wt 115 lb (52.164 kg)  BMI 19.73 kg/m2  SpO2 94% Physical Exam  Constitutional: She is oriented to person, place, and time. She appears well-developed and well-nourished. No distress.  Minimally verbal. Answers yes and no questions.  HENT:  Head: Normocephalic and atraumatic.  Mouth/Throat: Oropharynx is clear and moist. No oropharyngeal exudate.  Eyes: Conjunctivae and EOM are normal. Pupils are equal, round, and reactive to light. Right eye exhibits no discharge. Left eye exhibits no discharge.  Neck: Normal range of motion. Neck supple.  No meningismus.  Cardiovascular: Normal rate, regular rhythm and intact distal pulses.   Murmur heard. Systolic murmur present.   Pulmonary/Chest: Effort normal and breath sounds normal. No respiratory distress.  Abdominal: Soft. There is no tenderness. There is no rebound and no guarding.  Musculoskeletal: Normal range of motion. She exhibits no edema or tenderness.  No C, T or L spine TTP.   Neurological: She is alert and oriented to person, place, and time. No cranial nerve deficit. She exhibits normal muscle tone. Coordination normal.  Moving all extremities.   Skin: Skin is warm.  No evidence of trauma. Left upper arm with fistula with thrill and bruit intact.   Psychiatric: She has a normal mood and affect. Her behavior is normal.  Nursing note and vitals reviewed.   ED Course  Procedures (including critical care time) DIAGNOSTIC STUDIES: Oxygen Saturation is 94% on RA, normal by my  interpretation.    COORDINATION OF CARE: 6:34 PM- Pt advised of plan for treatment which includes radiology and labs and pt agrees.  Labs Review Labs Reviewed  CBC WITH DIFFERENTIAL - Abnormal; Notable for the following:    RBC 3.30 (*)    Hemoglobin 10.6 (*)    HCT 31.7 (*)    Monocytes Relative 15 (*)    All other components within normal limits  COMPREHENSIVE METABOLIC PANEL - Abnormal; Notable for the following:    Chloride 94 (*)    BUN 31 (*)    Creatinine, Ser 3.53 (*)    Albumin 3.4 (*)    GFR calc non Af Amer 12 (*)    GFR calc Af  Amer 14 (*)    All other components within normal limits  URINALYSIS, ROUTINE W REFLEX MICROSCOPIC - Abnormal; Notable for the following:    Glucose, UA 100 (*)    Protein, ur 100 (*)    All other components within normal limits  TROPONIN I  TROPONIN I  URINE MICROSCOPIC-ADD ON    Imaging Review Dg Chest 1 View  04/11/2014   CLINICAL DATA:  Recent fall today, initial encounter  EXAM: CHEST - 1 VIEW  COMPARISON:  01/23/2011  FINDINGS: Cardiac shadow is mildly enlarged. A left arm stent is noted. Chronic changes in the right base are noted. No focal infiltrate or sizable effusion is seen. No bony abnormality is noted.  IMPRESSION: No acute abnormality noted.   Electronically Signed   By: Alcide CleverMark  Lukens M.D.   On: 04/11/2014 20:11   Ct Head Wo Contrast  04/11/2014   CLINICAL DATA:  Fall today.  EXAM: CT HEAD WITHOUT CONTRAST  CT CERVICAL SPINE WITHOUT CONTRAST  TECHNIQUE: Multidetector CT imaging of the head and cervical spine was performed following the standard protocol without intravenous contrast. Multiplanar CT image reconstructions of the cervical spine were also generated.  COMPARISON:  Head CT 10/31/2013 as well as head and cervical spine CT 01/31/2011  FINDINGS: CT HEAD FINDINGS  The ventricles, cisterns and other CSF spaces are within normal. There is moderate chronic ischemic microvascular disease. Small old lacune infarct over the region  of the right thalamus and inferior right lentiform nucleus. No mass, mass effect, shift of midline structures or acute hemorrhage. No evidence to suggest acute infarction. Remaining bones and soft tissues are within normal.  CT CERVICAL SPINE FINDINGS  Examination demonstrates 2-3 mm anterior subluxation of C6 on C7 without significant change. There is a large Schmorl's node extending from the inferior endplate of C6 into the vertebral body without significant change. There is mild progression of moderate to space narrowing of the C6-7 level with slight progressive anterior tilting of the C6 vertebral body. There is mild spondylosis of the cervical spine. There is possible ankylosis of the C6-7 facets. Prevertebral soft tissues as well as the atlantoaxial articulation are unremarkable. There is mild uncovertebral joint spurring and facet arthropathy. There is no acute fracture. Remainder of the exam is unremarkable.  IMPRESSION: No acute intracranial findings.  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  No acute cervical spine injury.  Mild spondylosis of the cervical spine. There is moderate to severe progression of disc space narrowing at the C6-7 level with progressive anterior tilting of the C6 vertebral body and stable 2-3 mm anterior subluxation of C6 on C7. Large Schmorl's node within the C6 vertebral body extending from the inferior endplate unchanged.   Electronically Signed   By: Elberta Fortisaniel  Boyle M.D.   On: 04/11/2014 20:00   Ct Cervical Spine Wo Contrast  04/11/2014   CLINICAL DATA:  Fall today.  EXAM: CT HEAD WITHOUT CONTRAST  CT CERVICAL SPINE WITHOUT CONTRAST  TECHNIQUE: Multidetector CT imaging of the head and cervical spine was performed following the standard protocol without intravenous contrast. Multiplanar CT image reconstructions of the cervical spine were also generated.  COMPARISON:  Head CT 10/31/2013 as well as head and  cervical spine CT 01/31/2011  FINDINGS: CT HEAD FINDINGS  The ventricles, cisterns and other CSF spaces are within normal. There is moderate chronic ischemic microvascular disease. Small old lacune infarct over the region of the right thalamus and inferior right lentiform  nucleus. No mass, mass effect, shift of midline structures or acute hemorrhage. No evidence to suggest acute infarction. Remaining bones and soft tissues are within normal.  CT CERVICAL SPINE FINDINGS  Examination demonstrates 2-3 mm anterior subluxation of C6 on C7 without significant change. There is a large Schmorl's node extending from the inferior endplate of C6 into the vertebral body without significant change. There is mild progression of moderate to space narrowing of the C6-7 level with slight progressive anterior tilting of the C6 vertebral body. There is mild spondylosis of the cervical spine. There is possible ankylosis of the C6-7 facets. Prevertebral soft tissues as well as the atlantoaxial articulation are unremarkable. There is mild uncovertebral joint spurring and facet arthropathy. There is no acute fracture. Remainder of the exam is unremarkable.  IMPRESSION: No acute intracranial findings.  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  No acute cervical spine injury.  Mild spondylosis of the cervical spine. There is moderate to severe progression of disc space narrowing at the C6-7 level with progressive anterior tilting of the C6 vertebral body and stable 2-3 mm anterior subluxation of C6 on C7. Large Schmorl's node within the C6 vertebral body extending from the inferior endplate unchanged.   Electronically Signed   By: Elberta Fortis M.D.   On: 04/11/2014 20:00     EKG Interpretation   Date/Time:  Saturday April 11 2014 19:14:43 EST Ventricular Rate:  64 PR Interval:  189 QRS Duration: 105 QT Interval:  467 QTC Calculation: 482 R Axis:   -13 Text  Interpretation:  Sinus rhythm Probable left atrial enlargement LVH  with secondary repolarization abnormality Probable anterior infarct, age  indeterminate Baseline wander in lead(s) II III aVF more pronounced  lateral T wave inversions Confirmed by Manus Gunning  MD, Aranda Bihm [phone]) on  04/11/2014 7:20:18 PM      MDM   Final diagnoses:  Fall   Patient from home after unwitnessed fall. Patient shakes her head yes and no, minimally verbal. Unsure of baseline. No evidence of trauma.  EKG shows LVH. CT head and C-spine are negative for acute pathology. Chest x-ray negative. No evidence of volume overload.  Labs appear to be baseline. Troponin negative.  Patient's sister confirms patient is at her mental baseline. She is minimally verbal and does not ambulate. She appears stable to return home.  BP 156/65 mmHg  Pulse 62  Temp(Src) 98 F (36.7 C) (Oral)  Resp 20  Ht 5\' 4"  (1.626 m)  Wt 115 lb (52.164 kg)  BMI 19.73 kg/m2  SpO2 97%  I personally performed the services described in this documentation, which was scribed in my presence. The recorded information has been reviewed and is accurate.   Glynn Octave, MD 04/12/14 [phone]

## 2014-04-11 NOTE — ED Notes (Signed)
Fall at home.  Not able to get information from pt at this time. Will obtain information once family arrive.

## 2014-04-11 NOTE — ED Notes (Signed)
Patient unable to ambulate

## 2014-04-11 NOTE — ED Notes (Signed)
Patient able to drink po fluids. 

## 2014-04-11 NOTE — ED Notes (Signed)
NO BP's in LEFT arm.  AV Fistula with +thrill and + bruit.

## 2014-06-06 ENCOUNTER — Inpatient Hospital Stay (HOSPITAL_COMMUNITY): Payer: Medicare Other

## 2014-06-06 ENCOUNTER — Emergency Department (HOSPITAL_COMMUNITY): Payer: Medicare Other

## 2014-06-06 ENCOUNTER — Other Ambulatory Visit: Payer: Self-pay

## 2014-06-06 ENCOUNTER — Inpatient Hospital Stay (HOSPITAL_COMMUNITY)
Admission: EM | Admit: 2014-06-06 | Discharge: 2014-06-30 | DRG: 207 | Disposition: E | Payer: Medicare Other | Attending: Internal Medicine | Admitting: Internal Medicine

## 2014-06-06 ENCOUNTER — Encounter (HOSPITAL_COMMUNITY): Payer: Self-pay | Admitting: Emergency Medicine

## 2014-06-06 DIAGNOSIS — Z66 Do not resuscitate: Secondary | ICD-10-CM | POA: Diagnosis not present

## 2014-06-06 DIAGNOSIS — K7201 Acute and subacute hepatic failure with coma: Secondary | ICD-10-CM | POA: Diagnosis present

## 2014-06-06 DIAGNOSIS — Z79899 Other long term (current) drug therapy: Secondary | ICD-10-CM | POA: Diagnosis not present

## 2014-06-06 DIAGNOSIS — I452 Bifascicular block: Secondary | ICD-10-CM | POA: Diagnosis present

## 2014-06-06 DIAGNOSIS — I248 Other forms of acute ischemic heart disease: Secondary | ICD-10-CM | POA: Diagnosis present

## 2014-06-06 DIAGNOSIS — D509 Iron deficiency anemia, unspecified: Secondary | ICD-10-CM | POA: Diagnosis present

## 2014-06-06 DIAGNOSIS — N186 End stage renal disease: Secondary | ICD-10-CM | POA: Insufficient documentation

## 2014-06-06 DIAGNOSIS — Z79891 Long term (current) use of opiate analgesic: Secondary | ICD-10-CM | POA: Diagnosis not present

## 2014-06-06 DIAGNOSIS — E1122 Type 2 diabetes mellitus with diabetic chronic kidney disease: Secondary | ICD-10-CM | POA: Diagnosis present

## 2014-06-06 DIAGNOSIS — I120 Hypertensive chronic kidney disease with stage 5 chronic kidney disease or end stage renal disease: Secondary | ICD-10-CM | POA: Diagnosis not present

## 2014-06-06 DIAGNOSIS — G931 Anoxic brain damage, not elsewhere classified: Secondary | ICD-10-CM | POA: Diagnosis present

## 2014-06-06 DIAGNOSIS — Z8249 Family history of ischemic heart disease and other diseases of the circulatory system: Secondary | ICD-10-CM | POA: Diagnosis not present

## 2014-06-06 DIAGNOSIS — J9690 Respiratory failure, unspecified, unspecified whether with hypoxia or hypercapnia: Secondary | ICD-10-CM | POA: Insufficient documentation

## 2014-06-06 DIAGNOSIS — Z978 Presence of other specified devices: Secondary | ICD-10-CM

## 2014-06-06 DIAGNOSIS — K439 Ventral hernia without obstruction or gangrene: Secondary | ICD-10-CM | POA: Diagnosis present

## 2014-06-06 DIAGNOSIS — I429 Cardiomyopathy, unspecified: Secondary | ICD-10-CM | POA: Diagnosis present

## 2014-06-06 DIAGNOSIS — J690 Pneumonitis due to inhalation of food and vomit: Secondary | ICD-10-CM | POA: Insufficient documentation

## 2014-06-06 DIAGNOSIS — J9601 Acute respiratory failure with hypoxia: Principal | ICD-10-CM | POA: Diagnosis present

## 2014-06-06 DIAGNOSIS — D638 Anemia in other chronic diseases classified elsewhere: Secondary | ICD-10-CM | POA: Diagnosis present

## 2014-06-06 DIAGNOSIS — T17920A Food in respiratory tract, part unspecified causing asphyxiation, initial encounter: Secondary | ICD-10-CM | POA: Diagnosis present

## 2014-06-06 DIAGNOSIS — Z515 Encounter for palliative care: Secondary | ICD-10-CM

## 2014-06-06 DIAGNOSIS — Z9119 Patient's noncompliance with other medical treatment and regimen: Secondary | ICD-10-CM | POA: Diagnosis present

## 2014-06-06 DIAGNOSIS — W44F3XA Food entering into or through a natural orifice, initial encounter: Secondary | ICD-10-CM | POA: Insufficient documentation

## 2014-06-06 DIAGNOSIS — E43 Unspecified severe protein-calorie malnutrition: Secondary | ICD-10-CM | POA: Diagnosis present

## 2014-06-06 DIAGNOSIS — I469 Cardiac arrest, cause unspecified: Secondary | ICD-10-CM

## 2014-06-06 DIAGNOSIS — Z992 Dependence on renal dialysis: Secondary | ICD-10-CM | POA: Diagnosis not present

## 2014-06-06 DIAGNOSIS — E780 Pure hypercholesterolemia: Secondary | ICD-10-CM | POA: Diagnosis present

## 2014-06-06 DIAGNOSIS — E785 Hyperlipidemia, unspecified: Secondary | ICD-10-CM | POA: Diagnosis present

## 2014-06-06 DIAGNOSIS — G40911 Epilepsy, unspecified, intractable, with status epilepticus: Secondary | ICD-10-CM | POA: Insufficient documentation

## 2014-06-06 DIAGNOSIS — I442 Atrioventricular block, complete: Secondary | ICD-10-CM | POA: Diagnosis not present

## 2014-06-06 DIAGNOSIS — K219 Gastro-esophageal reflux disease without esophagitis: Secondary | ICD-10-CM | POA: Diagnosis present

## 2014-06-06 DIAGNOSIS — N2581 Secondary hyperparathyroidism of renal origin: Secondary | ICD-10-CM | POA: Diagnosis not present

## 2014-06-06 DIAGNOSIS — R0989 Other specified symptoms and signs involving the circulatory and respiratory systems: Secondary | ICD-10-CM | POA: Insufficient documentation

## 2014-06-06 DIAGNOSIS — G40901 Epilepsy, unspecified, not intractable, with status epilepticus: Secondary | ICD-10-CM | POA: Diagnosis present

## 2014-06-06 DIAGNOSIS — Z993 Dependence on wheelchair: Secondary | ICD-10-CM

## 2014-06-06 LAB — APTT: aPTT: 33 seconds (ref 24–37)

## 2014-06-06 LAB — COMPREHENSIVE METABOLIC PANEL
ALT: 190 U/L — AB (ref 0–35)
ALT: 204 U/L — ABNORMAL HIGH (ref 0–35)
AST: 194 U/L — AB (ref 0–37)
AST: 257 U/L — ABNORMAL HIGH (ref 0–37)
Albumin: 3.2 g/dL — ABNORMAL LOW (ref 3.5–5.2)
Albumin: 3.3 g/dL — ABNORMAL LOW (ref 3.5–5.2)
Alkaline Phosphatase: 80 U/L (ref 39–117)
Alkaline Phosphatase: 86 U/L (ref 39–117)
Anion gap: 12 (ref 5–15)
Anion gap: 10 (ref 5–15)
BILIRUBIN TOTAL: 0.9 mg/dL (ref 0.3–1.2)
BUN: 23 mg/dL (ref 6–23)
BUN: 26 mg/dL — AB (ref 6–23)
CALCIUM: 8.3 mg/dL — AB (ref 8.4–10.5)
CO2: 28 mmol/L (ref 19–32)
CO2: 30 mmol/L (ref 19–32)
CREATININE: 3.02 mg/dL — AB (ref 0.50–1.10)
CREATININE: 3.02 mg/dL — AB (ref 0.50–1.10)
Calcium: 7.9 mg/dL — ABNORMAL LOW (ref 8.4–10.5)
Chloride: 100 mmol/L (ref 96–112)
Chloride: 102 mmol/L (ref 96–112)
GFR calc Af Amer: 17 mL/min — ABNORMAL LOW (ref >90)
GFR calc non Af Amer: 15 mL/min — ABNORMAL LOW (ref >90)
GFR, EST AFRICAN AMERICAN: 17 mL/min — AB (ref >90)
GFR, EST NON AFRICAN AMERICAN: 15 mL/min — AB (ref >90)
GLUCOSE: 156 mg/dL — AB (ref 70–99)
Glucose, Bld: 205 mg/dL — ABNORMAL HIGH (ref 70–99)
Potassium: 3.7 mmol/L (ref 3.5–5.1)
Potassium: 4.6 mmol/L (ref 3.5–5.1)
SODIUM: 142 mmol/L (ref 135–145)
Sodium: 140 mmol/L (ref 135–145)
TOTAL PROTEIN: 6.6 g/dL (ref 6.0–8.3)
Total Bilirubin: 1.3 mg/dL — ABNORMAL HIGH (ref 0.3–1.2)
Total Protein: 6.6 g/dL (ref 6.0–8.3)

## 2014-06-06 LAB — TYPE AND SCREEN
ABO/RH(D): O POS
ANTIBODY SCREEN: NEGATIVE

## 2014-06-06 LAB — CBC WITH DIFFERENTIAL/PLATELET
BAND NEUTROPHILS: 0 % (ref 0–10)
BASOS PCT: 0 % (ref 0–1)
BLASTS: 0 %
Basophils Absolute: 0 10*3/uL (ref 0.0–0.1)
Basophils Absolute: 0 10*3/uL (ref 0.0–0.1)
Basophils Relative: 0 % (ref 0–1)
EOS PCT: 3 % (ref 0–5)
Eosinophils Absolute: 0.5 10*3/uL (ref 0.0–0.7)
Eosinophils Absolute: 0 10*3/uL (ref 0.0–0.7)
Eosinophils Relative: 0 % (ref 0–5)
HCT: 31.2 % — ABNORMAL LOW (ref 36.0–46.0)
HCT: 29.1 % — ABNORMAL LOW (ref 36.0–46.0)
Hemoglobin: 10.2 g/dL — ABNORMAL LOW (ref 12.0–15.0)
Hemoglobin: 9.8 g/dL — ABNORMAL LOW (ref 12.0–15.0)
LYMPHS ABS: 3.5 10*3/uL (ref 0.7–4.0)
LYMPHS PCT: 21 % (ref 12–46)
LYMPHS PCT: 5 % — AB (ref 12–46)
Lymphs Abs: 0.6 10*3/uL — ABNORMAL LOW (ref 0.7–4.0)
MCH: 32.3 pg (ref 26.0–34.0)
MCH: 31.5 pg (ref 26.0–34.0)
MCHC: 32.7 g/dL (ref 30.0–36.0)
MCHC: 33.7 g/dL (ref 30.0–36.0)
MCV: 98.7 fL (ref 78.0–100.0)
MCV: 93.6 fL (ref 78.0–100.0)
MONO ABS: 1.5 10*3/uL — AB (ref 0.1–1.0)
MONO ABS: 0.8 10*3/uL (ref 0.1–1.0)
Metamyelocytes Relative: 0 %
Monocytes Relative: 9 % (ref 3–12)
Monocytes Relative: 6 % (ref 3–12)
Myelocytes: 0 %
NEUTROS ABS: 12.5 10*3/uL — AB (ref 1.7–7.7)
NEUTROS PCT: 89 % — AB (ref 43–77)
Neutro Abs: 11.3 10*3/uL — ABNORMAL HIGH (ref 1.7–7.7)
Neutrophils Relative %: 67 % (ref 43–77)
PLATELETS: 279 10*3/uL (ref 150–400)
PROMYELOCYTES ABS: 0 %
Platelets: 301 10*3/uL (ref 150–400)
RBC: 3.16 MIL/uL — ABNORMAL LOW (ref 3.87–5.11)
RBC: 3.11 MIL/uL — ABNORMAL LOW (ref 3.87–5.11)
RDW: 13.6 % (ref 11.5–15.5)
RDW: 13.5 % (ref 11.5–15.5)
WBC: 16.8 10*3/uL — AB (ref 4.0–10.5)
WBC: 13.9 10*3/uL — ABNORMAL HIGH (ref 4.0–10.5)
nRBC: 0 /100 WBC

## 2014-06-06 LAB — POCT I-STAT 3, ART BLOOD GAS (G3+)
ACID-BASE EXCESS: 8 mmol/L — AB (ref 0.0–2.0)
BICARBONATE: 33.6 meq/L — AB (ref 20.0–24.0)
O2 Saturation: 100 %
Patient temperature: 36
TCO2: 35 mmol/L (ref 0–100)
pCO2 arterial: 46.6 mmHg — ABNORMAL HIGH (ref 35.0–45.0)
pH, Arterial: 7.462 — ABNORMAL HIGH (ref 7.350–7.450)
pO2, Arterial: 495 mmHg — ABNORMAL HIGH (ref 80.0–100.0)

## 2014-06-06 LAB — PROTIME-INR
INR: 1.18 (ref 0.00–1.49)
Prothrombin Time: 15.1 seconds (ref 11.6–15.2)

## 2014-06-06 LAB — BLOOD GAS, ARTERIAL
Acid-base deficit: 1.2 mmol/L (ref 0.0–2.0)
Bicarbonate: 23 mEq/L (ref 20.0–24.0)
FIO2: 100 %
MECHVT: 500 mL
O2 SAT: 98 %
PCO2 ART: 38.5 mmHg (ref 35.0–45.0)
PEEP/CPAP: 2 cmH2O
PO2 ART: 505 mmHg — AB (ref 80.0–100.0)
Patient temperature: 37
RATE: 20 resp/min
TCO2: 21.3 mmol/L (ref 0–100)
pH, Arterial: 7.394 (ref 7.350–7.450)

## 2014-06-06 LAB — MAGNESIUM: MAGNESIUM: 2 mg/dL (ref 1.5–2.5)

## 2014-06-06 LAB — TROPONIN I
TROPONIN I: 0.38 ng/mL — AB (ref <0.031)
Troponin I: 0.09 ng/mL — ABNORMAL HIGH (ref <0.031)

## 2014-06-06 LAB — FIBRINOGEN: FIBRINOGEN: 370 mg/dL (ref 204–475)

## 2014-06-06 LAB — PHOSPHORUS: Phosphorus: 3.3 mg/dL (ref 2.3–4.6)

## 2014-06-06 LAB — GLUCOSE, CAPILLARY: GLUCOSE-CAPILLARY: 140 mg/dL — AB (ref 70–99)

## 2014-06-06 LAB — LACTIC ACID, PLASMA: Lactic Acid, Venous: 1.7 mmol/L (ref 0.5–2.0)

## 2014-06-06 MED ORDER — EPINEPHRINE HCL 0.1 MG/ML IJ SOSY
PREFILLED_SYRINGE | INTRAMUSCULAR | Status: AC | PRN
  Administered 2014-06-06 (×2): 1 mg via INTRAVENOUS

## 2014-06-06 MED ORDER — FAMOTIDINE IN NACL 20-0.9 MG/50ML-% IV SOLN
20.0000 mg | Freq: Two times a day (BID) | INTRAVENOUS | Status: DC
  Administered 2014-06-07 – 2014-06-08 (×4): 20 mg via INTRAVENOUS
  Filled 2014-06-06 (×6): qty 50

## 2014-06-06 MED ORDER — SODIUM CHLORIDE 0.9 % IV SOLN: INTRAVENOUS | Status: DC

## 2014-06-06 MED ORDER — CALCIUM GLUCONATE 10 % IV SOLN
1.0000 g | Freq: Once | INTRAVENOUS | Status: AC
  Administered 2014-06-06: 1 g via INTRAVENOUS

## 2014-06-06 MED ORDER — ONDANSETRON HCL 4 MG/2ML IJ SOLN: 4.0000 mg | Freq: Four times a day (QID) | INTRAMUSCULAR | Status: DC | PRN

## 2014-06-06 MED ORDER — EPINEPHRINE HCL 0.1 MG/ML IJ SOSY
PREFILLED_SYRINGE | INTRAMUSCULAR | Status: AC
  Filled 2014-06-06: qty 40

## 2014-06-06 MED ORDER — ACETAMINOPHEN 325 MG PO TABS: 650.0000 mg | ORAL_TABLET | ORAL | Status: DC | PRN

## 2014-06-06 MED ORDER — MIDAZOLAM HCL 5 MG/5ML IJ SOLN
INTRAMUSCULAR | Status: AC
  Administered 2014-06-06: 5 mg
  Filled 2014-06-06: qty 5

## 2014-06-06 MED ORDER — SUCCINYLCHOLINE CHLORIDE 20 MG/ML IJ SOLN
INTRAMUSCULAR | Status: AC
  Administered 2014-06-06: 125 mg
  Filled 2014-06-06: qty 1

## 2014-06-06 MED ORDER — SODIUM CHLORIDE 0.9 % IV SOLN: 250.0000 mL | INTRAVENOUS | Status: DC | PRN

## 2014-06-06 MED ORDER — EPINEPHRINE HCL 1 MG/ML IJ SOLN
4.0000 ug/min | INTRAVENOUS | Status: DC
  Administered 2014-06-06: 4 ug/min via INTRAVENOUS

## 2014-06-06 MED ORDER — ROCURONIUM BROMIDE 50 MG/5ML IV SOLN
INTRAVENOUS | Status: AC
  Filled 2014-06-06: qty 2

## 2014-06-06 MED ORDER — ETOMIDATE 2 MG/ML IV SOLN
INTRAVENOUS | Status: AC
  Administered 2014-06-06: 20 mg
  Filled 2014-06-06: qty 20

## 2014-06-06 MED ORDER — LIDOCAINE HCL (CARDIAC) 20 MG/ML IV SOLN
INTRAVENOUS | Status: AC
  Filled 2014-06-06: qty 5

## 2014-06-06 MED ORDER — CALCIUM GLUCONATE 10 % IV SOLN
INTRAVENOUS | Status: AC
  Filled 2014-06-06: qty 10

## 2014-06-06 MED ORDER — HYDRALAZINE HCL 20 MG/ML IJ SOLN
5.0000 mg | INTRAMUSCULAR | Status: DC | PRN
  Administered 2014-06-07 – 2014-06-08 (×2): 5 mg via INTRAVENOUS
  Filled 2014-06-06 (×2): qty 1

## 2014-06-06 NOTE — ED Notes (Signed)
Patient being paced, 110 mA rate of 70

## 2014-06-06 NOTE — Procedures (Signed)
Name: Shelby MoralesSarah A Stainback MRN: [id number] DOB: 08/14/1945  DOS:  PROCEDURE NOTE  Procedure:  Arterial catheter placement.  Indications:  Need for invasive hemodynamic monitoring / frequent arterial blood gases measurement.  Consent:  Consent was implied due to the emergency nature of the procedure.  Procedure summary:  The patient was identified as [name] and safety timeout was performed. Collateral arterial flow was confirmed by performing Allen's. Sterile technique was used. The patient's left groin was prepped using chlorhexidine / alcohol scrub and the field was draped in usual sterile fashion with protective barrier. The left femoral artery was cannulated without difficulty. Blood was aspirated and the catheter was flushed with normal saline without difficulty. Good arterial waveform was obtained. The catheter was secured into place with sterile dressing.  Complications:  No immediate complications were noted.  Estimated blood loss:  Less then 5 mL  Wadie LessenMario Camilla Skeen, M.D. Pulmonary and Critical Care Medicine [facility] Cell: [phone] Pager: [phone]  06/13/2014, 10:40 PM

## 2014-06-06 NOTE — ED Notes (Signed)
carelink here 

## 2014-06-06 NOTE — Consult Note (Signed)
CARDIOLOGY CONSULT NOTE   Patient ID: [name] MRN: [id number], DOB/AGE: 05-Jul-1945   Admit date: 06/27/2014 Date of Consult: 06/12/2014   Primary Physician: Alice Reichert, MD Primary Cardiologist: None  Pt. Profile  69F with DM, HLD, HTN, GERD, NICM  (EF 20-25%), ESRD on HD (LUE fistula, T/R/S schedule) who presents with cardiac arrest with periods of CHB.   Problem List  Past Medical History  Diagnosis Date  . Type 2 diabetes mellitus   . Hypercholesteremia   . Essential hypertension, benign   . UTI (urinary tract infection)   . Ruptured appendix   . Iron deficiency anemia   . Noncompliance   . Renal insufficiency     Dialysis Tues, Thurs, & Sat  . Dialysis patient     Tuesday, Thursday and Saturday   . GERD (gastroesophageal reflux disease)   . 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 Appendectomy  2008    Exploratory laparotomy  . Left carpal tunnel sugrey    . Stitch granuloma excision  2009  . Esophagogastroduodenoscopy  01/19/2011    Procedure: ESOPHAGOGASTRODUODENOSCOPY (EGD);  Surgeon: Malissa Hippo, MD;  [facility];  Service: Endoscopy;  Laterality: N/A;  . Abdominal hysterectomy    . Tonsillectomy    . Cataract extraction w/phaco  07/10/2011    Procedure: CATARACT EXTRACTION PHACO AND INTRAOCULAR LENS PLACEMENT (IOC);  Surgeon: Gemma Payor, MD;  Location: [facility];  Service: Ophthalmology;  Laterality: Left;  CDE: 20.36  . Cataract extraction w/phaco  08/07/2011    Procedure: CATARACT EXTRACTION PHACO AND INTRAOCULAR LENS PLACEMENT (IOC);  Surgeon: Gemma Payor, MD;  Location: [facility];  Service: Ophthalmology;  Laterality: Right;  CDE:15.31     Allergies  No Known Allergies  HPI   69F with DM, HLD, HTN, GERD, NICM  (EF 20-25%), ESRD on HD (LUE fistula, T/R/S schedule) who presents with cardiac arrest with periods of CHB.   [name] was found to be choking by sister. Patient  was unresponsive at that time. EMS was called and arrived quickly. CPR was performed for 30 minutes followed by ROSC. Had second arrest en route to the [facility] ER which lasted about 15 minutes. She required external pacing. King airway placed in field. She had a 3rd code in the [facility] ER. When [facility] airway was switched for ETT in the ER, a large piece of meat was recovered. Labs on arrival were notable for K 3.7, Cr 3.02, AST 194, ALT 190, TnI 0.09, WBC 16.8, hct 31.2. ECG demonstrated CHB with RBBB like escape. She continued to require intermittent transcutaneous pacing.  She was transferred to [facility] for further post arrest care.   She arrived intubated on 4 of epi, receiving intermittent transcutaneous pacing. Posturing noted. Transcutaneous pads were switched for CCU defibrillator and pacing switched to VVI 60 ( , capture confirmed at this output) and underlying rhythm observed at 84bpm. ECG demonstrated NSR, 1st degree HB, and LBBB.   Inpatient Medications  . EPINEPHrine      . lidocaine (cardiac) 100 mg/48ml      . rocuronium        Family History Family History  Problem Relation Age of Onset  . Hypertension    . Anesthesia problems Neg Hx   . Hypotension Neg Hx   . Malignant hyperthermia Neg Hx   . Pseudochol deficiency Neg Hx      Social  History History   Social History  . Marital Status: Married    Spouse Name: N/A    Number of Children: N/A  . Years of Education: N/A   Occupational History  . Not on file.   Social History Main Topics  . Smoking status: Never Smoker   . Smokeless tobacco: Never Used  . Alcohol Use: No  . Drug Use: No  . Sexual Activity: Not on file   Other Topics Concern  . Not on file   Social History Narrative     Review of Systems  Unable to obtain  Physical Exam  Blood pressure 210/106, pulse 80, temperature 93.8 F (34.3 C), temperature source Core (Comment), resp. rate 20, SpO2 100 %.  General: Intubated, posturing Psych:  Unresponsive Neuro: Unresponsive. Posturing. HEENT: ETT  Neck: Supple without bruits or JVD. Lungs:  Coarse anterior BS Heart: RRR no s3, s4, or murmurs. Abdomen: Soft, distended with obvious umbilical hernia which protrudes with every transcutaneous pacing waveform.  Extremities: No clubbing, cyanosis or edema. DP/PT and equal bilaterally. LUE fistula with thrill.   Labs   Recent Labs  06/08/2014 1907  TROPONINI 0.09*   Lab Results  Component Value Date   WBC 16.8* 06/02/2014   HGB 10.2* 06/03/2014   HCT 31.2* 06/04/2014   MCV 98.7 06/22/2014   PLT 301 06/18/2014    Recent Labs Lab 06/27/2014 1907  NA 140  K 3.7  CL 100  CO2 28  BUN 23  CREATININE 3.02*  CALCIUM 7.9*  PROT 6.6  BILITOT 0.9  ALKPHOS 80  ALT 190*  AST 194*  GLUCOSE 205*   No results found for: CHOL, HDL, LDLCALC, TRIG No results found for: DDIMER  Radiology/Studies  Dg Chest Portable 1 View  06/23/2014   CLINICAL DATA:  Cardiac arrest.  EXAM: PORTABLE CHEST - 1 VIEW  COMPARISON:  04/11/2014.  FINDINGS: Nasogastric tube is followed into the stomach. Trachea is midline. Heart is enlarged. Thoracic aorta is calcified. Mild diffuse interstitial prominence and indistinctness. No definite pleural fluid. No pneumothorax.  IMPRESSION: Mild edema.   Electronically Signed   By: Leanna BattlesMelinda  Blietz M.D.   On: 06/02/2014 19:51   Dg Chest Port 1v Same Day  06/13/2014   CLINICAL DATA:  Cardiac arrest, intubation.  EXAM: PORTABLE CHEST - 1 VIEW SAME DAY  COMPARISON:  06/05/2014.  FINDINGS: Endotracheal tube terminates approximately 1.5 cm above carina. Nasogastric tube terminates in the stomach. Heart is enlarged. Mild diffuse interstitial prominence and indistinctness, as before. Calcified granuloma in the lingula. No definite pleural fluid. Defibrillator pads project over the lower left hemi thorax.  IMPRESSION: 1. Endotracheal tube is low lying. Retracting approximately 2-3 cm would better position the tip above the  carina. These results were called by telephone at the time of interpretation on 06/27/2014 at 8:12 pm to Dr. Mancel BaleELLIOTT WENTZ , who verbally acknowledged these results. 2. Mild edema.   Electronically Signed   By: Leanna BattlesMelinda  Blietz M.D.   On: 06/24/2014 20:12   01/07/11 TTE  Study Conclusions  - Left ventricle: The cavity size was normal. Wall thickness was increased in a pattern of mild LVH. There was mild focal basal hypertrophy of the septum. Systolic function was severely reduced. The estimated ejection fraction was in the range of 20% to 25%. Diffuse global hypokinesis. There is akinesis of the apical myocardium. There is severe hypokinesis of the anteroseptal myocardium. Features are consistent with a pseudonormal left ventricular filling pattern, with concomitant abnormal relaxation and increased  filling pressure (grade 2 diastolic dysfunction). - Aortic valve: Mildly calcified annulus. Trileaflet; moderately calcified leaflets. Cusp separation was reduced. There was mild stenosis. No significant regurgitation. Mean gradient: 8mm Hg (S). - Mitral valve: Calcified annulus. Mild regurgitation. - Left atrium: The atrium was moderately dilated. - Right atrium: The atrium was mildly dilated. - Tricuspid valve: Moderate regurgitation. - Pulmonic valve: Mild regurgitation. - Pulmonary arteries: Systolic pressure was moderately increased. PA peak pressure: 56mm Hg (S). - Inferior vena cava: The vessel was dilated; the respirophasic diameter changes were blunted (< 50%); findings are consistent with elevated central venous pressure. - Pericardium, extracardiac: There was no pericardial effusion. There was a left pleural effusion.  ECG  06/12/2014: CHB, atypical RBBB like QRS complex ventricular rate of 61bpm, LAE 04/11/14: NSR, LAE, LVH with strain  ASSESSMENT AND PLAN  72F with DM, HLD, HTN, GERD, NICM (EF 20-25%), ESRD on HD (LUE fistula, T/R/S  schedule) who presents with cardiac arrest with periods of CHB. At this point, her rhythm appears to have stabilized somewhat and she is not requiring pacing. Given poor prognosis based on down time, posturing, shock liver, and multiple pre-existing co-morbidities, in the setting of relative rhythm stability, would not recommend placement of a transvenous pacer. The defibrillator patches are capturing well at if she does develop bradycardia.    Signed, Glori Luis, MD 06/07/2014, 9:22 PM

## 2014-06-06 NOTE — ED Notes (Signed)
Patient arrives via EMS from home with c/o cardiac arrest. Arrives being paced by EMS 130 mamp with rate of 70. Patient given 3 epi, 1 amp bicarb, 2 mg Narcan PTA. CBG 218, h/o DM and Dialysis ( last done today). Patient with Brooke DareKing airway in place on arrival.

## 2014-06-06 NOTE — ED Provider Notes (Signed)
CSN: [id number]     Arrival date & time 06/20/2014  1836 History   First MD Initiated Contact with Patient 06/26/2014 1841     Chief Complaint  Patient presents with  . Cardiac Arrest     (Consider location/radiation/quality/duration/timing/severity/associated sxs/prior Treatment) The history is provided by the EMS personnel.     [name] is a 69 y.o. female who presents for evaluation of cardiac arrest.  Patient was at home when found by her sister to be choking.  Sister did Heimlich maneuver, while patient was seated.  The patient was unresponsive at that time.  EMS was called and arrived quickly.  EMS, and ACLS treatment.  They resuscitated her for 30 minutes, got pulses then lost them, and performed another 15 minutes of CPR during transport.  Upon arrival, she had been receiving external pacing with reported blood pressure and pulse for about 20 minutes.  On arrival, she did not have a strong palpable pulse, so she was again resuscitated with IV epinephrine.  Cardiopulmonary resuscitation was continued.  Patient had a King airway on arrival.  Patient apparently dialyzed earlier today, and was eating post dialysis when she arrested.  Level V Caveat- unresponsive      Past Medical History  Diagnosis Date  . Type 2 diabetes mellitus   . Hypercholesteremia   . Essential hypertension, benign   . UTI (urinary tract infection)   . Ruptured appendix   . Iron deficiency anemia   . Noncompliance   . Renal insufficiency     Dialysis Tues, Thurs, & Sat  . Dialysis patient     Tuesday, Thursday and Saturday   . GERD (gastroesophageal reflux disease)   . 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 Appendectomy  2008    Exploratory laparotomy  . Left carpal tunnel sugrey    . Stitch granuloma excision  2009  . Esophagogastroduodenoscopy  01/19/2011    Procedure: ESOPHAGOGASTRODUODENOSCOPY (EGD);  Surgeon: Malissa Hippo, MD;   [facility];  Service: Endoscopy;  Laterality: N/A;  . Abdominal hysterectomy    . Tonsillectomy    . Cataract extraction w/phaco  07/10/2011    Procedure: CATARACT EXTRACTION PHACO AND INTRAOCULAR LENS PLACEMENT (IOC);  Surgeon: Gemma Payor, MD;  Location: [facility];  Service: Ophthalmology;  Laterality: Left;  CDE: 20.36  . Cataract extraction w/phaco  08/07/2011    Procedure: CATARACT EXTRACTION PHACO AND INTRAOCULAR LENS PLACEMENT (IOC);  Surgeon: Gemma Payor, MD;  Location: [facility];  Service: Ophthalmology;  Laterality: Right;  CDE:15.31   Family History  Problem Relation Age of Onset  . Hypertension    . Anesthesia problems Neg Hx   . Hypotension Neg Hx   . Malignant hyperthermia Neg Hx   . Pseudochol deficiency Neg Hx    History  Substance Use Topics  . Smoking status: Never Smoker   . Smokeless tobacco: Never Used  . Alcohol Use: No   OB History    No data available     Review of Systems  Unable to perform ROS     Allergies  Review of patient's allergies indicates no known allergies.  Home Medications   Prior to Admission medications   Medication Sig Start Date End Date Taking? Authorizing Provider  amLODipine (NORVASC) 10 MG tablet Take 10 mg by mouth daily.    Historical Provider, MD  b complex-vitamin c-folic acid (NEPHRO-VITE) 0.8 MG TABS  tablet Take 1 tablet by mouth daily.    Historical Provider, MD  carvedilol (COREG) 12.5 MG tablet Take 12.5 mg by mouth 2 (two) times daily.    Historical Provider, MD  cloNIDine (CATAPRES) 0.1 MG tablet Take 0.1 mg by mouth 2 (two) times daily. Take before dialysis    Historical Provider, MD  hydrALAZINE (APRESOLINE) 50 MG tablet Take 50 mg by mouth 4 (four) times daily. 01/24/11 04/15/13  Alice Reichert, MD  HYDROcodone-acetaminophen (NORCO/VICODIN) 5-325 MG per tablet Take 1 tablet by mouth every 6 (six) hours as needed (pain).    Historical Provider, MD  isosorbide mononitrate (IMDUR) 60 MG 24 hr tablet Take 60 mg by  mouth daily.    Historical Provider, MD  meclizine (ANTIVERT) 12.5 MG tablet Take 12.5 mg by mouth every 4 (four) hours as needed for dizziness.    Historical Provider, MD  Nutritional Supplements (NEPRO) LIQD Take 1 Can by mouth 2 (two) times daily.    Historical Provider, MD  pantoprazole (PROTONIX) 40 MG tablet Take 40 mg by mouth daily.    Historical Provider, MD  sevelamer carbonate (RENVELA) 800 MG tablet Take 800-2,400 mg by mouth 3 (three) times daily with meals. Patient takes 3 tabs with meals and 1 tab with snack    Historical Provider, MD   BP 180/100 mmHg  Pulse 140  Resp 33  SpO2 81% Physical Exam  Constitutional: She appears well-developed and well-nourished.  HENT:  Head: Normocephalic and atraumatic.  Right Ear: External ear normal.  Left Ear: External ear normal.  Eyes: Conjunctivae and EOM are normal.  Left pupil 4 mm right pupil 3 mm, neither pupil is reactive  Neck: Phonation normal. No tracheal deviation present.  Cardiovascular:  Pulseless  Pulmonary/Chest: Breath sounds normal. She exhibits no bony tenderness.  Normal air movement bilaterally, equal with bagging  Abdominal: Soft. She exhibits no mass.  Large midline abdominal wall hernia  Musculoskeletal: She exhibits no edema.  Neurological: No sensory deficit.  GCS 3  Skin: Skin is warm, dry and intact.  Nursing note and vitals reviewed.   ED Course  Procedures (including critical care time)  ACLS resuscitation- epinephrine, multiple boluses, cardiopulmonary resuscitation, IV fluids, external pacing.  King airway changed out for ET tube.  During this procedure a large chunk of meat, 2" x 1" x 0.5", was removed from the oropharynx.  There is a small amount of bleeding in the oropharynx, and some other food material, which was suctioned from the oropharynx.  There is no visible food products at the glottis or between the cords.  ET tube was easily placed, using Glide scope viualization.   Blood obtained  from right femoral vein, by me. No pulse ever obtained in groin, while pulses present in arms.  Cardiopulmonary Resuscitation (CPR) Procedure Note Directed/Performed by: Flint Melter I personally directed ancillary staff and/or performed CPR in an effort to regain return of spontaneous circulation and to maintain cardiac, neuro and systemic perfusion.   INTUBATION Performed by: Flint Melter  Required items: required blood products, implants, devices, and special equipment available Patient identity confirmed: provided demographic data and hospital-assigned identification number Time out: Immediately prior to procedure a "time out" was called to verify the correct patient, procedure, equipment, support staff and site/side marked as required.  Indications:cardiac arrest Intubation method: Glidescope Laryngoscopy   Preoxygenation: 100% O2 Sedatives: 20 mg ate Paralytic: 125 mg   Succinyl choline 7.0 cuffed ET tube placed without complication  Post-procedure assessment: chest rise and  ETCO2 monitor Breath sounds: equal and absent over the epigastrium Tube secured with: ETT holder Chest x-ray interpreted by radiologist and me.  Chest x-ray findings: endotracheal tube 1.5 cm above carina; pulled back 2 cm  Patient tolerated the procedure well with no immediate complications.  Consultation with cardiology, Dr. Excell Seltzerooper for consideration of urgent catheterization.  EKG was reviewed with me.  It does not indicate STEMI.  He would like the patient admitted by an intensivist, to the ICU, and will place an internal pacer if needed.  Consultation with intensivist, Dr. Pasty ArchJ Smith, he accepts the patient at [facility] to an ICU bed   CRITICAL CARE Performed by: Mancel BaleWENTZ,Kloi Brodman L Total critical care time: 95 minutes Critical care time was exclusive of separately billable procedures and treating other patients. Critical care was necessary to treat or prevent imminent or life-threatening  deterioration. Critical care was time spent personally by me on the following activities: development of treatment plan with patient and/or surrogate as well as nursing, discussions with consultants, evaluation of patient's response to treatment, examination of patient, obtaining history from patient or surrogate, ordering and performing treatments and interventions, ordering and review of laboratory studies, ordering and review of radiographic studies, pulse oximetry and re-evaluation of patient's condition.   Labs Review Labs Reviewed  CBC WITH DIFFERENTIAL/PLATELET - Abnormal; Notable for the following:    WBC 16.8 (*)    RBC 3.16 (*)    Hemoglobin 10.2 (*)    HCT 31.2 (*)    Neutro Abs 11.3 (*)    Monocytes Absolute 1.5 (*)    All other components within normal limits  COMPREHENSIVE METABOLIC PANEL - Abnormal; Notable for the following:    Glucose, Bld 205 (*)    Creatinine, Ser 3.02 (*)    Calcium 7.9 (*)    Albumin 3.2 (*)    AST 194 (*)    ALT 190 (*)    GFR calc non Af Amer 15 (*)    GFR calc Af Amer 17 (*)    All other components within normal limits  TROPONIN I - Abnormal; Notable for the following:    Troponin I 0.09 (*)    All other components within normal limits  BLOOD GAS, ARTERIAL - Abnormal; Notable for the following:    pO2, Arterial 505.0 (*)    All other components within normal limits    Imaging Review Dg Chest Portable 1 View  06/17/2014   CLINICAL DATA:  Cardiac arrest.  EXAM: PORTABLE CHEST - 1 VIEW  COMPARISON:  04/11/2014.  FINDINGS: Nasogastric tube is followed into the stomach. Trachea is midline. Heart is enlarged. Thoracic aorta is calcified. Mild diffuse interstitial prominence and indistinctness. No definite pleural fluid. No pneumothorax.  IMPRESSION: Mild edema.   Electronically Signed   By: Leanna BattlesMelinda  Blietz M.D.   On: 2015-02-13 19:51     EKG Interpretation   Date/Time:  Saturday June 06 2014 18:51:02 EST Ventricular Rate:  61 PR  Interval:  111 QRS Duration: 134 QT Interval:  550 QTC Calculation: 554 R Axis:   -69 Text Interpretation:  Sinus rhythm Borderline short PR interval Biatrial  enlargement Right bundle branch block Left ventricular hypertrophy  Non-specific ST-t changes are changed from prior Confirmed by [facility]oscopy[facility]  MD,  Kairah Leoni ([id number]) on 06/01/2014 8:09:13 PM      MDM   Final diagnoses:  Choking episode  End stage renal disease  Cardiac Arrest  Nursing Notes Reviewed/ Care Coordinated, and agree without changes. Applicable Imaging Reviewed.  Interpretation of Laboratory Data incorporated into ED treatmen   Plan: transfer to [place]      Flint Melter, MD 06/07/14 1203

## 2014-06-06 NOTE — ED Notes (Signed)
Patient's family at bedside

## 2014-06-06 NOTE — Procedures (Signed)
Central Venous Catheter Insertion Procedure Note Lysle MoralesSarah A Guardino [id number] 06-29-1945  Procedure: Insertion of Central Venous Catheter Indications: Drug and/or fluid administration and Frequent blood sampling  Procedure Details Consent: Unable to obtain consent because of emergent medical necessity. Time Out: Verified patient identification, verified procedure, site/side was marked, verified correct patient position, special equipment/implants available, medications/allergies/relevent history reviewed, required imaging and test results available.  Performed  Maximum sterile technique was used including antiseptics, cap, gloves, gown, hand hygiene, mask and sheet. Skin prep: Chlorhexidine; local anesthetic administered A antimicrobial bonded/coated triple lumen catheter was placed in the left femoral vein due to patient being a dialysis patient and emergent situation using the Seldinger technique.  Evaluation Blood flow good Complications: No apparent complications Patient did tolerate procedure well.  Latrena Benegas 06/27/2014, 10:41 PM

## 2014-06-06 NOTE — ED Notes (Signed)
No Palpable pulses 6:39 PM CPR initiated.

## 2014-06-06 NOTE — ED Notes (Signed)
Patients belongings removed and placed in belongings bag

## 2014-06-06 NOTE — ED Notes (Signed)
Patients KING airway replaced at this time with 7.0 ETT, 23 at the teeth. 20 of etomidate and 125 succinylcholine on board

## 2014-06-06 NOTE — H&P (Signed)
PULMONARY / CRITICAL CARE MEDICINE   Name: Shelby MoralesSarah A Usman MRN: [id number] DOB: 01/11/1946    ADMISSION DATE:  06/01/2014 CONSULTATION DATE:  06/01/2014  [facility] :  [facility]  CHIEF COMPLAINT:  S/p cardiac arrest  INITIAL PRESENTATION: 30F ESRD on HD, choked on her food at home after dialysis, resulting in cardiac arrest with prolonged resuscitation (more than 40 minutes) with ROSC. Possibly decerebrating. Off pacer, off pressor. A piece of ham was removed from her throat during intubation.  STUDIES:  CT head 06/13/2014: pending  SIGNIFICANT EVENTS: Intubated 06/07/2014   HISTORY OF PRESENT ILLNESS:  Patient unable to give history due to altered mental status, history taken from chart review and talking to staff members. Basically this is 69 y.o. female who presents for evaluation of cardiac arrest. She was at home when found by her sister to be choking. Sister did Heimlich maneuver to no avail. The patient went unresponsive. EMS was called and arrived quickly. EMS, and ACLS treatment. They resuscitated her for 30 minutes, got pulses then lost them, and performed another 15 minutes of CPR during transport. There was a report of bradycardia, unclear how slow. Upon arrival, she had been receiving external pacing with reported blood pressure and pulse for about 20 minutes. On arrival, she did not have a strong palpable pulse, so she was again resuscitated with IV epinephrine. Cardiopulmonary resuscitation was continued. Patient had a King airway on arrival. Patient apparently dialyzed earlier today, and was eating post dialysis when she arrested after choking on he food, a large piece of ham was removed during intubation. Was bradycardiac, got paced and epi drip started. Possible decerebrating. Now off pressor and off pacer.  PAST MEDICAL HISTORY :   has a past medical history of Type 2 diabetes mellitus; Hypercholesteremia; Essential hypertension, benign; UTI (urinary tract infection);  Ruptured appendix; Iron deficiency anemia; Noncompliance; Renal insufficiency; Dialysis patient; GERD (gastroesophageal reflux disease); and Fistula.  has past surgical history that includes Appendectomy (2008); Left carpal tunnel sugrey; Stitch granuloma excision (2009); Esophagogastroduodenoscopy (01/19/2011); Abdominal hysterectomy; Tonsillectomy; Cataract extraction w/PHACO (07/10/2011); and Cataract extraction w/PHACO (08/07/2011). Prior to Admission medications   Medication Sig Start Date End Date Taking? Authorizing Provider  amLODipine (NORVASC) 10 MG tablet Take 10 mg by mouth daily.    Historical Provider, MD  b complex-vitamin c-folic acid (NEPHRO-VITE) 0.8 MG TABS tablet Take 1 tablet by mouth daily.    Historical Provider, MD  carvedilol (COREG) 12.5 MG tablet Take 12.5 mg by mouth 2 (two) times daily.    Historical Provider, MD  cloNIDine (CATAPRES) 0.1 MG tablet Take 0.1 mg by mouth 2 (two) times daily. Take before dialysis    Historical Provider, MD  hydrALAZINE (APRESOLINE) 50 MG tablet Take 50 mg by mouth 4 (four) times daily. 01/24/11 04/15/13  Alice ReichertAngus G McInnis, MD  HYDROcodone-acetaminophen (NORCO/VICODIN) 5-325 MG per tablet Take 1 tablet by mouth every 6 (six) hours as needed (pain).    Historical Provider, MD  isosorbide mononitrate (IMDUR) 60 MG 24 hr tablet Take 60 mg by mouth daily.    Historical Provider, MD  meclizine (ANTIVERT) 12.5 MG tablet Take 12.5 mg by mouth every 4 (four) hours as needed for dizziness.    Historical Provider, MD  Nutritional Supplements (NEPRO) LIQD Take 1 Can by mouth 2 (two) times daily.    Historical Provider, MD  pantoprazole (PROTONIX) 40 MG tablet Take 40 mg by mouth daily.    Historical Provider, MD  sevelamer carbonate (RENVELA) 800 MG  tablet Take 800-2,400 mg by mouth 3 (three) times daily with meals. Patient takes 3 tabs with meals and 1 tab with snack    Historical Provider, MD   No Known Allergies  FAMILY HISTORY:  has no family status  information on file.  SOCIAL HISTORY:  reports that she has never smoked. She has never used smokeless tobacco. She reports that she does not drink alcohol or use illicit drugs.  REVIEW OF SYSTEMS:  Unable to obtain due to altered mental status  SUBJECTIVE:   VITAL SIGNS: Temp:  [93 F (33.9 C)-93.8 F (34.3 C)] 93.8 F (34.3 C) (02/06 2016) Pulse Rate:  [60-140] 80 (02/06 2016) Resp:  [14-33] 20 (02/06 2016) BP: (180-280)/(100-112) 210/106 mmHg (02/06 2016) SpO2:  [81 %-100 %] 100 % (02/06 2016) FiO2 (%):  [100 %] 100 % (02/06 2143) HEMODYNAMICS:   VENTILATOR SETTINGS: Vent Mode:  [-] PRVC FiO2 (%):  [100 %] 100 % Set Rate:  [14 bmp-20 bmp] 14 bmp Vt Set:  [450 mL-530 mL] 450 mL PEEP:  [5 cmH20] 5 cmH20 Plateau Pressure:  [25 cmH20] 25 cmH20 INTAKE / OUTPUT: No intake or output data in the 24 hours ending 06/28/2014 2145  PHYSICAL EXAMINATION: General:  Unresponsive, possible decerebrate posturing Neuro:  decrebrate posturing on pain stimuli, pupils equal and sluggish HEENT:  Atraumatic, ET tube in place, no oral wounds Cardiovascular:  RRR, no loud murmur Lungs:  Scattered rhonchi, no wheeze Abdomen:  Soft, no guarding, large ventral abdominal hernia Musculoskeletal:  No gross deformities, no edema, fistula present at left arm Skin:  No ecchymosis  LABS:  CBC  Recent Labs Lab 06/05/2014 1907  WBC 16.8*  HGB 10.2*  HCT 31.2*  PLT 301   Coag's No results for input(s): APTT, INR in the last 168 hours. BMET  Recent Labs Lab 06/13/2014 1907  NA 140  K 3.7  CL 100  CO2 28  BUN 23  CREATININE 3.02*  GLUCOSE 205*   Electrolytes  Recent Labs Lab 06/03/2014 1907  CALCIUM 7.9*   Sepsis Markers No results for input(s): LATICACIDVEN, PROCALCITON, O2SATVEN in the last 168 hours. ABG  Recent Labs Lab 06/24/2014 1920  PHART 7.394  PCO2ART 38.5  PO2ART 505.0*   Liver Enzymes  Recent Labs Lab 06/28/2014 1907  AST 194*  ALT 190*  ALKPHOS 80  BILITOT 0.9   ALBUMIN 3.2*   Cardiac Enzymes  Recent Labs Lab 06/26/2014 1907  TROPONINI 0.09*   Glucose No results for input(s): GLUCAP in the last 168 hours.  Imaging No results found.   ASSESSMENT / PLAN:  PULMONARY OETT 06/28/2014 A: Hypoxic resp failure from choking resulting in prolonged cardiac arrest, intubated now P:   Vent support and wean as tolerated VAP bundle. No evidence of pneumonia for now. F/u ABG  CARDIOVASCULAR CVL left femoral 06/04/2014>>> A: s/p cardiac arrest, nonshockable, likely related to hypoxia Transient hypotension and bradycardia now off pacer and off epi drip History of HTN P:  Cardio is on board. Check electrolytes Transcutaneous pacer bedside if needed, do not give and AV block medication for now Hydralazine PRN (for now will treat if BP>200/120 for concern of possible stroke from her prolonged CPR, if she has a head bleed than the goal will be lower around 140/90) Will do CT head first and if there's no bleed then will start normothermia protocol  RENAL A:  ESRD on HD P:   Check electrolytes Get renal consult in AM or on Monday for HD every TThS  GASTROINTESTINAL A:  No acute issue P:   NPO for now until patient more stable  HEMATOLOGIC A:  Chronic anemia likely related to ESRD Question of DIC (was oozing some blood during procedure) P:  Will check CBC, fibrinogen, haptoglobin, INR, pTT Type and cross  INFECTIOUS A:  No acute issue currently P:   BCx2  UC  Sputum Abx:   ENDOCRINE A:  Known DM   P:   Fingerstick Q4, goal 140-180  NEUROLOGIC A:  Encephalopathy likely from prolonged cardiac arrest, question of decerebrate posturing P:   No sedating meds for now CT head stat. If there's no acute changes on CT head and patient not improving in the next 2-3 days then consider MRI RASS goal: 0 to -1 Needs family discussion about goals of care and code status when they get here   FAMILY  - Updates:   - Inter-disciplinary family meet  or Palliative Care meeting due by:      TODAY'S SUMMARY: Choked on food, resulting in cardiorespiratory arrest, prolonged code with transient bradycardia and hypotension, off pacer and off epi drip. On vent. Needs STAT CT head and if negative may consider normothermia protocol.    Critical Care time: 90 minutes  Wadie Lessen Pulmonary [facility] Critical Care Medicine [facility] Pager: [phone]  06-26-2014, 9:45 PM

## 2014-06-06 NOTE — ED Notes (Signed)
ett to 21 at the lip

## 2014-06-07 ENCOUNTER — Inpatient Hospital Stay (HOSPITAL_COMMUNITY): Payer: Medicare Other

## 2014-06-07 DIAGNOSIS — I469 Cardiac arrest, cause unspecified: Secondary | ICD-10-CM

## 2014-06-07 DIAGNOSIS — J9690 Respiratory failure, unspecified, unspecified whether with hypoxia or hypercapnia: Secondary | ICD-10-CM | POA: Insufficient documentation

## 2014-06-07 DIAGNOSIS — R0989 Other specified symptoms and signs involving the circulatory and respiratory systems: Secondary | ICD-10-CM | POA: Insufficient documentation

## 2014-06-07 DIAGNOSIS — N186 End stage renal disease: Secondary | ICD-10-CM | POA: Insufficient documentation

## 2014-06-07 DIAGNOSIS — J9601 Acute respiratory failure with hypoxia: Principal | ICD-10-CM

## 2014-06-07 LAB — GLUCOSE, CAPILLARY
GLUCOSE-CAPILLARY: 115 mg/dL — AB (ref 70–99)
GLUCOSE-CAPILLARY: 78 mg/dL (ref 70–99)
GLUCOSE-CAPILLARY: 81 mg/dL (ref 70–99)
Glucose-Capillary: 144 mg/dL — ABNORMAL HIGH (ref 70–99)
Glucose-Capillary: 130 mg/dL — ABNORMAL HIGH (ref 70–99)
Glucose-Capillary: 120 mg/dL — ABNORMAL HIGH (ref 70–99)
Glucose-Capillary: 91 mg/dL (ref 70–99)

## 2014-06-07 LAB — CBC
HCT: 26.2 % — ABNORMAL LOW (ref 36.0–46.0)
HEMOGLOBIN: 9 g/dL — AB (ref 12.0–15.0)
MCH: 32 pg (ref 26.0–34.0)
MCHC: 34.4 g/dL (ref 30.0–36.0)
MCV: 93.2 fL (ref 78.0–100.0)
Platelets: 240 10*3/uL (ref 150–400)
RBC: 2.81 MIL/uL — AB (ref 3.87–5.11)
RDW: 13.4 % (ref 11.5–15.5)
WBC: 15.8 10*3/uL — ABNORMAL HIGH (ref 4.0–10.5)

## 2014-06-07 LAB — URINE MICROSCOPIC-ADD ON

## 2014-06-07 LAB — POCT I-STAT 3, ART BLOOD GAS (G3+)
Acid-Base Excess: 6 mmol/L — ABNORMAL HIGH (ref 0.0–2.0)
Bicarbonate: 29.8 mEq/L — ABNORMAL HIGH (ref 20.0–24.0)
O2 SAT: 96 %
Patient temperature: 35.8
TCO2: 31 mmol/L (ref 0–100)
pCO2 arterial: 38.2 mmHg (ref 35.0–45.0)
pH, Arterial: 7.496 — ABNORMAL HIGH (ref 7.350–7.450)
pO2, Arterial: 72 mmHg — ABNORMAL LOW (ref 80.0–100.0)

## 2014-06-07 LAB — BASIC METABOLIC PANEL
ANION GAP: 9 (ref 5–15)
ANION GAP: 11 (ref 5–15)
BUN: 32 mg/dL — AB (ref 6–23)
BUN: 32 mg/dL — ABNORMAL HIGH (ref 6–23)
CO2: 30 mmol/L (ref 19–32)
CO2: 27 mmol/L (ref 19–32)
Calcium: 8.1 mg/dL — ABNORMAL LOW (ref 8.4–10.5)
Calcium: 7.9 mg/dL — ABNORMAL LOW (ref 8.4–10.5)
Chloride: 101 mmol/L (ref 96–112)
Chloride: 101 mmol/L (ref 96–112)
Creatinine, Ser: 3.13 mg/dL — ABNORMAL HIGH (ref 0.50–1.10)
Creatinine, Ser: 3.29 mg/dL — ABNORMAL HIGH (ref 0.50–1.10)
GFR calc Af Amer: 16 mL/min — ABNORMAL LOW (ref >90)
GFR calc non Af Amer: 13 mL/min — ABNORMAL LOW (ref >90)
GFR, EST AFRICAN AMERICAN: 15 mL/min — AB (ref >90)
GFR, EST NON AFRICAN AMERICAN: 14 mL/min — AB (ref >90)
Glucose, Bld: 134 mg/dL — ABNORMAL HIGH (ref 70–99)
Glucose, Bld: 121 mg/dL — ABNORMAL HIGH (ref 70–99)
Potassium: 3.4 mmol/L — ABNORMAL LOW (ref 3.5–5.1)
Potassium: 3.8 mmol/L (ref 3.5–5.1)
Sodium: 140 mmol/L (ref 135–145)
Sodium: 139 mmol/L (ref 135–145)

## 2014-06-07 LAB — TROPONIN I
TROPONIN I: 0.76 ng/mL — AB (ref <0.031)
Troponin I: 0.92 ng/mL (ref <0.031)

## 2014-06-07 LAB — PROTIME-INR
INR: 1.2 (ref 0.00–1.49)
Prothrombin Time: 15.3 seconds — ABNORMAL HIGH (ref 11.6–15.2)

## 2014-06-07 LAB — URINALYSIS, ROUTINE W REFLEX MICROSCOPIC
Bilirubin Urine: NEGATIVE
GLUCOSE, UA: 100 mg/dL — AB
Ketones, ur: 15 mg/dL — AB
Nitrite: NEGATIVE
PH: 8 (ref 5.0–8.0)
Specific Gravity, Urine: 1.013 (ref 1.005–1.030)
UROBILINOGEN UA: 0.2 mg/dL (ref 0.0–1.0)

## 2014-06-07 LAB — PHOSPHORUS: Phosphorus: 1.9 mg/dL — ABNORMAL LOW (ref 2.3–4.6)

## 2014-06-07 LAB — TRIGLYCERIDES: TRIGLYCERIDES: 29 mg/dL (ref <150)

## 2014-06-07 LAB — MAGNESIUM: Magnesium: 1.8 mg/dL (ref 1.5–2.5)

## 2014-06-07 LAB — ABO/RH: ABO/RH(D): O POS

## 2014-06-07 LAB — HAPTOGLOBIN: Haptoglobin: 101 mg/dL (ref 34–200)

## 2014-06-07 LAB — MRSA PCR SCREENING: MRSA BY PCR: NEGATIVE

## 2014-06-07 MED ORDER — POTASSIUM PHOSPHATES 15 MMOLE/5ML IV SOLN
10.0000 mmol | Freq: Once | INTRAVENOUS | Status: AC
  Administered 2014-06-07: 10 mmol via INTRAVENOUS
  Filled 2014-06-07: qty 3.33

## 2014-06-07 MED ORDER — FENTANYL CITRATE 0.05 MG/ML IJ SOLN: 50.0000 ug | Freq: Once | INTRAMUSCULAR | Status: DC

## 2014-06-07 MED ORDER — CHLORHEXIDINE GLUCONATE 0.12 % MT SOLN
15.0000 mL | Freq: Two times a day (BID) | OROMUCOSAL | Status: DC
  Administered 2014-06-07 – 2014-06-10 (×9): 15 mL via OROMUCOSAL
  Filled 2014-06-07 (×9): qty 15

## 2014-06-07 MED ORDER — CETYLPYRIDINIUM CHLORIDE 0.05 % MT LIQD
7.0000 mL | Freq: Four times a day (QID) | OROMUCOSAL | Status: DC
  Administered 2014-06-07 – 2014-06-11 (×17): 7 mL via OROMUCOSAL

## 2014-06-07 MED ORDER — NOREPINEPHRINE BITARTRATE 1 MG/ML IV SOLN
0.0000 ug/min | INTRAVENOUS | Status: DC
  Administered 2014-06-07: 5 ug/min via INTRAVENOUS
  Filled 2014-06-07: qty 4

## 2014-06-07 MED ORDER — MIDAZOLAM HCL 2 MG/2ML IJ SOLN
1.0000 mg | INTRAMUSCULAR | Status: DC | PRN
  Administered 2014-06-07 (×2): 1 mg via INTRAVENOUS
  Filled 2014-06-07: qty 2

## 2014-06-07 MED ORDER — SODIUM CHLORIDE 0.9 % IJ SOLN
10.0000 mL | INTRAMUSCULAR | Status: DC | PRN
  Administered 2014-06-10 (×2): 10 mL
  Filled 2014-06-07 (×2): qty 40

## 2014-06-07 MED ORDER — SODIUM CHLORIDE 0.9 % IV SOLN: 2000.0000 mL | Freq: Once | INTRAVENOUS | Status: DC

## 2014-06-07 MED ORDER — ASPIRIN 300 MG RE SUPP
300.0000 mg | RECTAL | Status: AC
  Administered 2014-06-07: 300 mg via RECTAL
  Filled 2014-06-07: qty 1

## 2014-06-07 MED ORDER — MIDAZOLAM HCL 2 MG/2ML IJ SOLN
1.0000 mg | INTRAMUSCULAR | Status: DC | PRN
  Filled 2014-06-07: qty 2

## 2014-06-07 MED ORDER — FENTANYL BOLUS VIA INFUSION
25.0000 ug | INTRAVENOUS | Status: DC | PRN
  Filled 2014-06-07: qty 25

## 2014-06-07 MED ORDER — PROPOFOL 10 MG/ML IV EMUL
0.0000 ug/kg/min | INTRAVENOUS | Status: DC
  Administered 2014-06-07: 10 ug/kg/min via INTRAVENOUS
  Administered 2014-06-07: 30 ug/kg/min via INTRAVENOUS
  Administered 2014-06-07 – 2014-06-09 (×6): 50 ug/kg/min via INTRAVENOUS
  Filled 2014-06-07 (×10): qty 100

## 2014-06-07 MED ORDER — SODIUM CHLORIDE 0.9 % IV SOLN
INTRAVENOUS | Status: DC
  Administered 2014-06-07: 02:00:00 via INTRAVENOUS

## 2014-06-07 MED ORDER — PANTOPRAZOLE SODIUM 40 MG IV SOLR
40.0000 mg | Freq: Every day | INTRAVENOUS | Status: DC
  Administered 2014-06-07 (×2): 40 mg via INTRAVENOUS
  Filled 2014-06-07 (×3): qty 40

## 2014-06-07 MED ORDER — HEPARIN SODIUM (PORCINE) 5000 UNIT/ML IJ SOLN
5000.0000 [IU] | Freq: Three times a day (TID) | INTRAMUSCULAR | Status: DC
  Administered 2014-06-07 – 2014-06-11 (×12): 5000 [IU] via SUBCUTANEOUS
  Filled 2014-06-07 (×16): qty 1

## 2014-06-07 MED ORDER — SODIUM CHLORIDE 0.9 % IV SOLN
25.0000 ug/h | INTRAVENOUS | Status: DC
  Administered 2014-06-07: 25 ug/h via INTRAVENOUS
  Filled 2014-06-07: qty 50

## 2014-06-07 MED ORDER — SODIUM CHLORIDE 0.9 % IJ SOLN
10.0000 mL | Freq: Two times a day (BID) | INTRAMUSCULAR | Status: DC
  Administered 2014-06-07 – 2014-06-11 (×9): 10 mL

## 2014-06-07 NOTE — Progress Notes (Signed)
eLink Physician-Brief Progress Note Patient Name: Lysle MoralesSarah A Brave DOB: [birth year] MRN: [id number]   Date of Service  06/07/2014  HPI/Events of Note  Hypokalemia and hypophosphatemia in ESRD patient  eICU Interventions  Potassium and Phos replaced at reduced dosing        DETERDING,ELIZABETH 06/07/2014, 5:05 AM

## 2014-06-07 NOTE — Progress Notes (Signed)
Patient ID: DASHIA CALDEIRA, female   DOB: 02/05/1946, 69 y.o.   MRN: [id number]   SUBJECTIVE: Remains intubated/sedated this morning, cooled to 36 degrees.  No further CHB/bradycardia.  BP stable.   Scheduled Meds: . sodium chloride  2,000 mL Intravenous Once  . antiseptic oral rinse  7 mL Mouth Rinse QID  . chlorhexidine  15 mL Mouth Rinse BID  . famotidine (PEPCID) IV  20 mg Intravenous Q12H  . fentaNYL  50 mcg Intravenous Once  . heparin  5,000 Units Subcutaneous 3 times per day  . pantoprazole (PROTONIX) IV  40 mg Intravenous QHS  . potassium phosphate IVPB (mmol)  10 mmol Intravenous Once  . sodium chloride  10-40 mL Intracatheter Q12H   Continuous Infusions: . sodium chloride 10 mL/hr at 06/07/14 0227  . sodium chloride 10 mL/hr at 06/07/14 0227  . epinephrine Stopped (06/25/2014 2300)  . fentaNYL infusion INTRAVENOUS Stopped (06/07/14 0210)  . norepinephrine (LEVOPHED) Adult infusion 1 mcg/min (06/07/14 0705)  . propofol 50 mcg/kg/min (06/07/14 0738)   PRN Meds:.sodium chloride, acetaminophen, fentaNYL, hydrALAZINE, midazolam, midazolam, ondansetron (ZOFRAN) IV, sodium chloride    Filed Vitals:   06/07/14 0500 06/07/14 0700 06/07/14 0800 06/07/14 0818  BP: 135/47   131/46  Pulse: 55   56  Temp: 96.8 F (36 C) 96.8 F (36 C) 96.8 F (36 C)   TempSrc: Core (Comment) Core (Comment) Core (Comment)   Resp: 10   9  Height:      Weight: 107 lb 12.9 oz (48.9 kg)     SpO2: 100%   100%    Intake/Output Summary (Last 24 hours) at 06/07/14 0907 Last data filed at 06/07/14 0800  Gross per 24 hour  Intake 601.57 ml  Output      0 ml  Net 601.57 ml    LABS: Basic Metabolic Panel:  Recent Labs  40/98/11 2245 06/07/14 0400  NA 142 140  K 4.6 3.4*  CL 102 101  CO2 30 30  GLUCOSE 156* 134*  BUN 26* 32*  CREATININE 3.02* 3.13*  CALCIUM 8.3* 8.1*  MG 2.0 1.8  PHOS 3.3 1.9*   Liver Function Tests:  Recent Labs  06/24/2014 1907 06/08/2014 2245  AST 194* 257*  ALT  190* 204*  ALKPHOS 80 86  BILITOT 0.9 1.3*  PROT 6.6 6.6  ALBUMIN 3.2* 3.3*   No results for input(s): LIPASE, AMYLASE in the last 72 hours. CBC:  Recent Labs  06/13/2014 1907 06/10/2014 2245 06/07/14 0400  WBC 16.8* 13.9* 15.8*  NEUTROABS 11.3* 12.5*  --   HGB 10.2* 9.8* 9.0*  HCT 31.2* 29.1* 26.2*  MCV 98.7 93.6 93.2  PLT 301 279 240   Cardiac Enzymes:  Recent Labs  06/17/2014 1907 06/17/2014 2245 06/07/14 0400  TROPONINI 0.09* 0.38* 0.76*   BNP: Invalid input(s): POCBNP D-Dimer: No results for input(s): DDIMER in the last 72 hours. Hemoglobin A1C: No results for input(s): HGBA1C in the last 72 hours. Fasting Lipid Panel:  Recent Labs  06/07/14 0300  TRIG 29   Thyroid Function Tests: No results for input(s): TSH, T4TOTAL, T3FREE, THYROIDAB in the last 72 hours.  Invalid input(s): FREET3 Anemia Panel: No results for input(s): VITAMINB12, FOLATE, FERRITIN, TIBC, IRON, RETICCTPCT in the last 72 hours.  RADIOLOGY: Dg Chest Port 1 View  06/21/2014   CLINICAL DATA:  Cardiac arrest.  Assess endotracheal tube.  EXAM: PORTABLE CHEST - 1 VIEW  COMPARISON:  Earlier film, same date.  FINDINGS: The endotracheal tube is in  good position, 6.5 cm above the carina. The NG tube is coursing down into the stomach. External pacer pad holes are noted. The lungs are slightly better aerated with less edema.  IMPRESSION: Endotracheal tube in good position, 6.5 cm above the carina.  Slight improved lung aeration with resolving pulmonary edema.   Electronically Signed   By: Loralie ChampagneMark  Gallerani M.D.   On: 06/19/2014 23:29   Dg Chest Portable 1 View  06/05/2014   CLINICAL DATA:  Cardiac arrest.  EXAM: PORTABLE CHEST - 1 VIEW  COMPARISON:  04/11/2014.  FINDINGS: Nasogastric tube is followed into the stomach. Trachea is midline. Heart is enlarged. Thoracic aorta is calcified. Mild diffuse interstitial prominence and indistinctness. No definite pleural fluid. No pneumothorax.  IMPRESSION: Mild edema.    Electronically Signed   By: Leanna BattlesMelinda  Blietz M.D.   On: 06/03/2014 19:51   Dg Chest Port 1v Same Day  06/14/2014   CLINICAL DATA:  Cardiac arrest, intubation.  EXAM: PORTABLE CHEST - 1 VIEW SAME DAY  COMPARISON:  06/01/2014.  FINDINGS: Endotracheal tube terminates approximately 1.5 cm above carina. Nasogastric tube terminates in the stomach. Heart is enlarged. Mild diffuse interstitial prominence and indistinctness, as before. Calcified granuloma in the lingula. No definite pleural fluid. Defibrillator pads project over the lower left hemi thorax.  IMPRESSION: 1. Endotracheal tube is low lying. Retracting approximately 2-3 cm would better position the tip above the carina. These results were called by telephone at the time of interpretation on 06/05/2014 at 8:12 pm to Dr. Mancel BaleELLIOTT WENTZ , who verbally acknowledged these results. 2. Mild edema.   Electronically Signed   By: Leanna BattlesMelinda  Blietz M.D.   On: 06/24/2014 20:12   Ct Portable Head W/o Cm  06/12/2014   CLINICAL DATA:  Cardiac arrest.  EXAM: CT HEAD WITHOUT CONTRAST  TECHNIQUE: Contiguous axial images were obtained from the base of the skull through the vertex without intravenous contrast.  COMPARISON:  04/11/2014  FINDINGS: Stable age related cerebral atrophy, ventriculomegaly and periventricular white matter disease. No acute intracranial findings. The gray-white differentiation is maintained. No CT findings to suggest global anoxia.  IMPRESSION: Exam limited by motion artifact but no definite acute intracranial abnormality.  Chronic changes as discussed above.   Electronically Signed   By: Loralie ChampagneMark  Gallerani M.D.   On: 06/09/2014 23:47    PHYSICAL EXAM General: Intubated/sedated Neck: No JVD, no thyromegaly or thyroid nodule.  Lungs: Rhonchi bilaterally CV: Nondisplaced PMI.  Heart regular S1/S2, no S3/S4, no murmur.  No peripheral edema.   Abdomen: Soft, nontender, no hepatosplenomegaly, no distention.  Neurologic: Concern for decerebrate posturing    Extremities: No clubbing or cyanosis.   TELEMETRY: Reviewed telemetry pt in NSR, LBBB  ASSESSMENT AND PLAN: 69 yo with history of ESRD and nonischemic cardiomyopathy (EF 20-25% in 2012) was admitted after cardiac arrest.  She choked on a piece of ham and developed cardiac arrest with unshockable rhythm likely due to hypoxia.  She was down about 40 minutes.  She was noted to have transient CHB with RBBB escape and briefly required transcutaneous pacing.  She then resumed NSR with 1st degree AVB and LBBB.  No further CHB this morning.  There is concern for decerebrate posturing.  - She has shown evidence for significant conduction system disease.  However, given prolonged down-time and possible decerebrate posturing, I suspect her chances of neurologic recovery are not great. She will need neurology evaluation.  - Reasonable to reassess EF with echo.  - Keep pacing pads at  bedside.  Rhythm currently stable.  Would not transvenously pace.   Marca Ancona 06/07/2014 9:11 AM

## 2014-06-07 NOTE — H&P (Signed)
PULMONARY / CRITICAL CARE MEDICINE   Name: [name] MRN: [id number] DOB: [birth year]    ADMISSION DATE:  06/12/2014 CONSULTATION DATE:  06/19/2014  [facility] :  [facility]  CHIEF COMPLAINT:  S/p cardiac arrest  INITIAL PRESENTATION: 89F ESRD on HD, choked on her food at home after dialysis, resulting in cardiac arrest with prolonged resuscitation (more than 40 minutes) with ROSC. Possibly decerebrating. Off pacer, off pressor. A piece of ham was removed from her throat during intubation.  STUDIES:  CT head 06/09/2014: no acute process, chronic changes   SIGNIFICANT EVENTS: Intubated 06/16/2014   SUBJECTIVE: remains unresponsive  Family updated   VITAL SIGNS: Temp:  [93 F (33.9 C)-99.5 F (37.5 C)] 96.8 F (36 C) (02/07 1000) Pulse Rate:  [55-140] 57 (02/07 1000) Resp:  [0-33] 9 (02/07 1000) BP: (118-280)/(41-112) 136/46 mmHg (02/07 1000) SpO2:  [81 %-100 %] 100 % (02/07 1000) Arterial Line BP: (126-203)/(45-83) 142/49 mmHg (02/07 1000) FiO2 (%):  [40 %-100 %] 40 % (02/07 0818) Weight:  [48.9 kg (107 lb 12.9 oz)-50.7 kg (111 lb 12.4 oz)] 48.9 kg (107 lb 12.9 oz) (02/07 0500) HEMODYNAMICS: CVP:  [8 mmHg] 8 mmHg VENTILATOR SETTINGS: Vent Mode:  [-] PRVC FiO2 (%):  [40 %-100 %] 40 % Set Rate:  [14 bmp-20 bmp] 20 bmp Vt Set:  [400 mL-530 mL] 400 mL PEEP:  [5 cmH20] 5 cmH20 Plateau Pressure:  [20 cmH20-25 cmH20] 22 cmH20 INTAKE / OUTPUT:  Intake/Output Summary (Last 24 hours) at 06/07/14 1047 Last data filed at 06/07/14 0800  Gross per 24 hour  Intake 601.57 ml  Output      0 ml  Net 601.57 ml    PHYSICAL EXAMINATION: General:  Unresponsive, possible decerebrate posturing Neuro:  decrebrate posturing on pain stimuli, pupils equal and sluggish HEENT:  Atraumatic, ET tube in place,   Cardiovascular:  RRR, no loud murmur Lungs:  Scattered rhonchi, no wheeze Abdomen:  Soft, no guarding, large ventral abdominal hernia Musculoskeletal:  No gross deformities, no edema,  fistula present at left arm Skin:  No ecchymosis  LABS:  CBC  Recent Labs Lab 06/02/2014 1907 06/24/2014 2245 06/07/14 0400  WBC 16.8* 13.9* 15.8*  HGB 10.2* 9.8* 9.0*  HCT 31.2* 29.1* 26.2*  PLT 301 279 240   Coag's  Recent Labs Lab 06/10/2014 2245  APTT 33  INR 1.18   BMET  Recent Labs Lab 06/10/2014 1907 06/12/2014 2245 06/07/14 0400  NA 140 142 140  K 3.7 4.6 3.4*  CL 100 102 101  CO2 BUN 23 26* 32*  CREATININE 3.02* 3.02* 3.13*  GLUCOSE 205* 156* 134*   Electrolytes  Recent Labs Lab 06/05/2014 1907 06/05/2014 2245 06/07/14 0400  CALCIUM 7.9* 8.3* 8.1*  MG  --  2.0 1.8  PHOS  --  3.3 1.9*   Sepsis Markers  Recent Labs Lab 06/19/2014 2300  LATICACIDVEN 1.7   ABG  Recent Labs Lab 06/03/2014 1920 06/13/2014 2250 06/07/14 0343  PHART 7.394 7.462* 7.496*  PCO2ART 38.5 46.6* 38.2  PO2ART 505.0* 495.0* 72.0*   Liver Enzymes  Recent Labs Lab 06/08/2014 1907 06/10/2014 2245  AST 194* 257*  ALT 190* 204*  ALKPHOS 80 86  BILITOT 0.9 1.3*  ALBUMIN 3.2* 3.3*   Cardiac Enzymes  Recent Labs Lab 06/04/2014 1907 06/14/2014 2245 06/07/14 0400  TROPONINI 0.09* 0.38* 0.76*   Glucose  Recent Labs Lab 06/05/2014 2241 06/24/2014 2337 06/07/14 0353 06/07/14 0726  GLUCAP 144* 140* 130* 120*  Imaging Dg Chest Port 1 View  06/01/2014   CLINICAL DATA:  Cardiac arrest.  Assess endotracheal tube.  EXAM: PORTABLE CHEST - 1 VIEW  COMPARISON:  Earlier film, same date.  FINDINGS: The endotracheal tube is in good position, 6.5 cm above the carina. The NG tube is coursing down into the stomach. External pacer pad holes are noted. The lungs are slightly better aerated with less edema.  IMPRESSION: Endotracheal tube in good position, 6.5 cm above the carina.  Slight improved lung aeration with resolving pulmonary edema.   Electronically Signed   By: Loralie ChampagneMark  Gallerani M.D.   On: 06/18/2014 23:29   Dg Chest Portable 1 View  06/27/2014   CLINICAL DATA:  Cardiac arrest.   EXAM: PORTABLE CHEST - 1 VIEW  COMPARISON:  04/11/2014.  FINDINGS: Nasogastric tube is followed into the stomach. Trachea is midline. Heart is enlarged. Thoracic aorta is calcified. Mild diffuse interstitial prominence and indistinctness. No definite pleural fluid. No pneumothorax.  IMPRESSION: Mild edema.   Electronically Signed   By: Leanna BattlesMelinda  Blietz M.D.   On: 06/25/2014 19:51   Dg Chest Port 1v Same Day  06/04/2014   CLINICAL DATA:  Cardiac arrest, intubation.  EXAM: PORTABLE CHEST - 1 VIEW SAME DAY  COMPARISON:  06/21/2014.  FINDINGS: Endotracheal tube terminates approximately 1.5 cm above carina. Nasogastric tube terminates in the stomach. Heart is enlarged. Mild diffuse interstitial prominence and indistinctness, as before. Calcified granuloma in the lingula. No definite pleural fluid. Defibrillator pads project over the lower left hemi thorax.  IMPRESSION: 1. Endotracheal tube is low lying. Retracting approximately 2-3 cm would better position the tip above the carina. These results were called by telephone at the time of interpretation on 06/09/2014 at 8:12 pm to Dr. Mancel BaleELLIOTT WENTZ , who verbally acknowledged these results. 2. Mild edema.   Electronically Signed   By: Leanna BattlesMelinda  Blietz M.D.   On: 06/08/2014 20:12   Ct Portable Head W/o Cm  06/19/2014   CLINICAL DATA:  Cardiac arrest.  EXAM: CT HEAD WITHOUT CONTRAST  TECHNIQUE: Contiguous axial images were obtained from the base of the skull through the vertex without intravenous contrast.  COMPARISON:  04/11/2014  FINDINGS: Stable age related cerebral atrophy, ventriculomegaly and periventricular white matter disease. No acute intracranial findings. The gray-white differentiation is maintained. No CT findings to suggest global anoxia.  IMPRESSION: Exam limited by motion artifact but no definite acute intracranial abnormality.  Chronic changes as discussed above.   Electronically Signed   By: Loralie ChampagneMark  Gallerani M.D.   On: 06/25/2014 23:47     ASSESSMENT /  PLAN:  PULMONARY OETT 06/02/2014 A: Hypoxic resp failure from choking resulting in prolonged cardiac arrest, P:   Vent support and wean as able  VAP bundle.   F/u ABG   CARDIOVASCULAR CVL left femoral 06/18/2014>>> A: s/p cardiac arrest, nonshockable, likely related to hypoxia>normothermia protocol 2/6 Transient hypotension and bradycardia now off pacer and off epi drip on pressors   HTN-home rx on hold  Nonischemic CM -EF 20-25% 2012 -cardiolgy following  Troponin bump ?from CPR  2/7 : reached temp goal this am . P:  Normothermia protocol. Wean pressors as able  Hep /DVT  Echo pending  Cards following     RENAL A:  ESRD on HD-last HD was on Sat 2/6 (reg schedule is T/TH/SAT)  Hypokalemia Hypophos  P:   Check electrolytes and replace as indicated  Get renal consult on Monday for HD every TThS  GASTROINTESTINAL A:  Increased LFT ?  shock liver   P:   NPO  PPI  Check LFT in am   HEMATOLOGIC A:  Chronic anemia likely related to ESRD Question of DIC (was oozing some blood during procedure)>INR nml/fibrinogen nml  P:  Transfuse for hbg <7   INFECTIOUS A:  No acute issue currently P:   BCx2 2/7 UC 2/7 Sputum 2/7  Abx:   ENDOCRINE A:  Known DM   P:   Fingerstick Q4, goal 140-180 Add SSI if >180   NEUROLOGIC A:  Encephalopathy likely from prolonged cardiac arrest, question of decerebrate posturing CT head 2/6>no acute process, chronic changes   2/7 : family discussion  P:   Sedation As needed    RASS goal: 0 to -1 Neuro consult    FAMILY  - Updates: family 2/7   - Inter-disciplinary family meet or Palliative Care meeting due by:  2/13    TODAY'S SUMMARY: Choked on food, resulting in cardiorespiratory arrest, prolonged code with transient bradycardia and hypotension, off pacer and off epi drip. On vent.  Decerebrate posturing . CT head without acute changes . Consider Neuro consult. Poor prognosis  Spoke with family , very upset. Pt wheelchair bound on HD  PTA. Cards following with known CM with EF 20%. Echo pending . Family says she would not want prolonged vent if no meaningful recovery.   Tammy Parrett NP-C  [facility]  [phone]    06/07/2014, 10:47 AM

## 2014-06-07 NOTE — Progress Notes (Signed)
eLink Physician-Brief Progress Note Patient Name: Shelby MoralesSarah A Valli DOB: 17-Feb-1946 MRN: [id number]   Date of Service  06/07/2014  HPI/Events of Note  Patient observed on camera to have rhythmic movements of the lower jaw/mouth also with decerebrate movements of the arms.    eICU Interventions  Plan: Add propofol sedation Already on prn versed Will need neuro evaluation         DETERDING,ELIZABETH 06/07/2014, 1:42 AM

## 2014-06-07 NOTE — Progress Notes (Signed)
Utilization review completed.  

## 2014-06-07 NOTE — Progress Notes (Signed)
Normothermia temperature goal of 36 degrees reached at 0500 on 06/07/14.  Toula MoosABERION, Asante Ritacco J

## 2014-06-07 NOTE — Progress Notes (Signed)
  Echocardiogram 2D Echocardiogram has been performed.  Shelby EvertsRix, [name] A 06/07/2014, 3:44 PM

## 2014-06-08 ENCOUNTER — Inpatient Hospital Stay (HOSPITAL_COMMUNITY): Payer: Medicare Other

## 2014-06-08 DIAGNOSIS — T17920A Food in respiratory tract, part unspecified causing asphyxiation, initial encounter: Secondary | ICD-10-CM | POA: Insufficient documentation

## 2014-06-08 DIAGNOSIS — G40301 Generalized idiopathic epilepsy and epileptic syndromes, not intractable, with status epilepticus: Secondary | ICD-10-CM

## 2014-06-08 DIAGNOSIS — G40911 Epilepsy, unspecified, intractable, with status epilepticus: Secondary | ICD-10-CM

## 2014-06-08 DIAGNOSIS — R569 Unspecified convulsions: Secondary | ICD-10-CM

## 2014-06-08 DIAGNOSIS — T17920D Food in respiratory tract, part unspecified causing asphyxiation, subsequent encounter: Secondary | ICD-10-CM

## 2014-06-08 DIAGNOSIS — E43 Unspecified severe protein-calorie malnutrition: Secondary | ICD-10-CM | POA: Insufficient documentation

## 2014-06-08 DIAGNOSIS — J9600 Acute respiratory failure, unspecified whether with hypoxia or hypercapnia: Secondary | ICD-10-CM

## 2014-06-08 LAB — POCT I-STAT 3, ART BLOOD GAS (G3+)
Acid-Base Excess: 3 mmol/L — ABNORMAL HIGH (ref 0.0–2.0)
Bicarbonate: 27.3 mEq/L — ABNORMAL HIGH (ref 20.0–24.0)
O2 Saturation: 99 %
PO2 ART: 146 mmHg — AB (ref 80.0–100.0)
TCO2: 29 mmol/L (ref 0–100)
pCO2 arterial: 38 mmHg (ref 35.0–45.0)
pH, Arterial: 7.461 — ABNORMAL HIGH (ref 7.350–7.450)

## 2014-06-08 LAB — COMPREHENSIVE METABOLIC PANEL
ALT: 85 U/L — ABNORMAL HIGH (ref 0–35)
ANION GAP: 7 (ref 5–15)
AST: 64 U/L — ABNORMAL HIGH (ref 0–37)
Albumin: 2 g/dL — ABNORMAL LOW (ref 3.5–5.2)
Alkaline Phosphatase: 50 U/L (ref 39–117)
BUN: 30 mg/dL — ABNORMAL HIGH (ref 6–23)
CO2: 22 mmol/L (ref 19–32)
CREATININE: 2.99 mg/dL — AB (ref 0.50–1.10)
Calcium: 5.8 mg/dL — CL (ref 8.4–10.5)
Chloride: 115 mmol/L — ABNORMAL HIGH (ref 96–112)
GFR calc Af Amer: 17 mL/min — ABNORMAL LOW (ref >90)
GFR calc non Af Amer: 15 mL/min — ABNORMAL LOW (ref >90)
Glucose, Bld: 50 mg/dL — ABNORMAL LOW (ref 70–99)
Potassium: 2.9 mmol/L — ABNORMAL LOW (ref 3.5–5.1)
SODIUM: 144 mmol/L (ref 135–145)
Total Bilirubin: 0.5 mg/dL (ref 0.3–1.2)
Total Protein: 4 g/dL — ABNORMAL LOW (ref 6.0–8.3)

## 2014-06-08 LAB — CBC
HCT: 20 % — ABNORMAL LOW (ref 36.0–46.0)
HCT: 25.3 % — ABNORMAL LOW (ref 36.0–46.0)
HEMOGLOBIN: 6.7 g/dL — AB (ref 12.0–15.0)
Hemoglobin: 8.7 g/dL — ABNORMAL LOW (ref 12.0–15.0)
MCH: 31.5 pg (ref 26.0–34.0)
MCH: 32.3 pg (ref 26.0–34.0)
MCHC: 33.5 g/dL (ref 30.0–36.0)
MCHC: 34.4 g/dL (ref 30.0–36.0)
MCV: 93.9 fL (ref 78.0–100.0)
MCV: 94.1 fL (ref 78.0–100.0)
PLATELETS: 152 10*3/uL (ref 150–400)
PLATELETS: 198 10*3/uL (ref 150–400)
RBC: 2.13 MIL/uL — ABNORMAL LOW (ref 3.87–5.11)
RBC: 2.69 MIL/uL — ABNORMAL LOW (ref 3.87–5.11)
RDW: 13.9 % (ref 11.5–15.5)
RDW: 14 % (ref 11.5–15.5)
WBC: 7.9 10*3/uL (ref 4.0–10.5)
WBC: 9.6 10*3/uL (ref 4.0–10.5)

## 2014-06-08 LAB — POCT I-STAT, CHEM 8
BUN: 37 mg/dL — AB (ref 6–23)
CALCIUM ION: 1.01 mmol/L — AB (ref 1.13–1.30)
Chloride: 100 mmol/L (ref 96–112)
Creatinine, Ser: 4.2 mg/dL — ABNORMAL HIGH (ref 0.50–1.10)
Glucose, Bld: 75 mg/dL (ref 70–99)
HCT: 24 % — ABNORMAL LOW (ref 36.0–46.0)
Hemoglobin: 8.2 g/dL — ABNORMAL LOW (ref 12.0–15.0)
Potassium: 4.1 mmol/L (ref 3.5–5.1)
Sodium: 140 mmol/L (ref 135–145)
TCO2: 23 mmol/L (ref 0–100)

## 2014-06-08 LAB — PHOSPHORUS: PHOSPHORUS: 3.5 mg/dL (ref 2.3–4.6)

## 2014-06-08 LAB — GLUCOSE, CAPILLARY
Glucose-Capillary: 73 mg/dL (ref 70–99)
Glucose-Capillary: 65 mg/dL — ABNORMAL LOW (ref 70–99)
Glucose-Capillary: 66 mg/dL — ABNORMAL LOW (ref 70–99)
Glucose-Capillary: 197 mg/dL — ABNORMAL HIGH (ref 70–99)

## 2014-06-08 LAB — MAGNESIUM: Magnesium: 1.4 mg/dL — ABNORMAL LOW (ref 1.5–2.5)

## 2014-06-08 MED ORDER — SODIUM CHLORIDE 0.9 % IV SOLN
INTRAVENOUS | Status: DC
  Administered 2014-06-08: 21:00:00 via INTRAVENOUS
  Administered 2014-06-10: 10 mL via INTRAVENOUS

## 2014-06-08 MED ORDER — LORAZEPAM 2 MG/ML IJ SOLN: 2.0000 mg | INTRAMUSCULAR | Status: DC | PRN

## 2014-06-08 MED ORDER — ACETAMINOPHEN 160 MG/5ML PO SOLN: 650.0000 mg | Freq: Four times a day (QID) | ORAL | Status: DC | PRN

## 2014-06-08 MED ORDER — DEXTROSE 50 % IV SOLN
INTRAVENOUS | Status: AC
  Administered 2014-06-08: 50 mL
  Filled 2014-06-08: qty 50

## 2014-06-08 MED ORDER — PRO-STAT SUGAR FREE PO LIQD
30.0000 mL | Freq: Four times a day (QID) | ORAL | Status: DC
  Administered 2014-06-08 – 2014-06-10 (×11): 30 mL
  Filled 2014-06-08 (×15): qty 30

## 2014-06-08 MED ORDER — VALPROATE SODIUM 500 MG/5ML IV SOLN: 500.0000 mg | Freq: Once | INTRAVENOUS | Status: DC

## 2014-06-08 MED ORDER — VITAL HIGH PROTEIN PO LIQD
1000.0000 mL | ORAL | Status: DC
  Filled 2014-06-08: qty 1000

## 2014-06-08 MED ORDER — VALPROATE SODIUM 500 MG/5ML IV SOLN
500.0000 mg | Freq: Three times a day (TID) | INTRAVENOUS | Status: DC
  Administered 2014-06-08 – 2014-06-10 (×8): 500 mg via INTRAVENOUS
  Filled 2014-06-08 (×11): qty 5

## 2014-06-08 MED ORDER — LORAZEPAM 2 MG/ML IJ SOLN
INTRAMUSCULAR | Status: AC
  Filled 2014-06-08: qty 2

## 2014-06-08 MED ORDER — LORAZEPAM 2 MG/ML IJ SOLN
4.0000 mg | Freq: Once | INTRAMUSCULAR | Status: AC
  Administered 2014-06-08: 4 mg via INTRAVENOUS

## 2014-06-08 MED ORDER — LEVETIRACETAM IN NACL 1000 MG/100ML IV SOLN
1000.0000 mg | Freq: Two times a day (BID) | INTRAVENOUS | Status: DC
  Administered 2014-06-08 – 2014-06-09 (×2): 1000 mg via INTRAVENOUS
  Filled 2014-06-08 (×4): qty 100

## 2014-06-08 MED ORDER — ACETAMINOPHEN 650 MG RE SUPP: 650.0000 mg | RECTAL | Status: DC | PRN

## 2014-06-08 MED ORDER — VITAL HIGH PROTEIN PO LIQD
1000.0000 mL | ORAL | Status: DC
  Filled 2014-06-08 (×3): qty 1000

## 2014-06-08 MED ORDER — VITAL HIGH PROTEIN PO LIQD
1000.0000 mL | ORAL | Status: DC
  Administered 2014-06-08: 20:00:00
  Administered 2014-06-08 – 2014-06-10 (×3): 1000 mL
  Filled 2014-06-08 (×7): qty 1000

## 2014-06-08 MED ORDER — PANTOPRAZOLE SODIUM 40 MG PO PACK
40.0000 mg | PACK | ORAL | Status: DC
  Administered 2014-06-08 – 2014-06-10 (×3): 40 mg
  Filled 2014-06-08 (×5): qty 20

## 2014-06-08 MED ORDER — VALPROATE SODIUM 500 MG/5ML IV SOLN: 1500.0000 mg | Freq: Once | INTRAVENOUS | Status: DC

## 2014-06-08 MED ORDER — VALPROATE SODIUM 500 MG/5ML IV SOLN
1500.0000 mg | Freq: Once | INTRAVENOUS | Status: AC
  Administered 2014-06-08: 1500 mg via INTRAVENOUS
  Filled 2014-06-08 (×2): qty 15

## 2014-06-08 MED ORDER — INSULIN ASPART 100 UNIT/ML ~~LOC~~ SOLN: 0.0000 [IU] | SUBCUTANEOUS | Status: DC

## 2014-06-08 MED ORDER — HYDRALAZINE HCL 20 MG/ML IJ SOLN: 5.0000 mg | INTRAMUSCULAR | Status: DC | PRN

## 2014-06-08 MED ORDER — FENTANYL CITRATE 0.05 MG/ML IJ SOLN: 25.0000 ug | INTRAMUSCULAR | Status: DC | PRN

## 2014-06-08 MED ORDER — DEXTROSE 5 % IV SOLN
0.0000 ug/min | INTRAVENOUS | Status: DC
  Filled 2014-06-08: qty 4

## 2014-06-08 MED FILL — Medication: Qty: 1 | Status: AC

## 2014-06-08 NOTE — Progress Notes (Signed)
Hypoglycemic Event  CBG: 65  Treatment: 1 amp D50  Symptoms: None   Follow-up CBG: Time:8:00 CBG Result:197  Possible Reasons for Event:NPO   Comments/MD notified:Yes     [name], [name]  Remember to initiate Hypoglycemia Order Set & complete

## 2014-06-08 NOTE — Consult Note (Addendum)
NEURO HOSPITALIST CONSULT NOTE    Reason for Consult: seizure.   HPI:                                                                                                                                          [name] is an 69 y.o. female presenting to the hospital after choking on her food at home. patein went into cardiac arrest and had a prolonged resuscitation (>40 minutes).   While hospitalized she was noted to have rhythmic movements of lower jaw/mouth with possible decerebrate movements. EEG was obtained showing status epilepticus involving the right hemisphere with some spread to the left vs a generalized status epilepticus with left cortical injury > right causing attenuation on the left side. Patient was loaded with Depakote and EEG was called to reevaluate. Currently she is on Propofol, non reactive to voice and noxious stimuli.  She currently is still under the cooling phase.   Cr. 4.20 --dialysis patient AST 64 ALT 85 Glucose of 75 Temp 96.8 BP 170/53  CT head imaging reviewed and overall unremarkable.   Past Medical History  Diagnosis Date  . Type 2 diabetes mellitus   . Hypercholesteremia   . Essential hypertension, benign   . UTI (urinary tract infection)   . Ruptured appendix   . Iron deficiency anemia   . Noncompliance   . Renal insufficiency     Dialysis Tues, Thurs, & Sat  . Dialysis patient     Tuesday, Thursday and Saturday   . GERD (gastroesophageal reflux disease)   . 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 Appendectomy  2008    Exploratory laparotomy  . Left carpal tunnel sugrey    . Stitch granuloma excision  2009  . Esophagogastroduodenoscopy  01/19/2011    Procedure: ESOPHAGOGASTRODUODENOSCOPY (EGD);  Surgeon: Malissa Hippo, MD;  Location: [facility];  Service: Endoscopy;  Laterality: N/A;  . Abdominal hysterectomy    . Tonsillectomy    . Cataract  extraction w/phaco  07/10/2011    Procedure: CATARACT EXTRACTION PHACO AND INTRAOCULAR LENS PLACEMENT (IOC);  Surgeon: Gemma Payor, MD;  Location: [facility];  Service: Ophthalmology;  Laterality: Left;  CDE: 20.36  . Cataract extraction w/phaco  08/07/2011    Procedure: CATARACT EXTRACTION PHACO AND INTRAOCULAR LENS PLACEMENT (IOC);  Surgeon: Gemma Payor, MD;  Location: [facility];  Service: Ophthalmology;  Laterality: Right;  CDE:15.31    Family History  Problem Relation Age of Onset  . Hypertension    . Anesthesia problems Neg Hx   . Hypotension Neg Hx   . Malignant hyperthermia Neg Hx   . Pseudochol deficiency Neg Hx      Social  History:  reports that she has never smoked. She has never used smokeless tobacco. She reports that she does not drink alcohol or use illicit drugs.  No Known Allergies  MEDICATIONS:                                                                                                                     Prior to Admission:  Prescriptions prior to admission  Medication Sig Dispense Refill Last Dose  . amLODipine (NORVASC) 10 MG tablet Take 10 mg by mouth daily.   unknown  . b complex-vitamin c-folic acid (NEPHRO-VITE) 0.8 MG TABS tablet Take 1 tablet by mouth daily.   unknown  . carvedilol (COREG) 12.5 MG tablet Take 12.5 mg by mouth 2 (two) times daily.   unknown  . cloNIDine (CATAPRES) 0.1 MG tablet Take 0.1 mg by mouth 2 (two) times daily. Take before dialysis   unknown  . HYDROcodone-acetaminophen (NORCO/VICODIN) 5-325 MG per tablet Take 1 tablet by mouth every 6 (six) hours as needed (pain).   unknown  . isosorbide mononitrate (IMDUR) 60 MG 24 hr tablet Take 60 mg by mouth daily.   unknown  . meclizine (ANTIVERT) 12.5 MG tablet Take 12.5 mg by mouth every 4 (four) hours as needed for dizziness.   unknown  . Nutritional Supplements (NEPRO) LIQD Take 1 Can by mouth 2 (two) times daily.   unknown  . pantoprazole (PROTONIX) 40 MG tablet Take 40 mg by mouth daily.   unknown   . sevelamer carbonate (RENVELA) 800 MG tablet Take 800-2,400 mg by mouth 3 (three) times daily with meals. Patient takes 3 tabs with meals and 1 tab with snack   unknown   Scheduled: . antiseptic oral rinse  7 mL Mouth Rinse QID  . chlorhexidine  15 mL Mouth Rinse BID  . feeding supplement (VITAL HIGH PROTEIN)  1,000 mL Per Tube Q24H  . heparin  5,000 Units Subcutaneous 3 times per day  . insulin aspart  0-9 Units Subcutaneous 6 times per day  . pantoprazole sodium  40 mg Per Tube Q24H  . sodium chloride  10-40 mL Intracatheter Q12H  . valproate sodium  1,500 mg Intravenous Once  . valproate sodium  500 mg Intravenous TID     ROS:  History obtained from unobtainable from patient due to mental status   Blood pressure 104/46, pulse 67, temperature 96.8 F (36 C), temperature source Core (Comment), resp. rate 26, height  (1.549 m), weight 52.7 kg (116 lb 2.9 oz), SpO2 100 %.   Neurologic Examination:                                                                                                      HEENT-  Normocephalic, no lesions, without obvious abnormality.  Normal external eye and conjunctiva.  Normal TM's bilaterally.  Normal auditory canals and external ears. Normal external nose, mucus membranes and septum.  Normal pharynx. Cardiovascular- S1, S2 normal, pulses palpable throughout   Lungs- chest clear, no wheezing, rales, normal symmetric air entry Abdomen- normal findings: bowel sounds normal Extremities- less then 2 second capillary refill Lymph-no adenopathy palpable Musculoskeletal-no joint tenderness, deformity or swelling Skin-warm and dry, no hyperpigmentation, vitiligo, or suspicious lesions  Neurological Examination Mental Status: Patient does not respond to verbal stimuli.  Does not respond to deep sternal rub.  Does not follow  commands.  No verbalizations noted.  Cranial Nerves: II: patient does not respond confrontation bilaterally, pupils right 2 mm, left 2 mm,and non-reactive bilaterally III,IV,VI: doll's response absent bilaterally.  V,VII: corneal reflex absent bilaterally  VIII: patient does not respond to verbal stimuli IX,X: gag reflex absent, XI: trapezius strength unable to test bilaterally XII: tongue strength unable to test Motor: Extremities flaccid throughout.  No spontaneous movement noted.  No purposeful movements noted. Sensory: Does not respond to noxious stimuli in in UE with triple reflex in bilateral LE. Deep Tendon Reflexes:  2+ throughout Plantars: upgoing bilaterally Cerebellar: Unable to perform      Lab Results: Basic Metabolic Panel:  Recent Labs Lab 06/05/2014 1907 06/08/2014 2245 06/07/14 0400 06/07/14 1130 06/08/14 0400 06/08/14 0524  NA 140 142 140 139 144 140  K 3.7 4.6 3.4* 3.8 2.9* 4.1  CL 100 102 101 101 115* 100  CO2 --   GLUCOSE 205* 156* 134* 121* 50* 75  BUN 23 26* 32* 32* 30* 37*  CREATININE 3.02* 3.02* 3.13* 3.29* 2.99* 4.20*  CALCIUM 7.9* 8.3* 8.1* 7.9* 5.8*  --   MG  --  2.0 1.8  --  1.4*  --   PHOS  --  3.3 1.9*  --  3.5  --     Liver Function Tests:  Recent Labs Lab 06/05/2014 1907 06/17/2014 2245 06/08/14 0400  AST 194* 257* 64*  ALT 190* 204* 85*  ALKPHOS 80 86 50  BILITOT 0.9 1.3* 0.5  PROT 6.6 6.6 4.0*  ALBUMIN 3.2* 3.3* 2.0*   No results for input(s): LIPASE, AMYLASE in the last 168 hours. No results for input(s): AMMONIA in the last 168 hours.  CBC:  Recent Labs Lab 06/14/2014 1907 06/23/2014 2245 06/07/14 0400 06/08/14 0400 06/08/14 0500 06/08/14 0524  WBC 16.8* 13.9* 15.8* 7.9 9.6  --   NEUTROABS 11.3* 12.5*  --   --   --   --   HGB  10.2* 9.8* 9.0* 6.7* 8.7* 8.2*  HCT 31.2* 29.1* 26.2* 20.0* 25.3* 24.0*  MCV 98.7 93.6 93.2 93.9 94.1  --   PLT 301 279 240 152 198  --     Cardiac Enzymes:  Recent  Labs Lab 08-11-2014 1907 08-11-2014 2245 06/07/14 0400 06/07/14 1036  TROPONINI 0.09* 0.38* 0.76* 0.92*    Lipid Panel:  Recent Labs Lab 06/07/14 0300  TRIG 29    CBG:  Recent Labs Lab 06/07/14 2334 06/08/14 0351 06/08/14 0724 06/08/14 0728 06/08/14 0751  GLUCAP 81 73 65* 66* 197*    Microbiology: Results for orders placed or performed during the hospital encounter of 08-11-2014  Culture, blood (routine x 2)     Status: None (Preliminary result)   Collection Time: 06/07/14 12:25 AM  Result Value Ref Range Status   Specimen Description BLOOD CENTRAL LINE  Final   Special Requests BOTTLES DRAWN AEROBIC AND ANAEROBIC 10CC EA  Final   Culture   Final           BLOOD CULTURE RECEIVED NO GROWTH TO DATE CULTURE WILL BE HELD FOR 5 DAYS BEFORE ISSUING A FINAL NEGATIVE REPORT [facility]    Report Status PENDING  Incomplete  Culture, blood (routine x 2)     Status: None (Preliminary result)   Collection Time: 06/07/14 12:27 AM  Result Value Ref Range Status   Specimen Description BLOOD RIGHT ARM  Final   Special Requests BOTTLES DRAWN AEROBIC ONLY 2CC  Final   Culture   Final           BLOOD CULTURE RECEIVED NO GROWTH TO DATE CULTURE WILL BE HELD FOR 5 DAYS BEFORE ISSUING A FINAL NEGATIVE REPORT Performed at [facility]    Report Status PENDING  Incomplete  MRSA PCR Screening     Status: None   Collection Time: 06/07/14  1:42 AM  Result Value Ref Range Status   MRSA by PCR NEGATIVE NEGATIVE Final    Comment:        The GeneXpert MRSA Assay (FDA approved for NASAL specimens only), is one component of a comprehensive MRSA colonization surveillance program. It is not intended to diagnose MRSA infection nor to guide or monitor treatment for MRSA infections.   Culture, respiratory (tracheal aspirate)     Status: None (Preliminary result)   Collection Time: 06/07/14  4:00 AM  Result Value Ref Range Status   Specimen Description TRACHEAL  ASPIRATE  Final   Special Requests NONE  Final   Gram Stain PENDING  Incomplete   Culture   Final    Culture reincubated for better growth Performed at [facility]    Report Status PENDING  Incomplete    Coagulation Studies:  Recent Labs  08-11-2014 2245 06/07/14 1036  LABPROT 15.1 15.3*  INR 1.18 1.20    Imaging: Dg Chest Port 1 View  06/07/2014   CLINICAL DATA:  Respiratory failure  EXAM: PORTABLE CHEST - 1 VIEW  COMPARISON:  02-24-2015  FINDINGS: Pacing pads overlie the chest. Endotracheal and nasogastric tubes are appropriately positioned. Mild enlargement of the cardiomediastinal silhouette is reidentified with central vascular congestion. Lungs are slightly less well aerated on the prior exam with crowding of the lung markings at the bases. Retrocardiac patchy airspace opacity is identified. Trace pleural fluid.  IMPRESSION: New patchy retrocardiac opacity which is nonspecific and could indicate atelectasis, contusion if there has been cardiopulmonary resuscitation, or pneumonia.   Electronically Signed   By: Vinnie LangtonGretchen  Green M.D.   On: 06/07/2014 15:36   Dg Chest Port 1 View  06/17/2014   CLINICAL DATA:  Cardiac arrest.  Assess endotracheal tube.  EXAM: PORTABLE CHEST - 1 VIEW  COMPARISON:  Earlier film, same date.  FINDINGS: The endotracheal tube is in good position, 6.5 cm above the carina. The NG tube is coursing down into the stomach. External pacer pad holes are noted. The lungs are slightly better aerated with less edema.  IMPRESSION: Endotracheal tube in good position, 6.5 cm above the carina.  Slight improved lung aeration with resolving pulmonary edema.   Electronically Signed   By: Loralie Champagne M.D.   On: 06/17/2014 23:29   Dg Chest Portable 1 View  06/23/2014   CLINICAL DATA:  Cardiac arrest.  EXAM: PORTABLE CHEST - 1 VIEW  COMPARISON:  04/11/2014.  FINDINGS: Nasogastric tube is followed into the stomach. Trachea is midline. Heart is enlarged. Thoracic aorta is  calcified. Mild diffuse interstitial prominence and indistinctness. No definite pleural fluid. No pneumothorax.  IMPRESSION: Mild edema.   Electronically Signed   By: Leanna Battles M.D.   On: 06/13/2014 19:51   Dg Chest Port 1v Same Day  06/02/2014   CLINICAL DATA:  Cardiac arrest, intubation.  EXAM: PORTABLE CHEST - 1 VIEW SAME DAY  COMPARISON:  06/28/2014.  FINDINGS: Endotracheal tube terminates approximately 1.5 cm above carina. Nasogastric tube terminates in the stomach. Heart is enlarged. Mild diffuse interstitial prominence and indistinctness, as before. Calcified granuloma in the lingula. No definite pleural fluid. Defibrillator pads project over the lower left hemi thorax.  IMPRESSION: 1. Endotracheal tube is low lying. Retracting approximately 2-3 cm would better position the tip above the carina. These results were called by telephone at the time of interpretation on 06/13/2014 at 8:12 pm to Dr. Mancel Bale , who verbally acknowledged these results. 2. Mild edema.   Electronically Signed   By: Leanna Battles M.D.   On: 06/26/2014 20:12   Ct Portable Head W/o Cm  06/27/2014   CLINICAL DATA:  Cardiac arrest.  EXAM: CT HEAD WITHOUT CONTRAST  TECHNIQUE: Contiguous axial images were obtained from the base of the skull through the vertex without intravenous contrast.  COMPARISON:  04/11/2014  FINDINGS: Stable age related cerebral atrophy, ventriculomegaly and periventricular white matter disease. No acute intracranial findings. The gray-white differentiation is maintained. No CT findings to suggest global anoxia.  IMPRESSION: Exam limited by motion artifact but no definite acute intracranial abnormality.  Chronic changes as discussed above.   Electronically Signed   By: Loralie Champagne M.D.   On: 06/22/2014 23:47    Assessment and plan per attending neurologist  Felicie Morn PA-C [facility] [phone]  06/08/2014, 10:24 AM   Assessment/Plan:  69 YO female hx of DM, HTN, ESRD on  HD, S/P cardiac arrest with prolonged ROSC (>40 minutes).  Currently in cooling protocol and noted to have facial twitching.  EEG confirms patient to be in status epilepticus.  1500 mg Depakote has been loaded, order for 500 mg TID has been placed and continuous EEG is being started.   Recommend: 1) Will hook up to LTM EEG 2) Depakote level in AM

## 2014-06-08 NOTE — Progress Notes (Signed)
240 ml of Fentanyl wasted in sink with Malachi ProKristina Tavian Callander, RN

## 2014-06-08 NOTE — Progress Notes (Signed)
PULMONARY / CRITICAL CARE MEDICINE   Name: [name] MRN: [id number] DOB: 03-29-1946    ADMISSION DATE:  06/13/2014  REFERRING MD :  [facility]  CHIEF COMPLAINT:  S/p cardiac arrest  INITIAL PRESENTATION:  69 yo female with asphyxiation on food with respiratory leading to cardiac arrest with about 40 minutes before ROSC.  STUDIES:  2/06 CT head >> cerebral atrophy, no acute findings 2/07 Echo >> EF 40 to 45%, mod LVH 2/07 EEG >> status epilepticus  SIGNIFICANT EVENTS: 2/06 admit, cardiology consult, hypothermia 2/08 neurology consult  SUBJECTIVE:  EEG reported to show status.  VITAL SIGNS: Temp:  [96.8 F (36 C)-97 F (36.1 C)] 96.8 F (36 C) (02/08 0800) Pulse Rate:  [53-70] 67 (02/08 0900) Resp:  [0-26] 26 (02/08 0900) BP: (104-191)/(43-59) 104/46 mmHg (02/08 0900) SpO2:  [100 %] 100 % (02/08 0900) Arterial Line BP: (108-187)/(43-64) 108/43 mmHg (02/08 0900) FiO2 (%):  [40 %] 40 % (02/08 0800) Weight:  [116 lb 2.9 oz (52.7 kg)] 116 lb 2.9 oz (52.7 kg) (02/08 0500) HEMODYNAMICS: CVP:  [7 mmHg] 7 mmHg VENTILATOR SETTINGS: Vent Mode:  [-] PRVC FiO2 (%):  [40 %] 40 % Set Rate:  [20 bmp] 20 bmp Vt Set:  [400 mL] 400 mL PEEP:  [5 cmH20] 5 cmH20 Plateau Pressure:  [20 cmH20-23 cmH20] 20 cmH20 INTAKE / OUTPUT:  Intake/Output Summary (Last 24 hours) at 06/08/14 1001 Last data filed at 06/08/14 0900  Gross per 24 hour  Intake 898.78 ml  Output     60 ml  Net 838.78 ml    PHYSICAL EXAMINATION: General: no distress Neuro: unresponsive HEENT: sluggish pupils, ETT in place  Cardiovascular: regular Lungs: scattered rhonchi, breaths over set vent rate Abdomen:  Soft, no guarding, 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 Musculoskeletal: fistula Lt arm Skin: no rashes  LABS:  CBC  Recent Labs Lab 06/07/14 0400 06/08/14 0400 06/08/14 0500 06/08/14 0524  WBC 15.8* 7.9 9.6  --   HGB 9.0* 6.7* 8.7* 8.2*  HCT 26.2* 20.0* 25.3* 24.0*  PLT 240 152  198  --    Coag's  Recent Labs Lab 06/28/2014 2245 06/07/14 1036  APTT 33  --   INR 1.18 1.20   BMET  Recent Labs Lab 06/07/14 0400 06/07/14 1130 06/08/14 0400 06/08/14 0524  NA 140 139 144 140  K 3.4* 3.8 2.9* 4.1  CL 101 101 115* 100  CO2 --   BUN 32* 32* 30* 37*  CREATININE 3.13* 3.29* 2.99* 4.20*  GLUCOSE 134* 121* 50* 75   Electrolytes  Recent Labs Lab 06/18/2014 2245 06/07/14 0400 06/07/14 1130 06/08/14 0400  CALCIUM 8.3* 8.1* 7.9* 5.8*  MG 2.0 1.8  --  1.4*  PHOS 3.3 1.9*  --  3.5   Sepsis Markers  Recent Labs Lab 06/13/2014 2300  LATICACIDVEN 1.7   ABG  Recent Labs Lab 06/17/2014 2250 06/07/14 0343 06/08/14 0349  PHART 7.462* 7.496* 7.461*  PCO2ART 46.6* 38.2 38.0  PO2ART 495.0* 72.0* 146.0*   Liver Enzymes  Recent Labs Lab 06/08/2014 1907 06/08/2014 2245 06/08/14 0400  AST 194* 257* 64*  ALT 190* 204* 85*  ALKPHOS 80 86 50  BILITOT 0.9 1.3* 0.5  ALBUMIN 3.2* 3.3* 2.0*   Cardiac Enzymes  Recent Labs Lab 06/14/2014 2245 06/07/14 0400 06/07/14 1036  TROPONINI 0.38* 0.76* 0.92*   Glucose  Recent Labs Lab 06/07/14 2100 06/07/14 2334 06/08/14 0351 06/08/14 0724 06/08/14 0728 06/08/14 0751  GLUCAP 78 81  73 65* 66* 197*    Imaging Dg Chest Port 1 View  06/07/2014   CLINICAL DATA:  Respiratory failure  EXAM: PORTABLE CHEST - 1 VIEW  COMPARISON:  06/28/2014  FINDINGS: Pacing pads overlie the chest. Endotracheal and nasogastric tubes are appropriately positioned. Mild enlargement of the cardiomediastinal silhouette is reidentified with central vascular congestion. Lungs are slightly less well aerated on the prior exam with crowding of the lung markings at the bases. Retrocardiac patchy airspace opacity is identified. Trace pleural fluid.  IMPRESSION: New patchy retrocardiac opacity which is nonspecific and could indicate atelectasis, contusion if there has been cardiopulmonary resuscitation, or pneumonia.   Electronically Signed    By: Christiana PellantGretchen  Green M.D.   On: 06/07/2014 15:36     ASSESSMENT / PLAN:  PULMONARY ETT 06/22/2014 A:  Acute hypoxic respiratory failure 2nd to asphyxiation of food. P:   Full vent support F/u CXR  CARDIOVASCULAR Lt femoral CVL 2/06 >> Lt femoral a line 2/06 >> A:  Respiratory leading to cardiac arrest. Hx of HTN, HLD. Non ischemic CM. Elevated troponin 2nd to demand ischemia. P:  Monitor hemodynamics Defer further cardiac assessment until neuro status better defined Hold outpt norvasc, coreg, catapres, imdur for now PRN hydralazine for SBP > 170  RENAL A:   ESRD >> HD T, Th, Sa as outpt. P:   Defer renal assessment, until neuro status better defined  GASTROINTESTINAL A:   Shock liver. Nutrition. Hx of GERD. P:   Protonix for SUP Tube feeds F/u LFT's  HEMATOLOGIC A:   Anemia of chronic disease.  P:  F/u CBC SQ heparin for DVT prevention   INFECTIOUS A:  No evidence for infection. P:   Monitor clinically off ABx  Blood 2/07 >> Sputum 2/07 >>  ENDOCRINE A:  DM type II with renal complications. P:   SSI  NEUROLOGIC A:   Acute encephalopathy after cardiac arrest. Status epilepticus noted on EEG 2/08. P:   Neurology consulted 2/08 Depakote per neurology PRN ativan Continue diprivan RASS goal -2 to -3 until seizures controlled  No family at bedside  CC time 35 minutes.  Coralyn HellingVineet Rilie Glanz, MD [facility] 06/08/2014, 10:14 AM Pager:  [phone] After 3pm call: [phone]

## 2014-06-08 NOTE — Progress Notes (Signed)
Progress Note  Subjective:    No overnight events. Eyes open this AM. CBG 60's, given Amp D50. Hypertensive today, off pressors. NSR in the 70's.   Objective:   Temp:  [96.8 F (36 C)-97 F (36.1 C)] 96.8 F (36 C) (02/08 0600) Pulse Rate:  [53-63] 60 (02/08 0700) Resp:  [0-20] 20 (02/08 0700) BP: (118-179)/(41-56) 170/53 mmHg (02/08 0700) SpO2:  [100 %] 100 % (02/08 0700) Arterial Line BP: (126-183)/(45-64) 172/59 mmHg (02/08 0700) FiO2 (%):  [40 %] 40 % (02/08 0340) Weight:  [116 lb 2.9 oz (52.7 kg)] 116 lb 2.9 oz (52.7 kg) (02/08 0500) Last BM Date:  (prior to assessment)  Filed Weights   Jul 01, 2014 2300 06/07/14 0500 06/08/14 0500  Weight: 111 lb 12.4 oz (50.7 kg) 107 lb 12.9 oz (48.9 kg) 116 lb 2.9 oz (52.7 kg)    Intake/Output Summary (Last 24 hours) at 06/08/14 0724 Last data filed at 06/08/14 0605  Gross per 24 hour  Intake 925.06 ml  Output     55 ml  Net 870.06 ml    Telemetry:  Physical Exam: General: Intubated, sedated. Eyes open. Unresponsive.  HEENT: Pupils equal, sluggish. Moist mucus membranes OETT/OGT in place.  Neck: Full range of motion without pain, supple, no lymphadenopathy or carotid bruits. Lungs: Air entry equal bilaterally. Scattered rhonchi. No wheezes.  Heart: RRR, no murmurs, gallops, or rubs. Abdomen: Soft, non-tender, non-distended, BS + Extremities: Thin, no edema. Faint distal pulses. Neurologic: Intubated, sedated. Decerebrate posturing?   Lab Results:  Basic Metabolic Panel:  Recent Labs Lab Jul 01, 2014 2245 06/07/14 0400 06/07/14 1130 06/08/14 0524  NA 142 140 139 140  K 4.6 3.4* 3.8 4.1  CL 102 101 101 100  CO2 30 30 27   --   GLUCOSE 156* 134* 121* 75  BUN 26* 32* 32* 37*  CREATININE 3.02* 3.13* 3.29* 4.20*  CALCIUM 8.3* 8.1* 7.9*  --   MG 2.0 1.8  --   --     Liver Function Tests:  Recent Labs Lab Jul 01, 2014 1907 Jul 01, 2014 2245  AST 194* 257*  ALT 190* 204*  ALKPHOS 80 86  BILITOT 0.9 1.3*  PROT 6.6 6.6    ALBUMIN 3.2* 3.3*    CBC:  Recent Labs Lab 06/07/14 0400 06/08/14 0400 06/08/14 0500 06/08/14 0524  WBC 15.8* 7.9 9.6  --   HGB 9.0* 6.7* 8.7* 8.2*  HCT 26.2* 20.0* 25.3* 24.0*  MCV 93.2 93.9 94.1  --   PLT 240 152 198  --     Cardiac Enzymes:  Recent Labs Lab Jul 01, 2014 2245 06/07/14 0400 06/07/14 1036  TROPONINI 0.38* 0.76* 0.92*    BNP:  Recent Labs  02/03/14 0649  PROBNP >70000.0*    Coagulation:  Recent Labs Lab Jul 01, 2014 2245 06/07/14 1036  INR 1.18 1.20    Radiology: Dg Chest Port 1 View  06/07/2014   CLINICAL DATA:  Respiratory failure  EXAM: PORTABLE CHEST - 1 VIEW  COMPARISON:  07/10/2014  FINDINGS: Pacing pads overlie the chest. Endotracheal and nasogastric tubes are appropriately positioned. Mild enlargement of the cardiomediastinal silhouette is reidentified with central vascular congestion. Lungs are slightly less well aerated on the prior exam with crowding of the lung markings at the bases. Retrocardiac patchy airspace opacity is identified. Trace pleural fluid.  IMPRESSION: New patchy retrocardiac opacity which is nonspecific and could indicate atelectasis, contusion if there has been cardiopulmonary resuscitation, or pneumonia.   Electronically Signed   By: Christiana PellantGretchen  Green M.D.   On:  06/07/2014 15:36   Dg Chest Port 1 View  01-Jul-2014   CLINICAL DATA:  Cardiac arrest.  Assess endotracheal tube.  EXAM: PORTABLE CHEST - 1 VIEW  COMPARISON:  Earlier film, same date.  FINDINGS: The endotracheal tube is in good position, 6.5 cm above the carina. The NG tube is coursing down into the stomach. External pacer pad holes are noted. The lungs are slightly better aerated with less edema.  IMPRESSION: Endotracheal tube in good position, 6.5 cm above the carina.  Slight improved lung aeration with resolving pulmonary edema.   Electronically Signed   By: Loralie Champagne M.D.   On: 07-01-2014 23:29   Dg Chest Portable 1 View  07-01-14   CLINICAL DATA:  Cardiac  arrest.  EXAM: PORTABLE CHEST - 1 VIEW  COMPARISON:  04/11/2014.  FINDINGS: Nasogastric tube is followed into the stomach. Trachea is midline. Heart is enlarged. Thoracic aorta is calcified. Mild diffuse interstitial prominence and indistinctness. No definite pleural fluid. No pneumothorax.  IMPRESSION: Mild edema.   Electronically Signed   By: Leanna Battles M.D.   On: July 01, 2014 19:51   Dg Chest Port 1v Same Day  07-01-2014   CLINICAL DATA:  Cardiac arrest, intubation.  EXAM: PORTABLE CHEST - 1 VIEW SAME DAY  COMPARISON:  2014-07-01.  FINDINGS: Endotracheal tube terminates approximately 1.5 cm above carina. Nasogastric tube terminates in the stomach. Heart is enlarged. Mild diffuse interstitial prominence and indistinctness, as before. Calcified granuloma in the lingula. No definite pleural fluid. Defibrillator pads project over the lower left hemi thorax.  IMPRESSION: 1. Endotracheal tube is low lying. Retracting approximately 2-3 cm would better position the tip above the carina. These results were called by telephone at the time of interpretation on Jul 01, 2014 at 8:12 pm to Dr. Mancel Bale , who verbally acknowledged these results. 2. Mild edema.   Electronically Signed   By: Leanna Battles M.D.   On: 07/01/2014 20:12   Ct Portable Head W/o Cm  2014-07-01   CLINICAL DATA:  Cardiac arrest.  EXAM: CT HEAD WITHOUT CONTRAST  TECHNIQUE: Contiguous axial images were obtained from the base of the skull through the vertex without intravenous contrast.  COMPARISON:  04/11/2014  FINDINGS: Stable age related cerebral atrophy, ventriculomegaly and periventricular white matter disease. No acute intracranial findings. The gray-white differentiation is maintained. No CT findings to suggest global anoxia.  IMPRESSION: Exam limited by motion artifact but no definite acute intracranial abnormality.  Chronic changes as discussed above.   Electronically Signed   By: Loralie Champagne M.D.   On: Jul 01, 2014 23:47    ECG:  Sinus brady w/ 1st degree AV block. LBBB.    Medications:   Scheduled Medications: . sodium chloride  2,000 mL Intravenous Once  . antiseptic oral rinse  7 mL Mouth Rinse QID  . chlorhexidine  15 mL Mouth Rinse BID  . famotidine (PEPCID) IV  20 mg Intravenous Q12H  . fentaNYL  50 mcg Intravenous Once  . heparin  5,000 Units Subcutaneous 3 times per day  . pantoprazole (PROTONIX) IV  40 mg Intravenous QHS  . sodium chloride  10-40 mL Intracatheter Q12H     Infusions: . sodium chloride 10 mL/hr at 06/07/14 1800  . sodium chloride 10 mL/hr at 06/07/14 1800  . epinephrine Stopped (Jul 01, 2014 2300)  . fentaNYL infusion INTRAVENOUS Stopped (06/07/14 0210)  . norepinephrine (LEVOPHED) Adult infusion Stopped (06/07/14 1852)  . propofol 50 mcg/kg/min (06/08/14 0500)     PRN Medications:  sodium chloride, acetaminophen, fentaNYL,  hydrALAZINE, midazolam, midazolam, ondansetron (ZOFRAN) IV, sodium chloride   Assessment and Plan:  69 y/o F w/ PMHx of ESRD, NICM, admitted on 2014-06-24 s/p cardiac arrest.   Cardiac Arrest: Occurred after patient choked on a piece of ham. Developed unshockable rhythm likely 2/2 hypoxia. Patient initially required transcutaneous pacing d/t complete heart block, now resolved. EKG from yesterday shows 1st degree AV block w/ LBBB. Hypertensive this AM, no longer requiring pressors.  -Continue normothermia protocol. Currently 36.1 C.  -Hydralazine prn for elevated BP  Non-Ischemic Cardiomyopathy: ECHO from 2012 showed EF of 20-25% and grade 2 diastolic dysfunction. Mild LVH, and diffuse global hypokinesis w/ apical akinesis. Repeat ECHO performed yesterday shows EF of 40-45% w/ elevated LV filling pressures, severe focal basal and moderate concentric hypertrophy. Also significant for akinesis of mid-inferoseptum and apical septal myocardium. Was on Coreg 12.5 mg bid, Norvasc 10 mg daily, Imdur 60 mg daily at home.  -Restart BB -ACEI?    VDRF: Management per PCCM.      Lauris Chroman, MD PGY-2 Internal Medicine Pager: [phone] \  Patient seen, examined. Available data reviewed. Agree with findings, assessment, and plan as outlined by Lauris Chroman, MD. Events noted and history carefully reviewed. 2D echo reviewed. Exam reveals intubated, sedated patient, unresponsive. Heart RRR, lungs CTA, no edema. Tele shows sinus rhythm. Pt has poor predictors regarding neuro outcome. She is currently being rewarmed and will have to assess mental status after rewarming but suspect very unlikely she will have meaningful neuro recovery.  Tonny Bollman, M.D. 06/08/2014 9:51 AM

## 2014-06-08 NOTE — Progress Notes (Signed)
Critical value received for hemoglobin of 6.7. Patient's abdomen noticed to be soft and distended with hypoactive bowel sounds. Dr. Brooks SailorsFeinstien made aware. STAT lab redraw ordered, resulting with hemoglobin of 8.2. No new orders at this time, will continue to monitor.

## 2014-06-08 NOTE — Progress Notes (Signed)
INITIAL NUTRITION ASSESSMENT  DOCUMENTATION CODES Per approved criteria  -Severe malnutrition in the context of chronic illness   INTERVENTION: Initiate Vital High Protein @ 10 ml/hr via OG tube   30 ml Prostat QID.    Tube feeding regimen provides 640 kcal, 81 grams of protein, and 200 ml of H2O.   TF regimen and propofol at current rate providing 1041 total kcal/day (96 % of kcal needs)  NUTRITION DIAGNOSIS: Inadequate oral intake related to inability to eat as evidenced by NPO status  Goal: Pt to meet >/= 90% of their estimated nutrition needs   Monitor:  TF tolerance, labs  Reason for Assessment: Consult received to initiate and manage enteral nutrition support.  69 y.o. female  Admitting Dx: Seizure  ASSESSMENT: Pt admitted after choking on her food at home and went into cardiac arrest with a prolonged resuscitation. Pt with hx of DM and ESRD on HD.   Patient is currently intubated on ventilator support MV: 7.9 L/min Temp (24hrs), Avg:96.9 F (36.1 C), Min:96.8 F (36 C), Max:97.7 F (36.5 C) Pt's current temp is 97.7 degrees F.   Propofol: 15.2 ml/hr provides: 401 kcal per day from lipid  Pt discussed during ICU rounds and with RN.  Per sister she feels that pt was eating well PTA with no recent weight loss. However, pt had signs of muscle depletion and had a wound that sister did not know about.   Nutrition Focused Physical Exam:  Subcutaneous Fat:  Orbital Region: WDL Upper Arm Region: severe depletion Thoracic and Lumbar Region: unable to assess  Muscle:  Temple Region: severe depletion Clavicle Bone Region: mild/moderate depletion Clavicle and Acromion Bone Region: severe depletion Scapular Bone Region: unable to assess Dorsal Hand: mild/moderate depletion  Patellar Region: unable to assess Anterior Thigh Region: unable to assess Posterior Calf Region: severe depletion  Edema: present in hands   Height: Ht Readings from Last 1 Encounters:   06/02/2014 5\' 1"  (1.549 m)    Weight: Wt Readings from Last 1 Encounters:  06/08/14 116 lb 2.9 oz (52.7 kg)    Ideal Body Weight: 47.7 kg   % Ideal Body Weight: 110%  Wt Readings from Last 10 Encounters:  06/08/14 116 lb 2.9 oz (52.7 kg)  04/11/14 115 lb (52.164 kg)  02/05/14 115 lb 8.3 oz (52.4 kg)  10/31/13 130 lb (58.968 kg)  08/02/13 126 lb 12.2 oz (57.499 kg)  01/31/11 170 lb (77.111 kg)  01/24/11 171 lb 15.3 oz (78 kg)    Usual Body Weight: 115 lb  % Usual Body Weight: 100%  BMI:  Body mass index is 21.96 kg/(m^2).  Estimated Nutritional Needs: Kcal: 1082 Protein: 70-85 grams Fluid: > 1.2 L/day  Skin: stage II on sacrum  Diet Order: Diet NPO time specified  EDUCATION NEEDS: -No education needs identified at this time   Intake/Output Summary (Last 24 hours) at 06/08/14 1157 Last data filed at 06/08/14 1110  Gross per 24 hour  Intake 970.18 ml  Output     60 ml  Net 910.18 ml    Last BM: PTA   Labs:   Recent Labs Lab 06/10/2014 2245 06/07/14 0400 06/07/14 1130 06/08/14 0400 06/08/14 0524  NA 142 140 139 144 140  K 4.6 3.4* 3.8 2.9* 4.1  CL 102 101 101 115* 100  CO2 30 30 27 22   --   BUN 26* 32* 32* 30* 37*  CREATININE 3.02* 3.13* 3.29* 2.99* 4.20*  CALCIUM 8.3* 8.1* 7.9* 5.8*  --  MG 2.0 1.8  --  1.4*  --   PHOS 3.3 1.9*  --  3.5  --   GLUCOSE 156* 134* 121* 50* 75    CBG (last 3)   Recent Labs  06/08/14 0724 06/08/14 0728 06/08/14 0751  GLUCAP 65* 66* 197*    Scheduled Meds: . antiseptic oral rinse  7 mL Mouth Rinse QID  . chlorhexidine  15 mL Mouth Rinse BID  . feeding supplement (VITAL HIGH PROTEIN)  1,000 mL Per Tube Q24H  . heparin  5,000 Units Subcutaneous 3 times per day  . insulin aspart  0-9 Units Subcutaneous 6 times per day  . pantoprazole sodium  40 mg Per Tube Q24H  . sodium chloride  10-40 mL Intracatheter Q12H  . valproate sodium  1,500 mg Intravenous Once  . valproate sodium  500 mg Intravenous TID     Continuous Infusions: . sodium chloride 10 mL/hr at 06/08/14 0700  . norepinephrine (LEVOPHED) Adult infusion    . propofol 50 mcg/kg/min (06/08/14 0830)    Past Medical History  Diagnosis Date  . Type 2 diabetes mellitus   . Hypercholesteremia   . Essential hypertension, benign   . UTI (urinary tract infection)   . Ruptured appendix   . Iron deficiency anemia   . Noncompliance   . Renal insufficiency     Dialysis Tues, Thurs, & Sat  . Dialysis patient     Tuesday, Thursday and Saturday   . GERD (gastroesophageal reflux disease)   . 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 Appendectomy  2008    Exploratory laparotomy  . Left carpal tunnel sugrey    . Stitch granuloma excision  2009  . Esophagogastroduodenoscopy  01/19/2011    Procedure: ESOPHAGOGASTRODUODENOSCOPY (EGD);  Surgeon: Malissa Hippo, MD;  Location: [facility];  Service: Endoscopy;  Laterality: N/A;  . Abdominal hysterectomy    . Tonsillectomy    . Cataract extraction w/phaco  07/10/2011    Procedure: CATARACT EXTRACTION PHACO AND INTRAOCULAR LENS PLACEMENT (IOC);  Surgeon: Gemma Payor, MD;  Location: [facility];  Service: Ophthalmology;  Laterality: Left;  CDE: 20.36  . Cataract extraction w/phaco  08/07/2011    Procedure: CATARACT EXTRACTION PHACO AND INTRAOCULAR LENS PLACEMENT (IOC);  Surgeon: Gemma Payor, MD;  Location: [facility];  Service: Ophthalmology;  Laterality: Right;  CDE:15.31    Kendell Bane RD, LDN, CNSC [phone] Pager [phone] After Hours Pager

## 2014-06-08 NOTE — Procedures (Addendum)
History: 69 yo F with prolonged cardiac arrest.   Sedation: Propofol  Technique: This is a 17 channel routine scalp EEG performed at the bedside with bipolar and monopolar montages arranged in accordance to the international 10/20 system of electrode placement. One channel was dedicated to EKG recording.    Background: The background is asymmetric with the left much attenuated compared to the right. Bilaterally, there are 1 - 2 Hz epileptiform discharges with associated fast activity(GPEDs-plus). Though much more prominent over the right hemisphere, these are seen bilaterally. There are periods of evolution with an increase in frequency to 3.5 - 4 Hz that last for 2 - 3 seconds at a time.   Photic stimulation: Physiologic driving is not performed  EEG Abnormalities: 1) Periodic discharges with frequency of 1 - 2 Hz associated with fast activity. Though seen much more prominently over the right hemisphere, these are consistently present bilaterally. 2) Frequent periods of evolution to 3 - 4 Hz spike and wave discharges.  Clinical Interpretation: This EEG is consistent with  status epilepticus. Possible explanations for the asymmetry would include focal status epilepticus involving the right hemisphere with some spread to the left vs a generalized status epilepticus with left cortical injury > right  causing attenuation on the left side.   Findings were discussed with primary team at the time of interpretation.    Ritta SlotMcNeill Hollyanne Schloesser, MD [facility] [phone]  If 7pm- 7am, please page neurology on call as listed in AMION.

## 2014-06-08 NOTE — Progress Notes (Signed)
EEG completed; results pending.    

## 2014-06-08 NOTE — Progress Notes (Signed)
LTM EEG started per Dr Kirkpatrick. 

## 2014-06-09 ENCOUNTER — Inpatient Hospital Stay (HOSPITAL_COMMUNITY): Payer: Medicare Other

## 2014-06-09 DIAGNOSIS — J690 Pneumonitis due to inhalation of food and vomit: Secondary | ICD-10-CM

## 2014-06-09 LAB — GLUCOSE, CAPILLARY
GLUCOSE-CAPILLARY: 72 mg/dL (ref 70–99)
GLUCOSE-CAPILLARY: 77 mg/dL (ref 70–99)
Glucose-Capillary: 78 mg/dL (ref 70–99)
Glucose-Capillary: 77 mg/dL (ref 70–99)
Glucose-Capillary: 68 mg/dL — ABNORMAL LOW (ref 70–99)
Glucose-Capillary: 187 mg/dL — ABNORMAL HIGH (ref 70–99)
Glucose-Capillary: 84 mg/dL (ref 70–99)

## 2014-06-09 LAB — POCT I-STAT 3, ART BLOOD GAS (G3+)
Bicarbonate: 24.9 mEq/L — ABNORMAL HIGH (ref 20.0–24.0)
O2 SAT: 99 %
PCO2 ART: 38.9 mmHg (ref 35.0–45.0)
PO2 ART: 142 mmHg — AB (ref 80.0–100.0)
Patient temperature: 37
TCO2: 26 mmol/L (ref 0–100)
pH, Arterial: 7.414 (ref 7.350–7.450)

## 2014-06-09 LAB — CBC
HCT: 24.3 % — ABNORMAL LOW (ref 36.0–46.0)
HEMOGLOBIN: 8 g/dL — AB (ref 12.0–15.0)
MCH: 31.3 pg (ref 26.0–34.0)
MCHC: 32.9 g/dL (ref 30.0–36.0)
MCV: 94.9 fL (ref 78.0–100.0)
Platelets: 251 10*3/uL (ref 150–400)
RBC: 2.56 MIL/uL — ABNORMAL LOW (ref 3.87–5.11)
RDW: 14.6 % (ref 11.5–15.5)
WBC: 9.1 10*3/uL (ref 4.0–10.5)

## 2014-06-09 LAB — CULTURE, RESPIRATORY

## 2014-06-09 LAB — RENAL FUNCTION PANEL
ANION GAP: 15 (ref 5–15)
Albumin: 2.5 g/dL — ABNORMAL LOW (ref 3.5–5.2)
BUN: 66 mg/dL — AB (ref 6–23)
CHLORIDE: 99 mmol/L (ref 96–112)
CO2: 24 mmol/L (ref 19–32)
CREATININE: 4.61 mg/dL — AB (ref 0.50–1.10)
Calcium: 7.3 mg/dL — ABNORMAL LOW (ref 8.4–10.5)
GFR calc Af Amer: 10 mL/min — ABNORMAL LOW (ref >90)
GFR calc non Af Amer: 9 mL/min — ABNORMAL LOW (ref >90)
GLUCOSE: 73 mg/dL (ref 70–99)
POTASSIUM: 4.4 mmol/L (ref 3.5–5.1)
Phosphorus: 5.8 mg/dL — ABNORMAL HIGH (ref 2.3–4.6)
Sodium: 138 mmol/L (ref 135–145)

## 2014-06-09 LAB — URINE CULTURE
CULTURE: NO GROWTH
Colony Count: NO GROWTH

## 2014-06-09 LAB — MAGNESIUM: Magnesium: 1.9 mg/dL (ref 1.5–2.5)

## 2014-06-09 LAB — VALPROIC ACID LEVEL: Valproic Acid Lvl: 52.9 ug/mL (ref 50.0–100.0)

## 2014-06-09 LAB — CULTURE, RESPIRATORY W GRAM STAIN

## 2014-06-09 MED ORDER — LEVETIRACETAM IN NACL 1000 MG/100ML IV SOLN
1000.0000 mg | INTRAVENOUS | Status: DC
  Administered 2014-06-09 – 2014-06-10 (×2): 1000 mg via INTRAVENOUS
  Filled 2014-06-09 (×3): qty 100

## 2014-06-09 MED ORDER — SODIUM CHLORIDE 0.9 % IV SOLN
1.5000 g | INTRAVENOUS | Status: DC
  Filled 2014-06-09: qty 1.5

## 2014-06-09 MED ORDER — NOREPINEPHRINE BITARTRATE 1 MG/ML IV SOLN
2.0000 ug/min | INTRAVENOUS | Status: DC
  Administered 2014-06-09 (×2): 2 ug/min via INTRAVENOUS
  Filled 2014-06-09: qty 4

## 2014-06-09 MED ORDER — DEXTROSE 50 % IV SOLN
INTRAVENOUS | Status: AC
  Administered 2014-06-09: 50 mL via INTRAVENOUS
  Filled 2014-06-09: qty 50

## 2014-06-09 MED ORDER — DARBEPOETIN ALFA 40 MCG/0.4ML IJ SOSY
40.0000 ug | PREFILLED_SYRINGE | INTRAMUSCULAR | Status: DC
  Filled 2014-06-09: qty 0.4

## 2014-06-09 MED ORDER — SODIUM CHLORIDE 0.9 % IV SOLN
1.5000 g | INTRAVENOUS | Status: DC
  Administered 2014-06-10 – 2014-06-11 (×2): 1.5 g via INTRAVENOUS
  Filled 2014-06-09 (×2): qty 1.5

## 2014-06-09 MED ORDER — SODIUM CHLORIDE 0.9 % IV SOLN
1.5000 g | Freq: Once | INTRAVENOUS | Status: AC
  Administered 2014-06-09: 1.5 g via INTRAVENOUS
  Filled 2014-06-09: qty 1.5

## 2014-06-09 MED ORDER — DEXTROSE 50 % IV SOLN
Freq: Once | INTRAVENOUS | Status: AC
  Administered 2014-06-09: 50 mL via INTRAVENOUS

## 2014-06-09 NOTE — Progress Notes (Addendum)
Subjective: No overnight events. Routine EEG showed seizure activity, LTM hooked up, patient started on VPA and keppra. 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   VPA level 52.9  Objective: Current vital signs: BP 115/45 mmHg  Pulse 53  Temp(Src) 98.8 F (37.1 C) (Core (Comment))  Resp 20  Ht 5\' 1"  (1.549 m)  Wt 49.7 kg (109 lb 9.1 oz)  BMI 20.71 kg/m2  SpO2 100% Vital signs in last 24 hours: Temp:  [96.8 F (36 C)-98.8 F (37.1 C)] 98.8 F (37.1 C) (02/09 0600) Pulse Rate:  [49-70] 53 (02/09 0700) Resp:  [0-26] 20 (02/09 0700) BP: (91-191)/(39-59) 115/45 mmHg (02/09 0700) SpO2:  [98 %-100 %] 100 % (02/09 0700) Arterial Line BP: (99-187)/(33-65) 123/36 mmHg (02/09 0700) FiO2 (%):  [40 %] 40 % (02/09 0311) Weight:  [49.7 kg (109 lb 9.1 oz)] 49.7 kg (109 lb 9.1 oz) (02/09 0400)  Intake/Output from previous day: 02/08 0701 - 02/09 0700 In: 1679.5 [I.V.:879.5; NG/GT:400; IV Piggyback:400] Out: 45 [Urine:45] Intake/Output this shift:   Nutritional status: Diet NPO time specified  Neurologic Exam: Mental Status: Patient does not respond to verbal stimuli. Does not respond to deep sternal rub. Does not follow commands. No verbalizations noted.  Cranial Nerves: II: patient does not respond confrontation bilaterally, pupils right 2 mm, left 2 mm,and non-reactive bilaterally III,IV,VI: doll's response absent bilaterally.  V,VII: corneal reflex absent bilaterally  VIII: patient does not respond to verbal stimuli IX,X: gag reflex absent, XI: trapezius strength unable to test bilaterally XII: tongue strength unable to test Motor: Extremities flaccid throughout. No spontaneous movement noted. No purposeful movements noted. Sensory: Does not respond to noxious stimuli in in UE with triple reflex in bilateral LE. Deep Tendon Reflexes:  2+ throughout Plantars: upgoing bilaterally Cerebellar: Unable to perform  Lab Results: Basic Metabolic Panel:  Recent  Labs Lab 06/07/2014 2245 06/07/14 0400 06/07/14 1130 06/08/14 0400 06/08/14 0524 06/09/14 0330  NA 142 140 139 144 140 138  K 4.6 3.4* 3.8 2.9* 4.1 4.4  CL 102 101 101 115* 100 99  CO2 30 30 27 22   --  24  GLUCOSE 156* 134* 121* 50* 75 73  BUN 26* 32* 32* 30* 37* 66*  CREATININE 3.02* 3.13* 3.29* 2.99* 4.20* 4.61*  CALCIUM 8.3* 8.1* 7.9* 5.8*  --  7.3*  MG 2.0 1.8  --  1.4*  --  1.9  PHOS 3.3 1.9*  --  3.5  --  5.8*    Liver Function Tests:  Recent Labs Lab 06/21/2014 1907 06/21/2014 2245 06/08/14 0400 06/09/14 0330  AST 194* 257* 64*  --   ALT 190* 204* 85*  --   ALKPHOS 80 86 50  --   BILITOT 0.9 1.3* 0.5  --   PROT 6.6 6.6 4.0*  --   ALBUMIN 3.2* 3.3* 2.0* 2.5*   No results for input(s): LIPASE, AMYLASE in the last 168 hours. No results for input(s): AMMONIA in the last 168 hours.  CBC:  Recent Labs Lab 06/04/2014 1907 06/03/2014 2245 06/07/14 0400 06/08/14 0400 06/08/14 0500 06/08/14 0524 06/09/14 0330  WBC 16.8* 13.9* 15.8* 7.9 9.6  --  9.1  NEUTROABS 11.3* 12.5*  --   --   --   --   --   HGB 10.2* 9.8* 9.0* 6.7* 8.7* 8.2* 8.0*  HCT 31.2* 29.1* 26.2* 20.0* 25.3* 24.0* 24.3*  MCV 98.7 93.6 93.2 93.9 94.1  --  94.9  PLT 301 279 240 152 198  --  251    Cardiac Enzymes:  Recent Labs Lab Jun 07, 2014 1907 June 07, 2014 2245 06/07/14 0400 06/07/14 1036  TROPONINI 0.09* 0.38* 0.76* 0.92*    Lipid Panel:  Recent Labs Lab 06/07/14 0300  TRIG 29    CBG:  Recent Labs Lab 06/07/14 2334 06/08/14 0351 06/08/14 0724 06/08/14 0728 06/08/14 0751  GLUCAP 81 73 65* 66* 197*    Microbiology: Results for orders placed or performed during the hospital encounter of 06-07-2014  Culture, blood (routine x 2)     Status: None (Preliminary result)   Collection Time: 06/07/14 12:25 AM  Result Value Ref Range Status   Specimen Description BLOOD CENTRAL LINE  Final   Special Requests BOTTLES DRAWN AEROBIC AND ANAEROBIC 10CC EA  Final   Culture   Final           BLOOD  CULTURE RECEIVED NO GROWTH TO DATE CULTURE WILL BE HELD FOR 5 DAYS BEFORE ISSUING A FINAL NEGATIVE REPORT Note: Culture results may be compromised due to an excessive volume of blood received in culture bottles. Performed at [facility]    Report Status PENDING  Incomplete  Culture, blood (routine x 2)     Status: None (Preliminary result)   Collection Time: 06/07/14 12:27 AM  Result Value Ref Range Status   Specimen Description BLOOD RIGHT ARM  Final   Special Requests BOTTLES DRAWN AEROBIC ONLY 2CC  Final   Culture   Final           BLOOD CULTURE RECEIVED NO GROWTH TO DATE CULTURE WILL BE HELD FOR 5 DAYS BEFORE ISSUING A FINAL NEGATIVE REPORT Performed at [facility]    Report Status PENDING  Incomplete  MRSA PCR Screening     Status: None   Collection Time: 06/07/14  1:42 AM  Result Value Ref Range Status   MRSA by PCR NEGATIVE NEGATIVE Final    Comment:        The GeneXpert MRSA Assay (FDA approved for NASAL specimens only), is one component of a comprehensive MRSA colonization surveillance program. It is not intended to diagnose MRSA infection nor to guide or monitor treatment for MRSA infections.   Culture, respiratory (tracheal aspirate)     Status: None (Preliminary result)   Collection Time: 06/07/14  4:00 AM  Result Value Ref Range Status   Specimen Description TRACHEAL ASPIRATE  Final   Special Requests NONE  Final   Gram Stain   Final    RARE WBC PRESENT, PREDOMINANTLY PMN NO SQUAMOUS EPITHELIAL CELLS SEEN FEW GRAM POSITIVE COCCI IN PAIRS IN CHAINS Performed at [facility]    Culture   Final    Culture reincubated for better growth Performed at [facility]    Report Status PENDING  Incomplete  Urine culture     Status: None   Collection Time: 06/07/14 10:29 AM  Result Value Ref Range Status   Specimen Description URINE, CATHETERIZED  Final   Special Requests NONE  Final   Colony Count NO GROWTH Performed at [facility]   Final   Culture NO GROWTH Performed at [facility]   Final   Report Status 06/09/2014 FINAL  Final    Coagulation Studies:  Recent Labs  07-Jun-2014 2245 06/07/14 1036  LABPROT 15.1 15.3*  INR 1.18 1.20    Imaging: Dg Chest Port 1 View  06/09/2014   CLINICAL DATA:  Respiratory failure.  Shortness of breath.  EXAM: PORTABLE CHEST - 1 VIEW  COMPARISON:  06/07/2014.  FINDINGS: Endotracheal tube and NG tube in stable position. Cardiomegaly with pulmonary vascular prominence, diffuse pulmonary interstitial, and bibasilar infiltrates prominence noted on today's exam. Findings suggest congestive heart failure. Bibasilar pneumonia cannot be excluded. Small right pleural effusion cannot be excluded. No pneumothorax. Left subclavian vascular stent noted.  IMPRESSION: 1. Lines and tubes in stable position. 2. Changes of congestive heart failure with pulmonary edema and small right pleural effusion noted on today's exam. Bibasilar pneumonia cannot be excluded. 3. Left subclavian vascular stent in stable position.   Electronically Signed   By: Maisie Fus  Register   On: 06/09/2014 07:18   Dg Chest Port 1 View  06/07/2014   CLINICAL DATA:  Respiratory failure  EXAM: PORTABLE CHEST - 1 VIEW  COMPARISON:  06/21/2014  FINDINGS: Pacing pads overlie the chest. Endotracheal and nasogastric tubes are appropriately positioned. Mild enlargement of the cardiomediastinal silhouette is reidentified with central vascular congestion. Lungs are slightly less well aerated on the prior exam with crowding of the lung markings at the bases. Retrocardiac patchy airspace opacity is identified. Trace pleural fluid.  IMPRESSION: New patchy retrocardiac opacity which is nonspecific and could indicate atelectasis, contusion if there has been cardiopulmonary resuscitation, or pneumonia.   Electronically Signed   By: Christiana Pellant M.D.   On: 06/07/2014 15:36    Medications:  Scheduled: . antiseptic oral rinse  7  mL Mouth Rinse QID  . chlorhexidine  15 mL Mouth Rinse BID  . feeding supplement (PRO-STAT SUGAR FREE 64)  30 mL Per Tube QID  . feeding supplement (VITAL HIGH PROTEIN)  1,000 mL Per Tube Q24H  . heparin  5,000 Units Subcutaneous 3 times per day  . insulin aspart  0-9 Units Subcutaneous 6 times per day  . levETIRAcetam  1,000 mg Intravenous Q12H  . pantoprazole sodium  40 mg Per Tube Q24H  . sodium chloride  10-40 mL Intracatheter Q12H  . valproate sodium  500 mg Intravenous TID    Assessment/Plan:  69 YO female hx of DM, HTN, ESRD on HD, S/P cardiac arrest with prolonged ROSC (>40 minutes). Currently in cooling protocol and noted to have facial twitching. EEG confirms patient to be in status epilepticus. SE resolved with VPA and keppra but EEG continues to show likely GPEDs (official LTM read pending). Continue VPA and keppra at this time. Will evaluate off sedation but unfortunately guarded prognosis for meaningful neurological recovery based on clinical history and exam.    LOS: 3 days   Elspeth Cho, DO [facility] [phone]  If 7pm- 7am, please page neurology on call as listed in AMION. 06/09/2014  7:36 AM

## 2014-06-09 NOTE — Progress Notes (Signed)
ANTIBIOTIC CONSULT NOTE - INITIAL  Pharmacy Consult for unasyn Indication: aspiration PNA  No Known Allergies  Patient Measurements: Height:  (154.9 cm) Weight: 109 lb 9.1 oz (49.7 kg) IBW/kg (Calculated) : 47.8  Vital Signs: Temp: 98.6 F (37 C) (02/09 0800) Temp Source: Core (Comment) (02/09 0800) BP: 173/41 mmHg (02/09 0800) Pulse Rate: 55 (02/09 0900) Intake/Output from previous day: 02/08 0701 - 02/09 0700 In: 1739.7 [I.V.:929.7; NG/GT:410; IV Piggyback:400] Out: 45 [Urine:45] Intake/Output from this shift: Total I/O In: 95.6 [I.V.:75.6; NG/GT:20] Out: -   Labs:  Recent Labs  06/08/14 0400 06/08/14 0500 06/08/14 0524 06/09/14 0330  WBC 7.9 9.6  --  9.1  HGB 6.7* 8.7* 8.2* 8.0*  PLT 152 198  --  251  CREATININE 2.99*  --  4.20* 4.61*   Estimated Creatinine Clearance: 8.7 mL/min (by C-G formula based on Cr of 4.61). No results for input(s): VANCOTROUGH, VANCOPEAK, VANCORANDOM, GENTTROUGH, GENTPEAK, GENTRANDOM, TOBRATROUGH, TOBRAPEAK, TOBRARND, AMIKACINPEAK, AMIKACINTROU, AMIKACIN in the last 72 hours.   Microbiology: Recent Results (from the past 720 hour(s))  Culture, blood (routine x 2)     Status: None (Preliminary result)   Collection Time: 06/07/14 12:25 AM  Result Value Ref Range Status   Specimen Description BLOOD CENTRAL LINE  Final   Special Requests BOTTLES DRAWN AEROBIC AND ANAEROBIC 10CC EA  Final   Culture   Final           BLOOD CULTURE RECEIVED NO GROWTH TO DATE CULTURE WILL BE HELD FOR 5 DAYS BEFORE ISSUING A FINAL NEGATIVE REPORT Note: Culture results may be compromised due to an excessive volume of blood received in culture bottles. Performed at [facility]    Report Status PENDING  Incomplete  Culture, blood (routine x 2)     Status: None (Preliminary result)   Collection Time: 06/07/14 12:27 AM  Result Value Ref Range Status   Specimen Description BLOOD RIGHT ARM  Final   Special Requests BOTTLES DRAWN AEROBIC ONLY 2CC   Final   Culture   Final           BLOOD CULTURE RECEIVED NO GROWTH TO DATE CULTURE WILL BE HELD FOR 5 DAYS BEFORE ISSUING A FINAL NEGATIVE REPORT Performed at [facility]    Report Status PENDING  Incomplete  MRSA PCR Screening     Status: None   Collection Time: 06/07/14  1:42 AM  Result Value Ref Range Status   MRSA by PCR NEGATIVE NEGATIVE Final    Comment:        The GeneXpert MRSA Assay (FDA approved for NASAL specimens only), is one component of a comprehensive MRSA colonization surveillance program. It is not intended to diagnose MRSA infection nor to guide or monitor treatment for MRSA infections.   Culture, respiratory (tracheal aspirate)     Status: None   Collection Time: 06/07/14  4:00 AM  Result Value Ref Range Status   Specimen Description TRACHEAL ASPIRATE  Final   Special Requests NONE  Final   Gram Stain   Final    RARE WBC PRESENT, PREDOMINANTLY PMN NO SQUAMOUS EPITHELIAL CELLS SEEN FEW GRAM POSITIVE COCCI IN PAIRS IN CHAINS Performed at [facility]    Culture   Final    Non-Pathogenic Oropharyngeal-type Flora Isolated. Performed at [facility]    Report Status 06/09/2014 FINAL  Final  Urine culture     Status: None   Collection Time: 06/07/14 10:29 AM  Result Value Ref Range Status  Specimen Description URINE, CATHETERIZED  Final   Special Requests NONE  Final   Colony Count NO GROWTH Performed at [facility]   Final   Culture NO GROWTH [facility]formed at [facility]   Final   Report Status 06/09/2014 FINAL  Final    Medical History: Past Medical History  Diagnosis Date  . Type 2 diabetes mellitus   . Hypercholesteremia   . Essential hypertension, benign   . UTI (urinary tract infection)   . Ruptured appendix   . Iron deficiency anemia   . Noncompliance   . Renal insufficiency     Dialysis Tues, Thurs, & Sat  . Dialysis patient     Tuesday, Thursday and Saturday   . GERD (gastroesophageal  reflux disease)   . 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 ampicillin-sulbactam (UNASYN) IV  1.5 g Intravenous Q24H  . antiseptic oral rinse  7 mL Mouth Rinse QID  . chlorhexidine  15 mL Mouth Rinse BID  . feeding supplement (PRO-STAT SUGAR FREE 64)  30 mL Per Tube QID  . feeding supplement (VITAL HIGH PROTEIN)  1,000 mL Per Tube Q24H  . heparin  5,000 Units Subcutaneous 3 times per day  . insulin aspart  0-9 Units Subcutaneous 6 times per day  . levETIRAcetam  1,000 mg Intravenous Q12H  . pantoprazole sodium  40 mg Per Tube Q24H  . sodium chloride  10-40 mL Intracatheter Q12H  . valproate sodium  500 mg Intravenous TID   Assessment: 69 yo female s/p cardiac arrest and with VDRF to begin unasyn for possible aspiration PNA. WBC= 9.1, SCr= 4.61, CrCl ~ 10 (noted with ESRD on HD as outpatient).  2/9 unasyn>>  2/7 trach- normal flora 2/7 urine- neg 2/7 blood - ngtd  Plan:  -Unasyn 1.5gm IV q24hr -Will follow renal function, cultures and clinical progress  Harland Germanndrew Katlen Seyer, Pharm D 06/09/2014 10:13 AM

## 2014-06-09 NOTE — Progress Notes (Signed)
PULMONARY / CRITICAL CARE MEDICINE   Name: [name] MRN: [id number] DOB: 1946-01-12    ADMISSION DATE:  06/20/2014  REFERRING MD :  [facility]  CHIEF COMPLAINT:  S/p cardiac arrest  INITIAL PRESENTATION:  69 yo female with asphyxiation on food with respiratory leading to cardiac arrest with about 40 minutes before ROSC.  STUDIES:  2/06 CT head >> cerebral atrophy, no acute findings 2/07 Echo >> EF 40 to 45%, mod LVH 2/07 EEG >> status epilepticus  SIGNIFICANT EVENTS: 2/06 admit, cardiology consult, hypothermia 2/08 neurology consult 2/09 nephrology consulted  SUBJECTIVE:  Sedation turned off this AM.  VITAL SIGNS: Temp:  [96.8 F (36 C)-98.8 F (37.1 C)] 98.6 F (37 C) (02/09 0800) Pulse Rate:  [49-67] 54 (02/09 0800) Resp:  [0-26] 20 (02/09 0800) BP: (91-176)/(39-51) 173/41 mmHg (02/09 0800) SpO2:  [98 %-100 %] 100 % (02/09 0800) Arterial Line BP: (99-170)/(33-65) 169/46 mmHg (02/09 0800) FiO2 (%):  [40 %] 40 % (02/09 0750) Weight:  [109 lb 9.1 oz (49.7 kg)] 109 lb 9.1 oz (49.7 kg) (02/09 0400) HEMODYNAMICS: CVP:  [9 mmHg] 9 mmHg VENTILATOR SETTINGS: Vent Mode:  [-] PRVC FiO2 (%):  [40 %] 40 % Set Rate:  [20 bmp] 20 bmp Vt Set:  [400 mL] 400 mL PEEP:  [5 cmH20] 5 cmH20 Plateau Pressure:  [20 cmH20-23 cmH20] 22 cmH20 INTAKE / OUTPUT:  Intake/Output Summary (Last 24 hours) at 06/09/14 0858 Last data filed at 06/09/14 0800  Gross per 24 hour  Intake 1804.66 ml  Output     40 ml  Net 1764.66 ml    PHYSICAL EXAMINATION: General: no distress Neuro: unresponsive HEENT: ETT in place  Cardiovascular: regular Lungs: scattered rhonchi Abdomen:  Soft, no guarding, 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 Musculoskeletal: fistula Lt arm Skin: no rashes  LABS:  CBC  Recent Labs Lab 06/08/14 0400 06/08/14 0500 06/08/14 0524 06/09/14 0330  WBC 7.9 9.6  --  9.1  HGB 6.7* 8.7* 8.2* 8.0*  HCT 20.0* 25.3* 24.0* 24.3*  PLT 152 198  --  251    Coag's  Recent Labs Lab 06/03/2014 2245 06/07/14 1036  APTT 33  --   INR 1.18 1.20   BMET  Recent Labs Lab 06/07/14 1130 06/08/14 0400 06/08/14 0524 06/09/14 0330  NA 139 144 140 138  K 3.8 2.9* 4.1 4.4  CL 101 115* 100 99  CO2 27 22  --  24  BUN 32* 30* 37* 66*  CREATININE 3.29* 2.99* 4.20* 4.61*  GLUCOSE 121* 50* 75 73   Electrolytes  Recent Labs Lab 06/07/14 0400 06/07/14 1130 06/08/14 0400 06/09/14 0330  CALCIUM 8.1* 7.9* 5.8* 7.3*  MG 1.8  --  1.4* 1.9  PHOS 1.9*  --  3.5 5.8*   Sepsis Markers  Recent Labs Lab 06/22/2014 2300  LATICACIDVEN 1.7   ABG  Recent Labs Lab 06/26/2014 2250 06/07/14 0343 06/08/14 0349  PHART 7.462* 7.496* 7.461*  PCO2ART 46.6* 38.2 38.0  PO2ART 495.0* 72.0* 146.0*   Liver Enzymes  Recent Labs Lab 06/13/2014 1907 06/13/2014 2245 06/08/14 0400 06/09/14 0330  AST 194* 257* 64*  --   ALT 190* 204* 85*  --   ALKPHOS 80 86 50  --   BILITOT 0.9 1.3* 0.5  --   ALBUMIN 3.2* 3.3* 2.0* 2.5*   Cardiac Enzymes  Recent Labs Lab 06/03/2014 2245 06/07/14 0400 06/07/14 1036  TROPONINI 0.38* 0.76* 0.92*   Glucose  Recent Labs Lab 06/07/14 2100 06/07/14 2334  06/08/14 0351 06/08/14 0724 06/08/14 0728 06/08/14 0751  GLUCAP 78 81 73 65* 66* 197*    Imaging Dg Chest Port 1 View  06/09/2014   CLINICAL DATA:  Respiratory failure.  Shortness of breath.  EXAM: PORTABLE CHEST - 1 VIEW  COMPARISON:  06/07/2014.  FINDINGS: Endotracheal tube and NG tube in stable position. Cardiomegaly with pulmonary vascular prominence, diffuse pulmonary interstitial, and bibasilar infiltrates prominence noted on today's exam. Findings suggest congestive heart failure. Bibasilar pneumonia cannot be excluded. Small right pleural effusion cannot be excluded. No pneumothorax. Left subclavian vascular stent noted.  IMPRESSION: 1. Lines and tubes in stable position. 2. Changes of congestive heart failure with pulmonary edema and small right pleural  effusion noted on today's exam. Bibasilar pneumonia cannot be excluded. 3. Left subclavian vascular stent in stable position.   Electronically Signed   By: Maisie Fushomas  Register   On: 06/09/2014 07:18   Dg Chest Port 1 View  06/07/2014   CLINICAL DATA:  Respiratory failure  EXAM: PORTABLE CHEST - 1 VIEW  COMPARISON:  06/13/2014  FINDINGS: Pacing pads overlie the chest. Endotracheal and nasogastric tubes are appropriately positioned. Mild enlargement of the cardiomediastinal silhouette is reidentified with central vascular congestion. Lungs are slightly less well aerated on the prior exam with crowding of the lung markings at the bases. Retrocardiac patchy airspace opacity is identified. Trace pleural fluid.  IMPRESSION: New patchy retrocardiac opacity which is nonspecific and could indicate atelectasis, contusion if there has been cardiopulmonary resuscitation, or pneumonia.   Electronically Signed   By: Christiana PellantGretchen  Green M.D.   On: 06/07/2014 15:36      ASSESSMENT / PLAN:  PULMONARY ETT 06/14/2014 A:  Acute hypoxic respiratory failure 2nd to asphyxiation of food. P:   Full vent support F/u CXR, ABG  CARDIOVASCULAR Lt femoral CVL 2/06 >> Lt femoral a line 2/06 >> A:  Respiratory leading to cardiac arrest. Hx of HTN, HLD. Non ischemic CM. Elevated troponin 2nd to demand ischemia. P:  Monitor hemodynamics Defer further cardiac assessment until neuro status better defined Wean off levophed for goal MAP > 65 Hold outpt coreg, norvasc, catapres, imdur for now PRN hydralazine for SBP > 170  RENAL A:   ESRD >> HD T, Th, Sa as outpt. P:   Consult renal 2/09  GASTROINTESTINAL A:   Shock liver >> improved. Nutrition. Hx of GERD. P:   Protonix for SUP Tube feeds  HEMATOLOGIC A:   Anemia of chronic disease.  P:  F/u CBC SQ heparin for DVT prevention   INFECTIOUS A:  Developing RLL ASD >> concern for aspiration PNA. P:   Day 1 unasyn  Blood 2/07 >> Sputum 2/07 >>  ENDOCRINE A:   DM type II with renal complications. P:   SSI  NEUROLOGIC A:   Acute encephalopathy after cardiac arrest. Status epilepticus noted on EEG 2/08. P:   Depakote, keppra per neurology PRN ativan for seizures RASS goal 0  No family at bedside  CC time 35 minutes.  Coralyn HellingVineet Yanil Dawe, MD [facility] 06/09/2014, 8:58 AM Pager:  [phone] After 3pm call: [phone]

## 2014-06-09 NOTE — Procedures (Signed)
History: 69 yo F with cardiac arrest.    Technique: This is a 17 channel prolonged scalp EEG performed at the bedside with bipolar and monopolar montages arranged in accordance to the international 10/20 system of electrode placement. One channel was dedicated to EKG recording.    Background: There are persistent generalized periodic discharges throughout this study. On the onset of recording, compared to the earlier recording, there has been improvement, however there conitnue to be generalized, or at times right sided predominant discharges at a frequency of 1 - 1.5 Hz. In addition, there is irregular delta and theta activity. There are rare occasions where there appears to be some evolution, lasting 2 -3 seconds only.   Photic stimulation: Physiologic driving is not performed  EEG Abnormalities: 1) Generalized periodic discharges 2) Occasional brief periods of evolution concerning for ictal discharge.   Clinical Interpretation: This EEG is indicative of cerebral dysfunction with an epileptogenic propensity. The presence of GPEDs following cardiac arrest is strongly suggestive of neurological injury, though with other background activity, I do not feel that EEG alone without other poor neurological prognostic indicators would be definitive.   Ritta SlotMcNeill Zidane Renner, MD [facility] [phone]  If 7pm- 7am, please page neurology on call as listed in AMION.

## 2014-06-09 NOTE — Consult Note (Signed)
[facility] Renal Consultation Note    Indication for Consultation:  Management of ESRD/hemodialysis; anemia, hypertension/volume and secondary hyperparathyroidism Primary Nephrologist - Dr. Kristian Covey  HPI: [name] is a 69 y.o. female with ESRD, DM, HTN, EF 40-45% 2/7 on TTS HD in [place] who presented with cardiac arrest 2/6.  Initially she was found to be choking by her sister and was unresponsive.  EMS was called and arrived quickly with CPR for 30-40 min before TOSC.  She arrested again enroute to [facility] ED and again in the [facility] ED.  A large amount of meat was recovered when her airway was changed.  EKG showed CHB and required intermittetent pacing and then was transferred to [facility].  She arrived  Intubated with posturing noted. She has been intubated/sedated with normothermia protocol. She was rewarmed Monday.  EEG showed status epilepticus. CXR today shows pulmonary edema, small right pleural effusion, cannot exclude PNA  Past Medical History  Diagnosis Date  . Type 2 diabetes mellitus   . Hypercholesteremia   . Essential hypertension, benign   . UTI (urinary tract infection)   . Ruptured appendix   . Iron deficiency anemia   . Noncompliance   . Renal insufficiency     Dialysis Tues, Thurs, & Sat  . Dialysis patient     Tuesday, Thursday and Saturday   . GERD (gastroesophageal reflux disease)   . 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 Appendectomy  2008    Exploratory laparotomy  . Left carpal tunnel sugrey    . Stitch granuloma excision  2009  . Esophagogastroduodenoscopy  01/19/2011    Procedure: ESOPHAGOGASTRODUODENOSCOPY (EGD);  Surgeon: Malissa Hippo, MD;  Location: [facility] ENDO SUITE;  Service: Endoscopy;  Laterality: N/A;  . Abdominal hysterectomy    . Tonsillectomy    . Cataract extraction w/phaco  07/10/2011    Procedure: CATARACT EXTRACTION PHACO AND INTRAOCULAR  LENS PLACEMENT (IOC);  Surgeon: Gemma Payor, MD;  Location: [facility] ORS;  Service: Ophthalmology;  Laterality: Left;  CDE: 20.36  . Cataract extraction w/phaco  08/07/2011    Procedure: CATARACT EXTRACTION PHACO AND INTRAOCULAR LENS PLACEMENT (IOC);  Surgeon: Gemma Payor, MD;  Location: [facility] ORS;  Service: Ophthalmology;  Laterality: Right;  CDE:15.31   Family History  Problem Relation Age of Onset  . Hypertension    . Anesthesia problems Neg Hx   . Hypotension Neg Hx   . Malignant hyperthermia Neg Hx   . Pseudochol deficiency Neg Hx    Social History:  reports that she has never smoked. She has never used smokeless tobacco. She reports that she does not drink alcohol or use illicit drugs. No Known Allergies Prior to Admission medications   Medication Sig Start Date End Date Taking? Authorizing Provider  amLODipine (NORVASC) 10 MG tablet Take 10 mg by mouth daily.   Yes Historical Provider, MD  b complex-vitamin c-folic acid (NEPHRO-VITE) 0.8 MG TABS tablet Take 1 tablet by mouth daily.   Yes Historical Provider, MD  carvedilol (COREG) 12.5 MG tablet Take 12.5 mg by mouth 2 (two) times daily.   Yes Historical Provider, MD  cloNIDine (CATAPRES) 0.1 MG tablet Take 0.1 mg by mouth 2 (two) times daily. Take before dialysis   Yes Historical Provider, MD  HYDROcodone-acetaminophen (NORCO/VICODIN) 5-325 MG per tablet Take 1 tablet by mouth every 6 (six) hours as needed (pain).   Yes Historical  Provider, MD  isosorbide mononitrate (IMDUR) 60 MG 24 hr tablet Take 60 mg by mouth daily.   Yes Historical Provider, MD  meclizine (ANTIVERT) 12.5 MG tablet Take 12.5 mg by mouth every 4 (four) hours as needed for dizziness.   Yes Historical Provider, MD  Nutritional Supplements (NEPRO) LIQD Take 1 Can by mouth 2 (two) times daily.   Yes Historical Provider, MD  pantoprazole (PROTONIX) 40 MG tablet Take 40 mg by mouth daily.   Yes Historical Provider, MD  sevelamer carbonate (RENVELA) 800 MG tablet Take 800-2,400 mg  by mouth 3 (three) times daily with meals. Patient takes 3 tabs with meals and 1 tab with snack   Yes Historical Provider, MD   Current Facility-Administered Medications  Medication Dose Route Frequency Provider Last Rate Last Dose  . 0.9 %  sodium chloride infusion  250 mL Intravenous PRN Wadie Lessen, MD      . 0.9 %  sodium chloride infusion   Intravenous Continuous Kalman Shan, MD 10 mL/hr at 06/08/14 2002    . 0.9 %  sodium chloride infusion   Intravenous Continuous Kalman Shan, MD 10 mL/hr at 06/08/14 2108    . acetaminophen (TYLENOL) solution 650 mg  650 mg Per Tube Q6H PRN Coralyn Helling, MD       Or  . acetaminophen (TYLENOL) suppository 650 mg  650 mg Rectal Q4H PRN Coralyn Helling, MD      . ampicillin-sulbactam (UNASYN) 1.5 g in sodium chloride 0.9 % 50 mL IVPB  1.5 g Intravenous Q24H Benny Lennert, [facility]      . antiseptic oral rinse (CPC / CETYLPYRIDINIUM CHLORIDE 0.05%) solution 7 mL  7 mL Mouth Rinse QID Kalman Shan, MD   7 mL at 06/09/14 0400  . chlorhexidine (PERIDEX) 0.12 % solution 15 mL  15 mL Mouth Rinse BID Kalman Shan, MD   15 mL at 06/09/14 0755  . feeding supplement (PRO-STAT SUGAR FREE 64) liquid 30 mL  30 mL Per Tube QID Heather Cornelison Pitts, RD   30 mL at 06/09/14 0954  . feeding supplement (VITAL HIGH PROTEIN) liquid 1,000 mL  1,000 mL Per Tube Q24H Heather Cornelison Pitts, RD      . fentaNYL (SUBLIMAZE) injection 25-50 mcg  25-50 mcg Intravenous Q2H PRN Coralyn Helling, MD      . heparin injection 5,000 Units  5,000 Units Subcutaneous 3 times per day Zigmund Gottron, MD   5,000 Units at 06/09/14 0531  . hydrALAZINE (APRESOLINE) injection 5 mg  5 mg Intravenous Q4H PRN Coralyn Helling, MD      . insulin aspart (novoLOG) injection 0-9 Units  0-9 Units Subcutaneous 6 times per day Coralyn Helling, MD   0 Units at 06/08/14 1200  . levETIRAcetam (KEPPRA) IVPB 1000 mg/100 mL premix  1,000 mg Intravenous Q24H Omelia Blackwater, DO      . LORazepam  (ATIVAN) injection 2 mg  2 mg Intravenous PRN Coralyn Helling, MD      . norepinephrine (LEVOPHED) 4 mg in dextrose 5 % 250 mL (0.016 mg/mL) infusion  2-50 mcg/min Intravenous Titrated Coralyn Helling, MD   Stopped at 06/09/14 1100  . ondansetron (ZOFRAN) injection 4 mg  4 mg Intravenous Q6H PRN Wadie Lessen, MD      . pantoprazole sodium (PROTONIX) 40 mg/20 mL oral suspension 40 mg  40 mg Per Tube Q24H Coralyn Helling, MD   40 mg at 06/09/14 0954  . sodium chloride 0.9 % injection 10-40 mL  10-40 mL  Intracatheter Q12H Kalman ShanMurali Ramaswamy, MD   10 mL at 06/09/14 0955  . sodium chloride 0.9 % injection 10-40 mL  10-40 mL Intracatheter PRN Kalman ShanMurali Ramaswamy, MD      . valproate (DEPACON) 500 mg in dextrose 5 % 50 mL IVPB  500 mg Intravenous TID Ritta SlotMcNeill Kirkpatrick, MD   500 mg at 06/09/14 0954   Labs: Basic Metabolic Panel:  Recent Labs Lab 06/07/14 0400 06/07/14 1130 06/08/14 0400 06/08/14 0524 06/09/14 0330  NA 140 139 144 140 138  K 3.4* 3.8 2.9* 4.1 4.4  CL 101 101 115* 100 99  CO2 30 27 22   --  24  GLUCOSE 134* 121* 50* 75 73  BUN 32* 32* 30* 37* 66*  CREATININE 3.13* 3.29* 2.99* 4.20* 4.61*  CALCIUM 8.1* 7.9* 5.8*  --  7.3*  PHOS 1.9*  --  3.5  --  5.8*   Liver Function Tests:  Recent Labs Lab 03-29-15 1907 03-29-15 2245 06/08/14 0400 06/09/14 0330  AST 194* 257* 64*  --   ALT 190* 204* 85*  --   ALKPHOS 80 86 50  --   BILITOT 0.9 1.3* 0.5  --   PROT 6.6 6.6 4.0*  --   ALBUMIN 3.2* 3.3* 2.0* 2.5*  CBC:  Recent Labs Lab 03-29-15 1907 03-29-15 2245 06/07/14 0400 06/08/14 0400 06/08/14 0500 06/08/14 0524 06/09/14 0330  WBC 16.8* 13.9* 15.8* 7.9 9.6  --  9.1  NEUTROABS 11.3* 12.5*  --   --   --   --   --   HGB 10.2* 9.8* 9.0* 6.7* 8.7* 8.2* 8.0*  HCT 31.2* 29.1* 26.2* 20.0* 25.3* 24.0* 24.3*  MCV 98.7 93.6 93.2 93.9 94.1  --  94.9  PLT 301 279 240 152 198  --  251   Cardiac Enzymes:  Recent Labs Lab 03-29-15 1907 03-29-15 2245 06/07/14 0400 06/07/14 1036   TROPONINI 0.09* 0.38* 0.76* 0.92*   CBG:  Recent Labs Lab 06/07/14 2334 06/08/14 0351 06/08/14 0724 06/08/14 0728 06/08/14 0751  GLUCAP 81 73 65* 66* 197*   Studies/Results: Dg Chest Port 1 View  06/09/2014   CLINICAL DATA:  Respiratory failure.  Shortness of breath.  EXAM: PORTABLE CHEST - 1 VIEW  COMPARISON:  06/07/2014.  FINDINGS: Endotracheal tube and NG tube in stable position. Cardiomegaly with pulmonary vascular prominence, diffuse pulmonary interstitial, and bibasilar infiltrates prominence noted on today's exam. Findings suggest congestive heart failure. Bibasilar pneumonia cannot be excluded. Small right pleural effusion cannot be excluded. No pneumothorax. Left subclavian vascular stent noted.  IMPRESSION: 1. Lines and tubes in stable position. 2. Changes of congestive heart failure with pulmonary edema and small right pleural effusion noted on today's exam. Bibasilar pneumonia cannot be excluded. 3. Left subclavian vascular stent in stable position.   Electronically Signed   By: Maisie Fushomas  Register   On: 06/09/2014 07:18   Dg Chest Port 1 View  06/07/2014   CLINICAL DATA:  Respiratory failure  EXAM: PORTABLE CHEST - 1 VIEW  COMPARISON:  2014-10-12  FINDINGS: Pacing pads overlie the chest. Endotracheal and nasogastric tubes are appropriately positioned. Mild enlargement of the cardiomediastinal silhouette is reidentified with central vascular congestion. Lungs are slightly less well aerated on the prior exam with crowding of the lung markings at the bases. Retrocardiac patchy airspace opacity is identified. Trace pleural fluid.  IMPRESSION: New patchy retrocardiac opacity which is nonspecific and could indicate atelectasis, contusion if there has been cardiopulmonary resuscitation, or pneumonia.   Electronically Signed   By: Vinnie LangtonGretchen  Green M.D.   On: 06/07/2014 15:36    ROS: Unable to obtain due to sedation/intubation Physical Exam: Filed Vitals:   06/09/14 1000 06/09/14 1100  06/09/14 1110 06/09/14 1200  BP:   139/47 109/40  Pulse: 56 55  54  Temp: 98.6 F (37 C)   98.8 F (37.1 C)  TempSrc:      Resp: 20 21 16 24   Height:      Weight:      SpO2: 100% 100%  98%     General: Intubated, NAD HEENT:  ETT Neck no JVD Lungs: .rewarming pads on chest some coarse BS Heart: regular - brady 50s Abdomen: distended, with ventral hernia, warming pads Lower extremities: warming pads on thighs, SCDs on LE Neuro: unresponsive Dialysis Access:left upper AVGG + bruit  Dialysis Orders: Center: [facility]. Kristian Covey; Hecterol 2.5 Epo 1000 3x/week venofer 50 per week 3 hr 2 K 2.5 Ca 400/600 36 degrees left upper AVGG no heparin EDW 47.5 - last treatment got to 46.2 with post BP of 191/53 Most recent labs:  Hgb 10.4 1/12 iPTH 475 Hep BsAg neg 1/12  Assessment/Plan: 1.  Acute hypoxic respiratory failure secondary to asphyxiation of food and subsequent cardiac arrest x 3- on Vent/ support per Cards, CCM, and Neuro, empiric antibiotics ? Asp PNA 2.  ESRD -  TTS [facility] - provide dialysis support- had been getting below EDW but BP ^ 3.  Hypertension/volume  - outpt meds on hold with prn hydralazine; CHF/pleural effusions, gently lower volume averages 1 - 2 L UF per HD- BP much lower now, though has been variable. 4.  Anemia of chronic disease down to 8: hold on starting ESA - if it appears HD is to continue, will dose Thursday 5.  Metabolic bone disease -  Continue hectorol, 2.5 bath, corr Ca 2.5 6.  Nutrition -  TF 7.  DM SSI  Sheffield Slider, PA-C [facility], [facility] Beeper [phone] 06/09/2014, 12:42 PM

## 2014-06-10 ENCOUNTER — Inpatient Hospital Stay (HOSPITAL_COMMUNITY): Payer: Medicare Other

## 2014-06-10 LAB — RENAL FUNCTION PANEL
ALBUMIN: 2.6 g/dL — AB (ref 3.5–5.2)
Anion gap: 15 (ref 5–15)
BUN: 26 mg/dL — AB (ref 6–23)
CALCIUM: 8 mg/dL — AB (ref 8.4–10.5)
CHLORIDE: 101 mmol/L (ref 96–112)
CO2: 24 mmol/L (ref 19–32)
CREATININE: 1.81 mg/dL — AB (ref 0.50–1.10)
GFR calc Af Amer: 32 mL/min — ABNORMAL LOW (ref >90)
GFR, EST NON AFRICAN AMERICAN: 27 mL/min — AB (ref >90)
Glucose, Bld: 99 mg/dL (ref 70–99)
PHOSPHORUS: 2.4 mg/dL (ref 2.3–4.6)
Potassium: 2.9 mmol/L — ABNORMAL LOW (ref 3.5–5.1)
Sodium: 140 mmol/L (ref 135–145)

## 2014-06-10 LAB — BASIC METABOLIC PANEL
Anion gap: 11 (ref 5–15)
BUN: 23 mg/dL (ref 6–23)
CHLORIDE: 101 mmol/L (ref 96–112)
CO2: 26 mmol/L (ref 19–32)
CREATININE: 1.91 mg/dL — AB (ref 0.50–1.10)
Calcium: 7.8 mg/dL — ABNORMAL LOW (ref 8.4–10.5)
GFR calc non Af Amer: 26 mL/min — ABNORMAL LOW (ref >90)
GFR, EST AFRICAN AMERICAN: 30 mL/min — AB (ref >90)
Glucose, Bld: 89 mg/dL (ref 70–99)
POTASSIUM: 3.1 mmol/L — AB (ref 3.5–5.1)
Sodium: 138 mmol/L (ref 135–145)

## 2014-06-10 LAB — GLUCOSE, CAPILLARY
GLUCOSE-CAPILLARY: 80 mg/dL (ref 70–99)
Glucose-Capillary: 72 mg/dL (ref 70–99)
Glucose-Capillary: 74 mg/dL (ref 70–99)
Glucose-Capillary: 75 mg/dL (ref 70–99)
Glucose-Capillary: 77 mg/dL (ref 70–99)
Glucose-Capillary: 83 mg/dL (ref 70–99)
Glucose-Capillary: 86 mg/dL (ref 70–99)
Glucose-Capillary: 91 mg/dL (ref 70–99)

## 2014-06-10 LAB — CBC
HEMATOCRIT: 25.6 % — AB (ref 36.0–46.0)
HEMOGLOBIN: 8.8 g/dL — AB (ref 12.0–15.0)
MCH: 31.9 pg (ref 26.0–34.0)
MCHC: 34.4 g/dL (ref 30.0–36.0)
MCV: 92.8 fL (ref 78.0–100.0)
Platelets: 233 10*3/uL (ref 150–400)
RBC: 2.76 MIL/uL — AB (ref 3.87–5.11)
RDW: 14.3 % (ref 11.5–15.5)
WBC: 10.9 10*3/uL — AB (ref 4.0–10.5)

## 2014-06-10 LAB — TRIGLYCERIDES: Triglycerides: 137 mg/dL (ref <150)

## 2014-06-10 MED ORDER — SODIUM CHLORIDE 0.9 % IV BOLUS (SEPSIS)
500.0000 mL | Freq: Once | INTRAVENOUS | Status: AC
  Administered 2014-06-11: 500 mL via INTRAVENOUS

## 2014-06-10 NOTE — Progress Notes (Signed)
Admit: 06/21/2014 LOS: 4  51F ESRD ([facility] [facility]) w/ cardiac arrest, anoxic brain injury from 06/05/2014  Subjective:  HD yesterday Very poor prognosis neurologically   02/09 0701 - 02/10 0700 In: 1481.4 [I.V.:536.4; NG/GT:580; IV Piggyback:365] Out: 1540 [Urine:40]  Filed Weights   06/08/14 0500 06/09/14 0400 06/10/14 0500  Weight: 52.7 kg (116 lb 2.9 oz) 49.7 kg (109 lb 9.1 oz) 52.23 kg (115 lb 2.3 oz)    Scheduled Meds: . ampicillin-sulbactam (UNASYN) IV  1.5 g Intravenous Q24H  . antiseptic oral rinse  7 mL Mouth Rinse QID  . chlorhexidine  15 mL Mouth Rinse BID  . [START ON 06/28/2014] darbepoetin (ARANESP) injection - DIALYSIS  40 mcg Intravenous Q Thu-HD  . feeding supplement (PRO-STAT SUGAR FREE 64)  30 mL Per Tube QID  . feeding supplement (VITAL HIGH PROTEIN)  1,000 mL Per Tube Q24H  . heparin  5,000 Units Subcutaneous 3 times per day  . insulin aspart  0-9 Units Subcutaneous 6 times per day  . levETIRAcetam  1,000 mg Intravenous Q24H  . pantoprazole sodium  40 mg Per Tube Q24H  . sodium chloride  10-40 mL Intracatheter Q12H  . valproate sodium  500 mg Intravenous TID   Continuous Infusions: . sodium chloride 10 mL/hr at 06/09/14 2000  . sodium chloride 10 mL/hr at 06/10/14 0500  . norepinephrine (LEVOPHED) Adult infusion Stopped (06/10/14 0400)   PRN Meds:.sodium chloride, acetaminophen (TYLENOL) oral liquid 160 mg/5 mL **OR** acetaminophen, fentaNYL, hydrALAZINE, LORazepam, ondansetron (ZOFRAN) IV, sodium chloride  Current Labs: reviewed    Physical Exam:  Blood pressure 110/39, pulse 57, temperature 98.8 F (37.1 C), temperature source Core (Comment), resp. rate 24, height 5\' 1"  (1.549 m), weight 52.23 kg (115 lb 2.3 oz), SpO2 99 %. ETT NCAT Eyes closed Regular, brady, nl s1s CTAB AVG + B/T  Dialysis Orders: Center: [facility] [place] [facility]; Hecterol 2.5 Epo 1000 3x/week venofer 50 per week 3 hr 2 K 2.5 Ca 400/600 36 degrees left upper AVGG no  heparin EDW 47.5 - last treatment got to 46.2 with post BP of 191/53 Most recent labs: Hgb 10.4 1/12 iPTH 475 Hep BsAg neg 1/12  A/P 1. ESRD 1. Next Tx 2/11 while family comes together 2. Pt is no longer a candidate for outpt HD given #2 2. Anoxic Brain injury 3. VDRF 4. 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  Recent Labs Lab 12/04/14 1907 12/04/14 2245  06/08/14 0500 06/08/14 0524 06/09/14 0330 06/10/14 0229  WBC 16.8* 13.9*  < > 9.6  --  9.1 10.9*  NEUTROABS 11.3* 12.5*  --   --   --   --   --   HGB 10.2* 9.8*  < > 8.7* 8.2* 8.0* 8.8*  HCT 31.2* 29.1*  < > 25.3* 24.0* 24.3* 25.6*  MCV 98.7 93.6  < >  94.1  --  94.9 92.8  PLT 301 279  < > 198  --  251 233  < > = values in this interval not displayed.

## 2014-06-10 NOTE — Progress Notes (Signed)
PULMONARY / CRITICAL CARE MEDICINE   Name: Shelby MoralesSarah A Foster MRN: [id number] DOB: 1945/09/17    ADMISSION DATE:  06/23/2014  [facility]ING [facility] :  [facility]  CHIEF COMPLAINT:  S/p cardiac arrest  INITIAL PRESENTATION:  69 yo female with asphyxiation on food with respiratory leading to cardiac arrest with about 40 minutes before ROSC.  STUDIES:  2/06 CT head >> cerebral atrophy, no acute findings 2/07 Echo >> EF 40 to 45%, mod LVH 2/07 EEG >> status epilepticus  SIGNIFICANT EVENTS: 2/06 admit, cardiology consult, hypothermia 2/08 neurology consult 2/09 nephrology consulted  SUBJECTIVE:  Tolerates SBT.  VITAL SIGNS: Temp:  [98.4 F (36.9 C)-99 F (37.2 C)] 98.6 F (37 C) (02/10 1000) Pulse Rate:  [38-65] 61 (02/10 1000) Resp:  [0-33] 29 (02/10 1000) BP: (94-164)/(31-68) 144/31 mmHg (02/10 1000) SpO2:  [97 %-100 %] 100 % (02/10 1000) Arterial Line BP: (97-164)/(31-58) 152/39 mmHg (02/10 1000) FiO2 (%):  [40 %] 40 % (02/10 0940) Weight:  [115 lb 2.3 oz (52.23 kg)] 115 lb 2.3 oz (52.23 kg) (02/10 0500) HEMODYNAMICS:   VENTILATOR SETTINGS: Vent Mode:  [-] CPAP FiO2 (%):  [40 %] 40 % Set Rate:  [14 bmp] 14 bmp Vt Set:  [400 mL] 400 mL PEEP:  [5 cmH20] 5 cmH20 Pressure Support:  [5 cmH20] 5 cmH20 Plateau Pressure:  [16 cmH20-20 cmH20] 16 cmH20 INTAKE / OUTPUT:  Intake/Output Summary (Last 24 hours) at 06/10/14 1051 Last data filed at 06/10/14 1011  Gross per 24 hour  Intake 1312.68 ml  Output   1540 ml  Net -227.32 ml    PHYSICAL EXAMINATION: General: no distress Neuro: comatose HEENT: ETT in place  Cardiovascular: regular Lungs: scattered rhonchi Abdomen:  Soft, no guarding, 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 Musculoskeletal: fistula Lt arm Skin: no rashes  LABS:  CBC  Recent Labs Lab 06/08/14 0500 06/08/14 0524 06/09/14 0330 06/10/14 0229  WBC 9.6  --  9.1 10.9*  HGB 8.7* 8.2* 8.0* 8.8*  HCT 25.3* 24.0* 24.3* 25.6*  PLT 198  --  251 233    Coag's  Recent Labs Lab 06/07/2014 2245 06/07/14 1036  APTT 33  --   INR 1.18 1.20   BMET  Recent Labs Lab 06/09/14 0330 06/10/14 0229 06/10/14 0420  NA 138 140 138  K 4.4 2.9* 3.1*  CL 99 101 101  CO2 24 24 26   BUN 66* 26* 23  CREATININE 4.61* 1.81* 1.91*  GLUCOSE 73 99 89   Electrolytes  Recent Labs Lab 06/07/14 0400  06/08/14 0400 06/09/14 0330 06/10/14 0229 06/10/14 0420  CALCIUM 8.1*  < > 5.8* 7.3* 8.0* 7.8*  MG 1.8  --  1.4* 1.9  --   --   PHOS 1.9*  --  3.5 5.8* 2.4  --   < > = values in this interval not displayed.   Sepsis Markers  Recent Labs Lab 06/08/2014 2300  LATICACIDVEN 1.7   ABG  Recent Labs Lab 06/07/14 0343 06/08/14 0349 06/09/14 1112  PHART 7.496* 7.461* 7.414  PCO2ART 38.2 38.0 38.9  PO2ART 72.0* 146.0* 142.0*   Liver Enzymes  Recent Labs Lab 06/27/2014 1907 06/07/2014 2245 06/08/14 0400 06/09/14 0330 06/10/14 0229  AST 194* 257* 64*  --   --   ALT 190* 204* 85*  --   --   ALKPHOS 80 86 50  --   --   BILITOT 0.9 1.3* 0.5  --   --   ALBUMIN 3.2* 3.3* 2.0* 2.5* 2.6*  Cardiac Enzymes  Recent Labs Lab 16-Jun-2014 2245 06/07/14 0400 06/07/14 1036  TROPONINI 0.38* 0.76* 0.92*   Glucose  Recent Labs Lab 06/09/14 1159 06/09/14 1234 06/09/14 1602 06/09/14 2108 06/09/14 2336 06/10/14 0401  GLUCAP 68* 187* 84 77 83 86    Imaging Dg Chest Port 1 View  06/10/2014   CLINICAL DATA:  Aspiration pneumonia.  EXAM: PORTABLE CHEST - 1 VIEW  COMPARISON:  06/09/2014.  FINDINGS: Endotracheal tube and NG tube in stable position. Stable cardiomegaly. Persistent bibasilar pulmonary alveolar infiltrates. There has been slight clearing. Small bilateral pleural effusions. No pneumothorax. Left subclavian vascular stent in stable position.  IMPRESSION: 1. Lines and tubes in stable position. 2. Stable cardiomegaly. 3. Persistent bibasilar pulmonary infiltrates/edema with slight clearing. Small bilateral effusions.   Electronically Signed    By: Maisie Fus  Register   On: 06/10/2014 07:26   Dg Chest Port 1 View  06/09/2014   CLINICAL DATA:  Respiratory failure.  Shortness of breath.  EXAM: PORTABLE CHEST - 1 VIEW  COMPARISON:  06/07/2014.  FINDINGS: Endotracheal tube and NG tube in stable position. Cardiomegaly with pulmonary vascular prominence, diffuse pulmonary interstitial, and bibasilar infiltrates prominence noted on today's exam. Findings suggest congestive heart failure. Bibasilar pneumonia cannot be excluded. Small right pleural effusion cannot be excluded. No pneumothorax. Left subclavian vascular stent noted.  IMPRESSION: 1. Lines and tubes in stable position. 2. Changes of congestive heart failure with pulmonary edema and small right pleural effusion noted on today's exam. Bibasilar pneumonia cannot be excluded. 3. Left subclavian vascular stent in stable position.   Electronically Signed   By: Maisie Fus  Register   On: 06/09/2014 07:18      ASSESSMENT / PLAN:  PULMONARY ETT 06-16-14 A:  Acute hypoxic respiratory failure 2nd to asphyxiation of food. P:   Pressure support wean as tolerated >> no extubation due to mental status F/u CXR  CARDIOVASCULAR Lt femoral CVL 2/06 >> Lt femoral a line 2/06 >> A:  Respiratory leading to cardiac arrest. Hx of HTN, HLD. Non ischemic CM. Elevated troponin 2nd to demand ischemia. P:  Monitor hemodynamics Defer further cardiac assessment until neuro status better defined Hold outpt coreg, norvasc, catapres, imdur for now PRN hydralazine for SBP > 170  RENAL A:   ESRD >> HD T, Th, Sa as outpt. P:   HD per renal  GASTROINTESTINAL A:   Shock liver >> improved. Nutrition. Hx of GERD. P:   Protonix for SUP Tube feeds  HEMATOLOGIC A:   Anemia of chronic disease.  P:  F/u CBC SQ heparin for DVT prevention   INFECTIOUS A:  Developing RLL ASD >> concern for aspiration PNA. P:   Day 2 unasyn  Blood 2/07 >>  ENDOCRINE A:  DM type II with renal complications. P:    SSI  NEUROLOGIC A:   Acute encephalopathy after cardiac arrest. Status epilepticus noted on EEG 2/08. P:   Depakote, keppra per neurology PRN ativan for seizures RASS goal 0  Summary: She has been off sedation since 2/08.  Concern prognosis for neuro recovery is grim.  Will try to set up family meeting for 2/11.  CC time 35 minutes.  Coralyn Helling, MD [facility] Pulmonary/Critical Care 06/10/2014, 10:51 AM Pager:  [phone] After 3pm call: [phone]

## 2014-06-10 NOTE — Progress Notes (Signed)
Subjective: No overnight events. Keppra decreased to 1000mg  daily  LTM shows GPEDs suggesting neurologic injury.    Objective: Current vital signs: BP 113/38 mmHg  Pulse 53  Temp(Src) 98.8 F (37.1 C) (Core (Comment))  Resp 21  Ht 5\' 1"  (1.549 m)  Wt 52.23 kg (115 lb 2.3 oz)  BMI 21.77 kg/m2  SpO2 100% Vital signs in last 24 hours: Temp:  [98.4 F (36.9 C)-99 F (37.2 C)] 98.8 F (37.1 C) (02/10 0600) Pulse Rate:  [38-65] 53 (02/10 0600) Resp:  [0-33] 21 (02/10 0600) BP: (94-176)/(33-68) 113/38 mmHg (02/10 0600) SpO2:  [97 %-100 %] 100 % (02/10 0600) Arterial Line BP: (97-169)/(31-58) 122/32 mmHg (02/10 0600) FiO2 (%):  [40 %] 40 % (02/10 0339) Weight:  [52.23 kg (115 lb 2.3 oz)] 52.23 kg (115 lb 2.3 oz) (02/10 0500)  Intake/Output from previous day: 02/09 0701 - 02/10 0700 In: 1461.4 [I.V.:516.4; NG/GT:580; IV Piggyback:365] Out: 1540 [Urine:40] Intake/Output this shift:   Nutritional status: Diet NPO time specified  Neurologic Exam: Mental Status: Patient does not respond to verbal stimuli. Does not respond to deep sternal rub. Does not follow commands. No verbalizations noted.  Cranial Nerves: II: patient does not respond confrontation bilaterally, pupils right 2 mm, left 2 mm,and non-reactive bilaterally III,IV,VI: doll's response absent bilaterally.  V,VII: corneal reflex absent bilaterally  VIII: patient does not respond to verbal stimuli IX,X: gag reflex absent, XI: trapezius strength unable to test bilaterally XII: tongue strength unable to test Motor: Extremities flaccid throughout. No spontaneous movement noted. Intermittent extension bilateral UE to noxious stimuli Sensory: Does not respond to noxious stimuli in in UE with triple reflex in bilateral LE. Deep Tendon Reflexes:  2+ throughout Plantars: upgoing bilaterally Cerebellar: Unable to perform  Lab Results: Basic Metabolic Panel:  Recent Labs Lab 06/10/2014 2245 06/07/14 0400  06/07/14 1130 06/08/14 0400 06/08/14 0524 06/09/14 0330 06/10/14 0229 06/10/14 0420  NA 142 140 139 144 140 138 140 138  K 4.6 3.4* 3.8 2.9* 4.1 4.4 2.9* 3.1*  CL 102 101 101 115* 100 99 101 101  CO2 30 30 27 22   --  24 24 26   GLUCOSE 156* 134* 121* 50* 75 73 99 89  BUN 26* 32* 32* 30* 37* 66* 26* 23  CREATININE 3.02* 3.13* 3.29* 2.99* 4.20* 4.61* 1.81* 1.91*  CALCIUM 8.3* 8.1* 7.9* 5.8*  --  7.3* 8.0* 7.8*  MG 2.0 1.8  --  1.4*  --  1.9  --   --   PHOS 3.3 1.9*  --  3.5  --  5.8* 2.4  --     Liver Function Tests:  Recent Labs Lab 06/28/2014 1907 06/10/2014 2245 06/08/14 0400 06/09/14 0330 06/10/14 0229  AST 194* 257* 64*  --   --   ALT 190* 204* 85*  --   --   ALKPHOS 80 86 50  --   --   BILITOT 0.9 1.3* 0.5  --   --   PROT 6.6 6.6 4.0*  --   --   ALBUMIN 3.2* 3.3* 2.0* 2.5* 2.6*   No results for input(s): LIPASE, AMYLASE in the last 168 hours. No results for input(s): AMMONIA in the last 168 hours.  CBC:  Recent Labs Lab 06/02/2014 1907 06/10/2014 2245 06/07/14 0400 06/08/14 0400 06/08/14 0500 06/08/14 0524 06/09/14 0330 06/10/14 0229  WBC 16.8* 13.9* 15.8* 7.9 9.6  --  9.1 10.9*  NEUTROABS 11.3* 12.5*  --   --   --   --   --   --  HGB 10.2* 9.8* 9.0* 6.7* 8.7* 8.2* 8.0* 8.8*  HCT 31.2* 29.1* 26.2* 20.0* 25.3* 24.0* 24.3* 25.6*  MCV 98.7 93.6 93.2 93.9 94.1  --  94.9 92.8  PLT 301 279 240 152 198  --  251 233    Cardiac Enzymes:  Recent Labs Lab Jun 08, 2014 1907 06/08/2014 2245 06/07/14 0400 06/07/14 1036  TROPONINI 0.09* 0.38* 0.76* 0.92*    Lipid Panel:  Recent Labs Lab 06/07/14 0300 06/10/14 0229  TRIG 29 137    CBG:  Recent Labs Lab 06/09/14 1234 06/09/14 1602 06/09/14 2108 06/09/14 2336 06/10/14 0401  GLUCAP 187* 84 77 83 86    Microbiology: Results for orders placed or performed during the hospital encounter of June 08, 2014  Culture, blood (routine x 2)     Status: None (Preliminary result)   Collection Time: 06/07/14 12:25 AM   Result Value Ref Range Status   Specimen Description BLOOD CENTRAL LINE  Final   Special Requests BOTTLES DRAWN AEROBIC AND ANAEROBIC 10CC EA  Final   Culture   Final           BLOOD CULTURE RECEIVED NO GROWTH TO DATE CULTURE WILL BE HELD FOR 5 DAYS BEFORE ISSUING A FINAL NEGATIVE REPORT Note: Culture results may be compromised due to an excessive volume of blood received in culture bottles. Performed at [facility]    Report Status PENDING  Incomplete  Culture, blood (routine x 2)     Status: None (Preliminary result)   Collection Time: 06/07/14 12:27 AM  Result Value Ref Range Status   Specimen Description BLOOD RIGHT ARM  Final   Special Requests BOTTLES DRAWN AEROBIC ONLY 2CC  Final   Culture   Final           BLOOD CULTURE RECEIVED NO GROWTH TO DATE CULTURE WILL BE HELD FOR 5 DAYS BEFORE ISSUING A FINAL NEGATIVE REPORT Performed at [facility]    Report Status PENDING  Incomplete  MRSA PCR Screening     Status: None   Collection Time: 06/07/14  1:42 AM  Result Value Ref Range Status   MRSA by PCR NEGATIVE NEGATIVE Final    Comment:        The GeneXpert MRSA Assay (FDA approved for NASAL specimens only), is one component of a comprehensive MRSA colonization surveillance program. It is not intended to diagnose MRSA infection nor to guide or monitor treatment for MRSA infections.   Culture, respiratory (tracheal aspirate)     Status: None   Collection Time: 06/07/14  4:00 AM  Result Value Ref Range Status   Specimen Description TRACHEAL ASPIRATE  Final   Special Requests NONE  Final   Gram Stain   Final    RARE WBC PRESENT, PREDOMINANTLY PMN NO SQUAMOUS EPITHELIAL CELLS SEEN FEW GRAM POSITIVE COCCI IN PAIRS IN CHAINS Performed at [facility]    Culture   Final    Non-Pathogenic Oropharyngeal-type Flora Isolated. Performed at [facility]    Report Status 06/09/2014 FINAL  Final  Urine culture     Status: None   Collection  Time: 06/07/14 10:29 AM  Result Value Ref Range Status   Specimen Description URINE, CATHETERIZED  Final   Special Requests NONE  Final   Colony Count NO GROWTH Performed at [facility]   Final   Culture NO GROWTH Performed at [facility]   Final   Report Status 06/09/2014 FINAL  Final    Coagulation Studies:  Recent Labs  06/07/14 1036  LABPROT 15.3*  INR 1.20    Imaging: Dg Chest Port 1 View  06/10/2014   CLINICAL DATA:  Aspiration pneumonia.  EXAM: PORTABLE CHEST - 1 VIEW  COMPARISON:  06/09/2014.  FINDINGS: Endotracheal tube and NG tube in stable position. Stable cardiomegaly. Persistent bibasilar pulmonary alveolar infiltrates. There has been slight clearing. Small bilateral pleural effusions. No pneumothorax. Left subclavian vascular stent in stable position.  IMPRESSION: 1. Lines and tubes in stable position. 2. Stable cardiomegaly. 3. Persistent bibasilar pulmonary infiltrates/edema with slight clearing. Small bilateral effusions.   Electronically Signed   By: Maisie Fus  Register   On: 06/10/2014 07:26   Dg Chest Port 1 View  06/09/2014   CLINICAL DATA:  Respiratory failure.  Shortness of breath.  EXAM: PORTABLE CHEST - 1 VIEW  COMPARISON:  06/07/2014.  FINDINGS: Endotracheal tube and NG tube in stable position. Cardiomegaly with pulmonary vascular prominence, diffuse pulmonary interstitial, and bibasilar infiltrates prominence noted on today's exam. Findings suggest congestive heart failure. Bibasilar pneumonia cannot be excluded. Small right pleural effusion cannot be excluded. No pneumothorax. Left subclavian vascular stent noted.  IMPRESSION: 1. Lines and tubes in stable position. 2. Changes of congestive heart failure with pulmonary edema and small right pleural effusion noted on today's exam. Bibasilar pneumonia cannot be excluded. 3. Left subclavian vascular stent in stable position.   Electronically Signed   By: Maisie Fus  Register   On: 06/09/2014 07:18     Medications:  Scheduled: . ampicillin-sulbactam (UNASYN) IV  1.5 g Intravenous Q24H  . antiseptic oral rinse  7 mL Mouth Rinse QID  . chlorhexidine  15 mL Mouth Rinse BID  . [START ON 06/18/2014] darbepoetin (ARANESP) injection - DIALYSIS  40 mcg Intravenous Q Thu-HD  . feeding supplement (PRO-STAT SUGAR FREE 64)  30 mL Per Tube QID  . feeding supplement (VITAL HIGH PROTEIN)  1,000 mL Per Tube Q24H  . heparin  5,000 Units Subcutaneous 3 times per day  . insulin aspart  0-9 Units Subcutaneous 6 times per day  . levETIRAcetam  1,000 mg Intravenous Q24H  . pantoprazole sodium  40 mg Per Tube Q24H  . sodium chloride  10-40 mL Intracatheter Q12H  . valproate sodium  500 mg Intravenous TID    Assessment/Plan:  69 YO female hx of DM, HTN, ESRD on HD, S/P cardiac arrest with prolonged ROSC (>40 minutes). During cooling phase had EEG which showed patient to be in status epilepticus. SE resolved with VPA and keppra but EEG continues to show GPEDs. . Continue VPA and keppra at this time. Will continue to follow but unfortunately guarded prognosis for meaningful neurological recovery based on clinical history and exam.    LOS: 4 days   Elspeth Cho, DO [facility] [phone]  If 7pm- 7am, please page neurology on call as listed in AMION. 06/10/2014  7:35 AM

## 2014-06-10 NOTE — Progress Notes (Signed)
eLink Physician-Brief Progress Note Patient Name: Lysle MoralesSarah A Villamar DOB: 03-Jan-1946 MRN: [id number]   Date of Service  06/10/2014  HPI/Events of Note  Hypotension following administration of valproate and keppra  eICU Interventions  500cc NS bolus given     Intervention Category Intermediate Interventions: Hypotension - evaluation and management  Traeger Sultana S. 06/10/2014, 11:57 PM

## 2014-06-11 ENCOUNTER — Inpatient Hospital Stay (HOSPITAL_COMMUNITY): Payer: Medicare Other

## 2014-06-11 DIAGNOSIS — Z66 Do not resuscitate: Secondary | ICD-10-CM

## 2014-06-11 DIAGNOSIS — E43 Unspecified severe protein-calorie malnutrition: Secondary | ICD-10-CM

## 2014-06-11 DIAGNOSIS — Z515 Encounter for palliative care: Secondary | ICD-10-CM

## 2014-06-11 LAB — CBC
HEMATOCRIT: 24.3 % — AB (ref 36.0–46.0)
Hemoglobin: 8.2 g/dL — ABNORMAL LOW (ref 12.0–15.0)
MCH: 32.3 pg (ref 26.0–34.0)
MCHC: 33.7 g/dL (ref 30.0–36.0)
MCV: 95.7 fL (ref 78.0–100.0)
Platelets: 197 10*3/uL (ref 150–400)
RBC: 2.54 MIL/uL — AB (ref 3.87–5.11)
RDW: 14.3 % (ref 11.5–15.5)
WBC: 5.6 10*3/uL (ref 4.0–10.5)

## 2014-06-11 LAB — RENAL FUNCTION PANEL
ALBUMIN: 2.1 g/dL — AB (ref 3.5–5.2)
Anion gap: 10 (ref 5–15)
BUN: 53 mg/dL — ABNORMAL HIGH (ref 6–23)
CO2: 26 mmol/L (ref 19–32)
Calcium: 7 mg/dL — ABNORMAL LOW (ref 8.4–10.5)
Chloride: 100 mmol/L (ref 96–112)
Creatinine, Ser: 3.09 mg/dL — ABNORMAL HIGH (ref 0.50–1.10)
GFR calc non Af Amer: 14 mL/min — ABNORMAL LOW (ref >90)
GFR, EST AFRICAN AMERICAN: 17 mL/min — AB (ref >90)
Glucose, Bld: 87 mg/dL (ref 70–99)
PHOSPHORUS: 5.5 mg/dL — AB (ref 2.3–4.6)
Potassium: 3.4 mmol/L — ABNORMAL LOW (ref 3.5–5.1)
Sodium: 136 mmol/L (ref 135–145)

## 2014-06-11 LAB — GLUCOSE, CAPILLARY: GLUCOSE-CAPILLARY: 84 mg/dL (ref 70–99)

## 2014-06-11 MED ORDER — FENTANYL BOLUS VIA INFUSION
50.0000 ug | INTRAVENOUS | Status: DC | PRN
  Filled 2014-06-11: qty 200

## 2014-06-11 MED ORDER — ATROPINE SULFATE 1 % OP SOLN
4.0000 [drp] | OPHTHALMIC | Status: DC | PRN
  Filled 2014-06-11: qty 2

## 2014-06-11 MED ORDER — LORAZEPAM 2 MG/ML IJ SOLN
1.0000 mg/h | INTRAVENOUS | Status: DC
  Administered 2014-06-11: 1 mg/h via INTRAVENOUS
  Filled 2014-06-11: qty 25

## 2014-06-11 MED ORDER — SODIUM CHLORIDE 0.9 % IV SOLN
100.0000 ug/h | INTRAVENOUS | Status: DC
  Administered 2014-06-11: 100 ug/h via INTRAVENOUS
  Filled 2014-06-11: qty 50

## 2014-06-11 MED ORDER — LORAZEPAM BOLUS VIA INFUSION
2.0000 mg | INTRAVENOUS | Status: DC | PRN
  Filled 2014-06-11: qty 5

## 2014-06-11 MED ORDER — NOREPINEPHRINE BITARTRATE 1 MG/ML IV SOLN
0.0000 ug/min | INTRAVENOUS | Status: DC
  Administered 2014-06-11: 4 ug/min via INTRAVENOUS
  Filled 2014-06-11: qty 4

## 2014-06-14 LAB — CULTURE, BLOOD (ROUTINE X 2)
CULTURE: NO GROWTH
Culture: NO GROWTH

## 2014-06-15 NOTE — Discharge Summary (Signed)
PULMONARY / CRITICAL CARE MEDICINE  Shelby MoralesSarah A Foster was a 69 yo female admitted 06/13/2014 with asphyxiation on food with respiratory leading to cardiac arrest with about 40 minutes before ROSC.  She was intubated and started on hypothermia protocol.  Cardiology was consulted.  She had EEG done while on hypothermia protocol, and this showed status epilepticus.  She was seen by neurology and started on AED's.  She continued to have GPED's.  Her sedation was discontinued, but she did not regain her mental status and remained in comatose state.  Family agreed that patient would not want to be maintained on life support w/o hope for meaningful neurologic recovery.  She was made DNR, and vent support removed on 2/11.  She expired on 06/23/2014 at 12:42 PM.  Final Diagnoses:  Acute hypoxic respiratory failure 2nd to asphyxiation of food. Respiratory leading to cardiac arrest. Hx of HTN, HLD. Non ischemic CM. Elevated troponin 2nd to demand ischemia. ESRD >> HD T, Th, Sa as outpt.  Shock liver. Protein calorie malnutrition. Hx of GERD. Anemia of chronic disease.  Aspiration PNA. DM type II with renal complications.   Acute encephalopathy after cardiac arrest. Anoxic encephalopathy Status epilepticus noted on EEG 2/08. GPED's. Coma.  [name], MD [facility]monary[facility]Critical [facility] 06/04/2014, 4:01 PM

## 2014-06-22 LAB — GLUCOSE, CAPILLARY
Glucose-Capillary: 92 mg/dL (ref 70–99)
Glucose-Capillary: 89 mg/dL (ref 70–99)
Glucose-Capillary: 87 mg/dL (ref 70–99)

## 2014-06-30 NOTE — Progress Notes (Signed)
PULMONARY / CRITICAL CARE MEDICINE  Shelby MoralesSarah A Foster is a 69 yo female admitted 06/12/2014 with asphyxiation on food with respiratory leading to cardiac arrest with about 40 minutes before ROSC.  She was intubated and started on hypothermia protocol.  Cardiology was consulted.  She had EEG done while on hypothermia protocol, and this showed status epilepticus.  She was seen by neurology and started on AED's.  She continued to have GPED's.  Her sedation was discontinued, but she did not regain her mental status and remained in comatose state.  Family agreed that patient would not want to be maintained on life support w/o hope for meaningful neurologic recovery.  She was made DNR, and vent support removed on 2/11.  SUBJECTIVE:  Comatose  PHYSICAL EXAMINATION: Temp:  [97.3 F (36.3 C)-98.8 F (37.1 C)] 97.3 F (36.3 C) (02/11 0000) Pulse Rate:  [47-61] 47 (02/11 0600) Resp:  [14-43] 43 (02/11 0400) BP: (78-151)/(26-48) 108/34 mmHg (02/11 0600) SpO2:  [92 %-100 %] 100 % (02/11 0600) Arterial Line BP: (89-156)/(28-50) 119/40 mmHg (02/11 0600) FiO2 (%):  [30 %-40 %] 30 % (02/11 0311) Weight:  [119 lb 0.8 oz (54 kg)] 119 lb 0.8 oz (54 kg) (02/11 0311)  General: no distress Neuro: comatose HEENT: ETT in place  Cardiovascular: regular Lungs: scattered rhonchi Abdomen:  Soft, no guarding, 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 Musculoskeletal: fistula Lt arm Skin: no rashes  LABS:  CBC  Recent Labs Lab 06/09/14 0330 06/10/14 0229 2014-09-15 0400  WBC 9.1 10.9* 5.6  HGB 8.0* 8.8* 8.2*  HCT 24.3* 25.6* 24.3*  PLT 251 233 197   Coag's  Recent Labs Lab 06/07/2014 2245 06/07/14 1036  APTT 33  --   INR 1.18 1.20   BMET  Recent Labs Lab 06/10/14 0229 06/10/14 0420 2014-09-15 0400  NA 140 138 136  K 2.9* 3.1* 3.4*  CL 101 101 100  CO2 24 26 26   BUN 26* 23 53*  CREATININE 1.81* 1.91* 3.09*  GLUCOSE 99 89 87   Electrolytes  Recent Labs Lab 06/07/14 0400  06/08/14 0400  06/09/14 0330 06/10/14 0229 06/10/14 0420 2014-09-15 0400  CALCIUM 8.1*  < > 5.8* 7.3* 8.0* 7.8* 7.0*  MG 1.8  --  1.4* 1.9  --   --   --   PHOS 1.9*  --  3.5 5.8* 2.4  --  5.5*  < > = values in this interval not displayed.   Sepsis Markers  Recent Labs Lab 06/28/2014 2300  LATICACIDVEN 1.7   ABG  Recent Labs Lab 06/07/14 0343 06/08/14 0349 06/09/14 1112  PHART 7.496* 7.461* 7.414  PCO2ART 38.2 38.0 38.9  PO2ART 72.0* 146.0* 142.0*   Liver Enzymes  Recent Labs Lab 06/18/2014 1907 06/03/2014 2245 06/08/14 0400 06/09/14 0330 06/10/14 0229 2014-09-15 0400  AST 194* 257* 64*  --   --   --   ALT 190* 204* 85*  --   --   --   ALKPHOS 80 86 50  --   --   --   BILITOT 0.9 1.3* 0.5  --   --   --   ALBUMIN 3.2* 3.3* 2.0* 2.5* 2.6* 2.1*   Cardiac Enzymes  Recent Labs Lab 06/03/2014 2245 06/07/14 0400 06/07/14 1036  TROPONINI 0.38* 0.76* 0.92*   Glucose  Recent Labs Lab 06/09/14 2108 06/09/14 2336 06/10/14 0401 06/10/14 0720 06/10/14 1145 06/10/14 1626  GLUCAP 77 83 86 77 80 75    Imaging Dg Chest Port 1 View  12/01/2014  CLINICAL DATA:  Aspiration pneumonia.  EXAM: PORTABLE CHEST - 1 VIEW  COMPARISON:  06/10/2014.  FINDINGS: Endotracheal tube and NG tube in good anatomic position. Stable heart size with normal pulmonary vascularity. Persistent unchanged left mid lung field left base infiltrate. Mild right base infiltrate. No pleural effusion or pneumothorax.  IMPRESSION: 1. Lines and tubes in stable position. 2. Persistent left mid lung field and left base pulmonary infiltrates. Mild right base infiltrate. No change.   Electronically Signed   By: Maisie Fus  Register   On: Jun 25, 2014 07:11   Dg Chest Port 1 View  06/10/2014   CLINICAL DATA:  Aspiration pneumonia.  EXAM: PORTABLE CHEST - 1 VIEW  COMPARISON:  06/09/2014.  FINDINGS: Endotracheal tube and NG tube in stable position. Stable cardiomegaly. Persistent bibasilar pulmonary alveolar infiltrates. There has been  slight clearing. Small bilateral pleural effusions. No pneumothorax. Left subclavian vascular stent in stable position.  IMPRESSION: 1. Lines and tubes in stable position. 2. Stable cardiomegaly. 3. Persistent bibasilar pulmonary infiltrates/edema with slight clearing. Small bilateral effusions.   Electronically Signed   By: Maisie Fus  Register   On: 06/10/2014 07:26      ASSESSMENT:  Acute hypoxic respiratory failure 2nd to asphyxiation of food. Respiratory leading to cardiac arrest. Hx of HTN, HLD. Non ischemic CM. Elevated troponin 2nd to demand ischemia. ESRD >> HD T, Th, Sa as outpt.  Shock liver. Protein calorie malnutrition. Hx of GERD. Anemia of chronic disease.  Aspiration PNA. DM type II with renal complications.   Acute encephalopathy after cardiac arrest. Anoxic encephalopathy Status epilepticus noted on EEG 2/08. GPED's. Coma.  PLAN:  DNR Proceed with vent withdrawal Comfort measures only Continue AED's for now >> seizures could potential cause discomfort Ativan, fentanyl gtt's PRN tylenol for fever PRN atropine drops for respiratory secretions  Summary: I had detailed d/w pt's husband, 2 daughters, and sister.  Explained that her chances for meaningful neurologic recovery are grim, and that she would not be candidate for long term HD in this situation.  They are in agreement that she would not want to continue on life support under these circumstances.  Process for vent withdrawal discussed.  Also explained that time course for dying process is difficult to predict.  Depending on her status after extubation, might need to consider transfer to floor bed.  Coralyn Helling, MD [facility] Pulmonary/Critical Care 2014-06-25, 8:47 AM Pager:  [phone] After 3pm call: [phone]

## 2014-06-30 NOTE — Progress Notes (Addendum)
200 mL of Fentynal wasted and 30 mL of ativan wasted. Witnessed by second nurse. Shary DecampLatroya Short, RN  C Tyshae Stair RN

## 2014-06-30 NOTE — Progress Notes (Signed)
Family agreed to withdrawal care. Chaplain offered and refused. Drips started verified by second RN. Comfort cart offered and accepted. Emotional support given to family.

## 2014-06-30 NOTE — Progress Notes (Signed)
Time of death 1242 Confirmed with Shary DecampLatroya Dax Murguia, RN and Deretha EmoryMildred Shaw, RN. Family at bedside. Emotional supported provided.

## 2014-06-30 NOTE — Procedures (Signed)
Extubation Procedure Note  Patient Details:   Name: Shelby MoralesSarah A Foster DOB: 05/12/1945 MRN: [id number]   Airway Documentation:     Evaluation  O2 sats: currently acceptable Complications: No apparent complications Patient did tolerate procedure well. Bilateral Breath Sounds: Rhonchi Suctioning: Airway No  Ok AnisKelly Smith, MA 06/01/2014, 10:11 AM   Used withdrawal of life protocol per MD order.

## 2014-06-30 NOTE — Progress Notes (Signed)
Subjective: No overnight events. Patient has been off sedation since 2/08.   LTM shows GPEDs suggesting neurologic injury.    Objective: Current vital signs: BP 108/34 mmHg  Pulse 47  Temp(Src) 97.3 F (36.3 C) (Axillary)  Resp 43  Ht  (1.549 m)  Wt 54 kg (119 lb 0.8 oz)  BMI 22.51 kg/m2  SpO2 100% Vital signs in last 24 hours: Temp:  [97.3 F (36.3 C)-98.8 F (37.1 C)] 97.3 F (36.3 C) (02/11 0000) Pulse Rate:  [47-61] 47 (02/11 0600) Resp:  [14-43] 43 (02/11 0400) BP: (78-151)/(26-48) 108/34 mmHg (02/11 0600) SpO2:  [92 %-100 %] 100 % (02/11 0600) Arterial Line BP: (89-156)/(28-50) 119/40 mmHg (02/11 0600) FiO2 (%):  [30 %-40 %] 30 % (02/11 0311) Weight:  [54 kg (119 lb 0.8 oz)] 54 kg (119 lb 0.8 oz) (02/11 0311)  Intake/Output from previous day: 02/10 0701 - 02/11 0700 In: 1677.3 [I.V.:322.3; NG/GT:990; IV Piggyback:315] Out: -  Intake/Output this shift:   Nutritional status: Diet NPO time specified  Neurologic Exam: Mental Status: Patient does not respond to verbal stimuli. Does not respond to deep sternal rub. Does not follow commands. No verbalizations noted.  Cranial Nerves: II: patient does not respond confrontation bilaterally, pupils right 2 mm, left 2 mm,and non-reactive bilaterally III,IV,VI: doll's response absent bilaterally.  V,VII: corneal reflex absent bilaterally  VIII: patient does not respond to verbal stimuli IX,X: gag reflex absent, XI: trapezius strength unable to test bilaterally XII: tongue strength unable to test Motor: Extremities flaccid throughout. No spontaneous movement noted. Intermittent extension bilateral UE to noxious stimuli Sensory: Does not respond to noxious stimuli in in UE with triple reflex in bilateral LE. Deep Tendon Reflexes:  2+ throughout Plantars: upgoing bilaterally Cerebellar: Unable to perform  Lab Results: Basic Metabolic Panel:  Recent Labs Lab 06-07-14 2245 06/07/14 0400  06/08/14 0400  06/08/14 0524 06/09/14 0330 06/10/14 0229 06/10/14 0420 06/06/2014 0400  NA 142 140  < > 144 140 138 140 138 136  K 4.6 3.4*  < > 2.9* 4.1 4.4 2.9* 3.1* 3.4*  CL 102 101  < > 115* 100 99 101 101 100  CO2 30 30  < > 22  --  GLUCOSE 156* 134*  < > 50* 75 73 99 89 87  BUN 26* 32*  < > 30* 37* 66* 26* 23 53*  CREATININE 3.02* 3.13*  < > 2.99* 4.20* 4.61* 1.81* 1.91* 3.09*  CALCIUM 8.3* 8.1*  < > 5.8*  --  7.3* 8.0* 7.8* 7.0*  MG 2.0 1.8  --  1.4*  --  1.9  --   --   --   PHOS 3.3 1.9*  --  3.5  --  5.8* 2.4  --  5.5*  < > = values in this interval not displayed.  Liver Function Tests:  Recent Labs Lab 06-07-14 1907 06-07-2014 2245 06/08/14 0400 06/09/14 0330 06/10/14 0229 06/07/2014 0400  AST 194* 257* 64*  --   --   --   ALT 190* 204* 85*  --   --   --   ALKPHOS 80 86 50  --   --   --   BILITOT 0.9 1.3* 0.5  --   --   --   PROT 6.6 6.6 4.0*  --   --   --   ALBUMIN 3.2* 3.3* 2.0* 2.5* 2.6* 2.1*   No results for input(s): LIPASE, AMYLASE in the last 168 hours. No results for  input(s): AMMONIA in the last 168 hours.  CBC:  Recent Labs Lab 06/13/2014 1907 06/22/2014 2245  06/08/14 0400 06/08/14 0500 06/08/14 0524 06/09/14 0330 06/10/14 0229 06/24/2014 0400  WBC 16.8* 13.9*  < > 7.9 9.6  --  9.1 10.9* 5.6  NEUTROABS 11.3* 12.5*  --   --   --   --   --   --   --   HGB 10.2* 9.8*  < > 6.7* 8.7* 8.2* 8.0* 8.8* 8.2*  HCT 31.2* 29.1*  < > 20.0* 25.3* 24.0* 24.3* 25.6* 24.3*  MCV 98.7 93.6  < > 93.9 94.1  --  94.9 92.8 95.7  PLT 301 279  < > 152 198  --  251 233 197  < > = values in this interval not displayed.  Cardiac Enzymes:  Recent Labs Lab 06/23/2014 1907 06/02/2014 2245 06/07/14 0400 06/07/14 1036  TROPONINI 0.09* 0.38* 0.76* 0.92*    Lipid Panel:  Recent Labs Lab 06/07/14 0300 06/10/14 0229  TRIG 29 137    CBG:  Recent Labs Lab 06/09/14 2336 06/10/14 0401 06/10/14 0720 06/10/14 1145 06/10/14 1626  GLUCAP 83 86 77 80 75     Microbiology: Results for orders placed or performed during the hospital encounter of 06/04/2014  Culture, blood (routine x 2)     Status: None (Preliminary result)   Collection Time: 06/07/14 12:25 AM  Result Value Ref Range Status   Specimen Description BLOOD CENTRAL LINE  Final   Special Requests BOTTLES DRAWN AEROBIC AND ANAEROBIC 10CC EA  Final   Culture   Final           BLOOD CULTURE RECEIVED NO GROWTH TO DATE CULTURE WILL BE HELD FOR 5 DAYS BEFORE ISSUING A FINAL NEGATIVE REPORT Note: Culture results may be compromised due to an excessive volume of blood received in culture bottles. Performed at [facility]    Report Status PENDING  Incomplete  Culture, blood (routine x 2)     Status: None (Preliminary result)   Collection Time: 06/07/14 12:27 AM  Result Value Ref Range Status   Specimen Description BLOOD RIGHT ARM  Final   Special Requests BOTTLES DRAWN AEROBIC ONLY 2CC  Final   Culture   Final           BLOOD CULTURE RECEIVED NO GROWTH TO DATE CULTURE WILL BE HELD FOR 5 DAYS BEFORE ISSUING A FINAL NEGATIVE REPORT Performed at [facility]    Report Status PENDING  Incomplete  MRSA PCR Screening     Status: None   Collection Time: 06/07/14  1:42 AM  Result Value Ref Range Status   MRSA by PCR NEGATIVE NEGATIVE Final    Comment:        The GeneXpert MRSA Assay (FDA approved for NASAL specimens only), is one component of a comprehensive MRSA colonization surveillance program. It is not intended to diagnose MRSA infection nor to guide or monitor treatment for MRSA infections.   Culture, respiratory (tracheal aspirate)     Status: None   Collection Time: 06/07/14  4:00 AM  Result Value Ref Range Status   Specimen Description TRACHEAL ASPIRATE  Final   Special Requests NONE  Final   Gram Stain   Final    RARE WBC PRESENT, PREDOMINANTLY PMN NO SQUAMOUS EPITHELIAL CELLS SEEN FEW GRAM POSITIVE COCCI IN PAIRS IN CHAINS Performed at [facility]    Culture   Final    Non-Pathogenic Oropharyngeal-type Flora Isolated. Performed at [facility]    Report Status 06/09/2014 FINAL  Final  Urine culture     Status: None   Collection Time: 06/07/14 10:29 AM  Result Value Ref Range Status   Specimen Description URINE, CATHETERIZED  Final   Special Requests NONE  Final   Colony Count NO GROWTH Performed at [facility]   Final   Culture NO GROWTH Performed at [facility]   Final   Report Status 06/09/2014 FINAL  Final    Coagulation Studies: No results for input(s): LABPROT, INR in the last 72 hours.  Imaging: Dg Chest Port 1 View  06/20/2014   CLINICAL DATA:  Aspiration pneumonia.  EXAM: PORTABLE CHEST - 1 VIEW  COMPARISON:  06/10/2014.  FINDINGS: Endotracheal tube and NG tube in good anatomic position. Stable heart size with normal pulmonary vascularity. Persistent unchanged left mid lung field left base infiltrate. Mild right base infiltrate. No pleural effusion or pneumothorax.  IMPRESSION: 1. Lines and tubes in stable position. 2. Persistent left mid lung field and left base pulmonary infiltrates. Mild right base infiltrate. No change.   Electronically Signed   By: Maisie Fushomas  Register   On: 06/20/2014 07:11   Dg Chest Port 1 View  06/10/2014   CLINICAL DATA:  Aspiration pneumonia.  EXAM: PORTABLE CHEST - 1 VIEW  COMPARISON:  06/09/2014.  FINDINGS: Endotracheal tube and NG tube in stable position. Stable cardiomegaly. Persistent bibasilar pulmonary alveolar infiltrates. There has been slight clearing. Small bilateral pleural effusions. No pneumothorax. Left subclavian vascular stent in stable position.  IMPRESSION: 1. Lines and tubes in stable position. 2. Stable cardiomegaly. 3. Persistent bibasilar pulmonary infiltrates/edema with slight clearing. Small bilateral effusions.   Electronically Signed   By: Maisie Fushomas  Register   On: 06/10/2014 07:26    Medications:  Scheduled: . ampicillin-sulbactam  (UNASYN) IV  1.5 g Intravenous Q24H  . antiseptic oral rinse  7 mL Mouth Rinse QID  . chlorhexidine  15 mL Mouth Rinse BID  . darbepoetin (ARANESP) injection - DIALYSIS  40 mcg Intravenous Q Thu-HD  . feeding supplement (PRO-STAT SUGAR FREE 64)  30 mL Per Tube QID  . feeding supplement (VITAL HIGH PROTEIN)  1,000 mL Per Tube Q24H  . heparin  5,000 Units Subcutaneous 3 times per day  . insulin aspart  0-9 Units Subcutaneous 6 times per day  . levETIRAcetam  1,000 mg Intravenous Q24H  . pantoprazole sodium  40 mg Per Tube Q24H  . sodium chloride  10-40 mL Intracatheter Q12H  . valproate sodium  500 mg Intravenous TID    Assessment/Plan:  69 YO female hx of DM, HTN, ESRD on HD, S/P cardiac arrest with prolonged ROSC (>40 minutes). During cooling phase had initial EEG which showed patient to be in status epilepticus. SE resolved with VPA and keppra but repeat EEG continues to show GPEDs. Continue VPA and keppra. Unfortunately poor prognosis for meaningful neurological recovery based on clinical history and exam. Will sign off at this time, please call with further questions.    LOS: 5 days   Elspeth Choeter Delman Goshorn, DO [facility] [phone]  If 7pm- 7am, please page neurology on call as listed in AMION. 06/23/2014  7:22 AM

## 2014-06-30 NOTE — Progress Notes (Signed)
   06/02/2014 1000  Clinical Encounter Type  Visited With Patient and family together;Health care provider  Visit Type Spiritual support;Social support;Patient actively dying  Spiritual Encounters  Spiritual Needs Grief support  Stress Factors  Family Stress Factors Loss   Chaplain was referred to patient via spiritual care consult. Consult indicated that the patient was actively dying. Chaplain introduced himself to the family members present with the patient. Patient's family members seem to be supporting each other well. Patient's nurse indicated that the patient's family has contacted their own pastor to provide support later today. Chaplain made family aware that they can contact him to return if needed. Chaplain will continue to provide grief support for patient's family as needed. Ali Mclaurin, Tommi EmeryBlake R, Chaplain  10:14 AM

## 2014-06-30 DEATH — deceased

## 2014-08-22 NOTE — Op Note (Signed)
PATIENT NAME:  [name], [name] MR#:  [id number] DATE OF BIRTH:  24-Sep-1945  DATE OF PROCEDURE:  04/03/2014  PREOPERATIVE DIAGNOSES:  1.  Complication of arteriovenous dialysis device with prolonged bleeding following dialysis.  2.  End-stage renal disease requiring hemodialysis.   POSTOPERATIVE DIAGNOSES::  1.  Complication of arteriovenous dialysis device with prolonged bleeding following dialysis.  2.  End-stage renal disease requiring hemodialysis.   PROCEDURES PERFORMED:  1.  Contrast injection, left arm, brachial axillary dialysis graft.  2.  Percutaneous transluminal angioplasty of the midportion of the AV graft to 6 mm.  3.  Percutaneous transluminal angioplasty and stent placement of the axillary vein and venous outflow of the AV graft, separate and distinct lesion, dilated to 8 mm.   SURGEON: Renford Dills, MD  SEDATION: Fentanyl 100 mcg.   MONITORING: Continuous ECG, pulse oximetry, and cardiopulmonary monitoring was performed throughout the entire procedure by the interventional radiology nurse.   TOTAL SEDATION TIME: 40 minutes.   ACCESS: A 7 French sheath, antegrade direction, left arm brachial axillary dialysis graft.   CONTRAST USED: Isovue 25 mL.   FLUOROSCOPY TIME: 3.3 minutes.   INDICATIONS: [name] is a 69 year old woman who presents with increased bleeding and difficulty with her dialysis. 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 Risks and benefits were reviewed with the family. All questions were answered. All were in agreement with proceeding.   DESCRIPTION OF PROCEDURE: The patient is taken to special procedures and placed in the supine position. After adequate sedation is achieved, she is positioned supine with her left arm extended, palm upward. The left arm is prepped and  draped in sterile fashion. Appropriate timeout is called.   One percent lidocaine is infiltrated in the soft tissues near the arterial anastomosis in an antegrade direction. A micropuncture needle is inserted, microwire, followed by micro sheath, J-wire followed by a 6 French sheath. Hand injection of contrast was then used to demonstrate the entire AV graft, as well as the central veins. After review of these images, 3000 units of heparin was given and A Magic torque wire is advanced across both lesions. A 6 x 6 Lutonix balloon is then advanced across k distal lesion in the axillary vein, extending into the previously placed stent and inflated to 14 atmospheres for approximately 2 minutes. The balloon is then deflated, and repositioned in the midportion of the graft, and inflated to 14 atmospheres for approximately 1 minute.   The 6 French sheath is then removed, and an 8 x 40 FLAIR stent is advanced over the wire, across the axillary vein venous lesion. It is then deployed, extending back into the existing stent without difficulty. Subsequently, a 7 x 4 balloon is used to post dilate this. However, this appears to be somewhat undersized, and an 8 x 6 balloon is then used to dilate the flared stent. Followup imaging now demonstrates an excellent result, with a widely patent venous outflow. Midportion of the AV graft is also reimaged and noted to have less than 5% residual stenosis.   Pursestring suture is placed. Sheath is removed. There are no immediate complications.   INTERPRETATION: Initial views of the graft demonstrate there is a stenosis stricture in its midportion. Otherwise, the graft appears to be quite suitable at the venous outflow in the axillary vein, in the most proximal portion of  a previously placed stent. There is a string sign. The remaining portions of the subclavian, innominate and superior vena cava are widely patent. Arterial anastomosis is patent.   Following angioplasty and stent  placement in the venous outflow. There is now wide patency. Following angioplasty in the midportion of the graft, there is now less than 5% residual stenosis.   SUMMARY: Successful salvage of left arm brachial axillary dialysis graft.    ____________________________ Renford DillsGregory G. Schnier, MD ggs:MT D: 04/03/2014 14:58:21 ET T: 04/03/2014 15:16:58 ET JOB#: [id number]  cc: Renford DillsGregory G. Schnier, MD, <Dictator> Salomon MastBelayenh Befekadu, MD Renford DillsGREGORY G SCHNIER MD ELECTRONICALLY SIGNED 04/04/2014 16:40

## 2014-08-22 NOTE — H&P (Signed)
PATIENT NAME:  [name], [name] MR#:  [id number] DATE OF BIRTH:  Sep 08, 1945  DATE OF ADMISSION:  04/03/2014  PRIMARY CARE PHYSICIAN: Nonlocal.   REASON FOR COMING IN: The patient was coming in for an elective procedure on her left arm graft having problems with bleeding afterwards. Dr. Gilda Crease was going to do an angiogram here in the vascular lab. Prior to the procedure, nurses thought she was acting different with decreased speech and slurred speech, not oriented, confused. Hospitalist services were contacted for a suspected stroke.   HISTORY OF PRESENT ILLNESS: This is a 69 year old female with end-stage renal disease on dialysis at [facility] on Tuesday, Thursday, Saturday, diabetes and hypertension. She was complaining of some of left-sided weakness and does have some slurred speech. The nursing staff did call the patient this morning, but apparently it was a family member that answered to her name and was very different on the phone because it was a different person. Because of this and how they saw the patient that she needed help to get out of the chair into the stretcher and could not get into the stretcher at home and patient had some left-sided weakness, a suspected stroke was thought and the hospitalist services were contacted for further evaluation. Family does think that this is the patient's baseline. The patient does have some left-sided weakness, slurred speech. Hospitalist services were contacted for further evaluation.   PAST MEDICAL HISTORY: End-stage renal disease [facility], Tuesday, Thursday, Saturday, diabetes, and hypertension.   PAST SURGICAL HISTORY: Graft on the left arm.   ALLERGIES: No known drug allergies.   MEDICATIONS: Include Coreg 12.5 mg twice a day, hydralazine 50 mg every 8 hours, Imdur 60 mg at bedtime, meclizine 12.5 mg every 4 hours as needed, Norco 5/325 one tablet every 6 hours as needed, Protonix 40 mg once a day in the a.m.   SOCIAL HISTORY:  No smoking. No alcohol. No drug use. Lives with the family. Used to work in [employer].   FAMILY HISTORY: Unable to obtain at this time. Family members do not know and the patient unable to tell me.   REVIEW OF SYSTEMS: CONSTITUTIONAL: No fever or chills.  EYES: No blurry vision.  EARS, NOSE, MOUTH AND THROAT: No hearing loss. No sore throat. No difficulty swallowing.  CARDIOVASCULAR: No chest pain or palpitations.  RESPIRATORY: No shortness of breath. No cough.  GASTROINTESTINAL: No nausea. No vomiting. No abdominal pain. No diarrhea. No constipation.  GENITOURINARY: No burning on urination or hematuria.  MUSCULOSKELETAL: No joint pain or muscle pain.  INTEGUMENT: No rashes or eruptions.  NEUROLOGIC: Some left-sided weakness and slurred speech.  PSYCHIATRIC: No anxiety or depression.  ENDOCRINE: No thyroid problems.  HEMATOLOGIC AND LYMPHATIC: No anemia. No easy bruising or bleeding.   PHYSICAL EXAMINATION: VITAL SIGNS: Temperature afebrile. Blood pressure 125/85, pulse 65, respirations 14.  GENERAL: No respiratory distress, lying flat in bed.  EYES: Conjunctivae and lids normal. Pupils equal, round, and reactive to light. Extraocular muscles intact. No nystagmus.  EARS, NOSE, MOUTH AND THROAT: Tympanic membranes are not visualized. Nasal mucosa: No erythema. Throat: No erythema. No exudate seen. Lips and gums: No lesions.  NECK: No JVD. No bruits. No lymphadenopathy. No thyromegaly. No thyroid nodules palpated.  RESPIRATORY: Lungs clear to auscultation. No use of accessory muscles to breathe. No rhonchi, rales, or wheeze heard.  CARDIOVASCULAR SYSTEM: S1, S2 normal. Positive 4/6 systolic ejection murmur. Carotid upstroke 2+ bilaterally. No bruises. Dorsalis pedis pulses 2+ bilaterally. No edema  of the lower extremities.  ABDOMEN: Soft, nontender. No organomegaly/splenomegaly. Normoactive bowel sounds. No masses felt.  LYMPHATIC: No lymph nodes in the neck.  MUSCULOSKELETAL: No clubbing,  edema, or cyanosis.  SKIN: Skin tears on the upper extremities. Petechiae on the upper extremities.  NEUROLOGIC: Cranial nerves II through XII are grossly intact except for slurred speech. Left-sided 4/5 weakness upper and lower extremities. Reflexes 1+ bilateral lower extremities.  PSYCHIATRIC: The patient is oriented to person, place.   ASSESSMENT AND PLAN: 1.  Suspected cerebrovascular accident with left-sided weakness and slurred speech. The family does think that this is her usual baseline and normally walks with a walker or travels by wheelchair. I will get a CT scan of the head stat. I will speak with Dr. Gilda CreaseSchnier about his comfort level with this procedure on whether the CAT scan is negative or not. We potentially could need an MRI of the brain. If the CAT scan negative, I will give an aspirin just in case. I will get PT and speech to see the patient.  2.  Hypertension. Blood pressure is stable on usual medications.  3.  Diabetes, not on any medications for this.  4.  Gastroesophageal reflux disease, on Protonix.  5.  End-stage renal disease, on dialysis on Tuesday, Thursday, and Saturday. Will end up needing dialysis here if we do keep her overnight.  6.  I will get some admission labs. So far what is back is a TSH of 2.53, glucose 95, BUN 59, creatinine 5.36, sodium 140, potassium 3.9, chloride 101, CO2 of 27, calcium 8.0. Liver function tests: Albumin low at 3.3, AST low at 14. CBC still pending and CAT scan of the head pending.   TIME SPENT ON ADMISSION: 50 minutes.   CODE STATUS: The patient is a full code.    ____________________________ Herschell Dimesichard J. Renae GlossWieting, MD rjw:at D: 04/03/2014 13:11:19 ET T: 04/03/2014 13:45:47 ET JOB#: [id number]  cc: Herschell Dimesichard J. Renae GlossWieting, MD, <Dictator> Salley ScarletICHARD J Brylon Brenning MD ELECTRONICALLY SIGNED 04/11/2014 12:24
